# Patient Record
Sex: Female | Born: 1955 | Race: White | Hispanic: No | Marital: Single | Smoking: Never smoker
Health system: Southern US, Community
[De-identification: ages and names within clinical notes are randomized; demographics above are authoritative.]

## PROBLEM LIST (undated history)

## (undated) DIAGNOSIS — K5900 Constipation, unspecified: Secondary | ICD-10-CM

## (undated) DIAGNOSIS — M48061 Spinal stenosis, lumbar region without neurogenic claudication: Secondary | ICD-10-CM

## (undated) DIAGNOSIS — L719 Rosacea, unspecified: Secondary | ICD-10-CM

## (undated) DIAGNOSIS — F410 Panic disorder [episodic paroxysmal anxiety] without agoraphobia: Secondary | ICD-10-CM

## (undated) DIAGNOSIS — M47812 Spondylosis without myelopathy or radiculopathy, cervical region: Secondary | ICD-10-CM

## (undated) DIAGNOSIS — E785 Hyperlipidemia, unspecified: Secondary | ICD-10-CM

## (undated) DIAGNOSIS — E610 Copper deficiency: Secondary | ICD-10-CM

## (undated) DIAGNOSIS — K6389 Other specified diseases of intestine: Secondary | ICD-10-CM

## (undated) DIAGNOSIS — G43909 Migraine, unspecified, not intractable, without status migrainosus: Secondary | ICD-10-CM

## (undated) DIAGNOSIS — S92902A Unspecified fracture of left foot, initial encounter for closed fracture: Secondary | ICD-10-CM

## (undated) DIAGNOSIS — N882 Stricture and stenosis of cervix uteri: Secondary | ICD-10-CM

## (undated) DIAGNOSIS — M797 Fibromyalgia: Secondary | ICD-10-CM

## (undated) DIAGNOSIS — E039 Hypothyroidism, unspecified: Secondary | ICD-10-CM

## (undated) DIAGNOSIS — F32A Depression, unspecified: Secondary | ICD-10-CM

## (undated) DIAGNOSIS — D649 Anemia, unspecified: Secondary | ICD-10-CM

## (undated) DIAGNOSIS — Z87442 Personal history of urinary calculi: Secondary | ICD-10-CM

## (undated) DIAGNOSIS — N200 Calculus of kidney: Secondary | ICD-10-CM

## (undated) DIAGNOSIS — M1990 Unspecified osteoarthritis, unspecified site: Secondary | ICD-10-CM

## (undated) DIAGNOSIS — F419 Anxiety disorder, unspecified: Secondary | ICD-10-CM

## (undated) DIAGNOSIS — R42 Dizziness and giddiness: Secondary | ICD-10-CM

## (undated) DIAGNOSIS — F329 Major depressive disorder, single episode, unspecified: Secondary | ICD-10-CM

## (undated) DIAGNOSIS — K449 Diaphragmatic hernia without obstruction or gangrene: Secondary | ICD-10-CM

## (undated) DIAGNOSIS — K219 Gastro-esophageal reflux disease without esophagitis: Secondary | ICD-10-CM

## (undated) DIAGNOSIS — E7800 Pure hypercholesterolemia, unspecified: Secondary | ICD-10-CM

## (undated) HISTORY — DX: Calculus of kidney: N20.0

## (undated) HISTORY — DX: Gastro-esophageal reflux disease without esophagitis: K21.9

## (undated) HISTORY — DX: Migraine, unspecified, not intractable, without status migrainosus: G43.909

## (undated) HISTORY — PX: DILATION AND CURETTAGE OF UTERUS: SHX78

## (undated) HISTORY — DX: Fibromyalgia: M79.7

## (undated) HISTORY — DX: Panic disorder (episodic paroxysmal anxiety): F41.0

## (undated) HISTORY — DX: Constipation, unspecified: K59.00

## (undated) HISTORY — DX: Hypothyroidism, unspecified: E03.9

## (undated) HISTORY — DX: Spondylosis without myelopathy or radiculopathy, cervical region: M47.812

## (undated) HISTORY — DX: Stricture and stenosis of cervix uteri: N88.2

## (undated) HISTORY — DX: Pure hypercholesterolemia, unspecified: E78.00

## (undated) HISTORY — DX: Spinal stenosis, lumbar region without neurogenic claudication: M48.061

## (undated) HISTORY — DX: Anxiety disorder, unspecified: F41.9

## (undated) HISTORY — PX: NISSEN FUNDOPLICATION: SHX2091

## (undated) HISTORY — DX: Depression, unspecified: F32.A

## (undated) HISTORY — DX: Dizziness and giddiness: R42

## (undated) HISTORY — DX: Other specified diseases of intestine: K63.89

## (undated) HISTORY — DX: Rosacea, unspecified: L71.9

## (undated) HISTORY — PX: ABDOMINAL HYSTERECTOMY: SHX81

## (undated) HISTORY — DX: Unspecified fracture of left foot, initial encounter for closed fracture: S92.902A

## (undated) HISTORY — PX: EYE SURGERY: SHX253

## (undated) HISTORY — DX: Major depressive disorder, single episode, unspecified: F32.9

## (undated) HISTORY — PX: BONE MARROW BIOPSY: SHX199

## (undated) HISTORY — DX: Hyperlipidemia, unspecified: E78.5

## (undated) HISTORY — PX: CHOLECYSTECTOMY: SHX55

## (undated) HISTORY — PX: BREAST SURGERY: SHX581

## (undated) HISTORY — DX: Diaphragmatic hernia without obstruction or gangrene: K44.9

---

## 2000-12-26 ENCOUNTER — Ambulatory Visit (HOSPITAL_COMMUNITY): Admission: RE | Admit: 2000-12-26 | Discharge: 2000-12-26 | Payer: Self-pay | Admitting: Internal Medicine

## 2001-12-31 ENCOUNTER — Ambulatory Visit (HOSPITAL_COMMUNITY): Admission: RE | Admit: 2001-12-31 | Discharge: 2001-12-31 | Payer: Self-pay | Admitting: Internal Medicine

## 2005-09-04 ENCOUNTER — Ambulatory Visit: Payer: Self-pay | Admitting: Internal Medicine

## 2005-09-04 ENCOUNTER — Ambulatory Visit (HOSPITAL_COMMUNITY): Admission: RE | Admit: 2005-09-04 | Discharge: 2005-09-04 | Payer: Self-pay | Admitting: Internal Medicine

## 2005-09-14 ENCOUNTER — Ambulatory Visit: Payer: Self-pay | Admitting: Internal Medicine

## 2005-09-14 ENCOUNTER — Ambulatory Visit (HOSPITAL_COMMUNITY): Admission: RE | Admit: 2005-09-14 | Discharge: 2005-09-14 | Payer: Self-pay | Admitting: Internal Medicine

## 2005-09-20 ENCOUNTER — Ambulatory Visit: Payer: Self-pay | Admitting: Internal Medicine

## 2005-10-11 ENCOUNTER — Ambulatory Visit: Payer: Self-pay | Admitting: Internal Medicine

## 2005-10-12 ENCOUNTER — Ambulatory Visit (HOSPITAL_COMMUNITY): Admission: RE | Admit: 2005-10-12 | Discharge: 2005-10-12 | Payer: Self-pay | Admitting: Internal Medicine

## 2005-11-14 ENCOUNTER — Ambulatory Visit: Payer: Self-pay | Admitting: Internal Medicine

## 2006-09-03 ENCOUNTER — Ambulatory Visit (HOSPITAL_COMMUNITY): Admission: RE | Admit: 2006-09-03 | Discharge: 2006-09-03 | Payer: Self-pay | Admitting: Urology

## 2010-08-15 NOTE — H&P (Signed)
NAME:  Danielle Byrd, Danielle Byrd              ACCOUNT NO.:  [id number]   MEDICAL RECORD NO.:  [id number]          PATIENT TYPE:  AMB   LOCATION:  DAY                           FACILITY:  [facility]   PHYSICIAN:  Ky Barban, M.D.DATE OF BIRTH:  Oct 15, 1955   DATE OF ADMISSION:  09/03/2006  DATE OF DISCHARGE:  LH                              HISTORY & PHYSICAL   This patient is coming tomorrow as outpatient to have stone basket done.   CHIEF COMPLAINT:  Recurrent right flank pain.   HISTORY OF PRESENT ILLNESS:  This 55 year old female was seen by me  first on May 7 with recurrent right inguinal colic which started about 2-  1/2 weeks before she came to see me.  CT scan which was done on May 6  showed that she had a 4 mm stone in the right ureterovesical junction  causing some partial obstruction.  She also has a nonobstructing 2 to 3  mm stone in the left kidney.  She continued to have this pain on and off  on the right side and when she was seen by me again on May 28th she had  not passed a stone.  Although she was not having any pain on that day,  wanted me to go ahead and remove the stone.  She is still having some  backache and sometimes it feels like on the left side.  I told her that  it is confusing.  I cannot tell which side she is hurting - right or  left.  If the stone is still there, we may just go ahead and remove it.  I explained to her the procedure of stone basket holmium laser  lithotripsy.  She understands.  I also explained to her the  complications of ureteral perforation leading to open surgery of stone  migration so she is coming as outpatient and will remove the stone from  the left ureter if it is still there.  Past, is seizure one year ago,  hysterectomy 1997 for endometriosis.  No hypertension or diabetes.   PERSONAL HISTORY:  Does not smoke or drink.   REVIEW OF SYSTEMS:  Unremarkable.   PHYSICAL EXAMINATION:  GENERAL:  A well-nourished, well-developed  female.  VITAL SIGNS:  Blood pressure 102/67, temperature 98.2.  CENTRAL NERVOUS SYSTEM:  Negative.  HEENT:  Negative.  CHEST:  Symmetrical.  HEART:  Regular sinus rhythm.  No murmur.  ABDOMEN:  Soft, flat.  Liver, spleen, and kidneys are not palpable.  BACK:  No CV tenderness.  PELVIC:  No adnexal mass or tenderness.   IMPRESSION:  Left ureteral, right renal calculus.   PLAN:  Stone basket holmium laser lithotripsy with double-J stent on the  right side under anesthesia as outpatient.      Ky Barban, M.D.  Electronically Signed     MIJ/MEDQ  D:  09/02/2006  T:  09/02/2006  Job:  [id number]

## 2010-08-15 NOTE — Op Note (Signed)
NAME:  Danielle Byrd, Danielle Byrd              ACCOUNT NO.:  [id number]   MEDICAL RECORD NO.:  [id number]          PATIENT TYPE:  AMB   LOCATION:  DAY                           FACILITY:  [facility]   PHYSICIAN:  Ky Barban, M.D.DATE OF BIRTH:  02/13/1956   DATE OF PROCEDURE:  DATE OF DISCHARGE:                               OPERATIVE REPORT   PREOPERATIVE DIAGNOSES:  1. Right renal colic.  2. Right ureteral calculus.   POSTOPERATIVE DIAGNOSES:  1. No right ureteral calculus.  2. Urethral stenosis.   PROCEDURE:  1. Cystoscopy.  2. Dilation.  3. Right retrograde pyelogram.  4. Right ureteroscopy.   ANESTHESIA:  General.   PROCEDURE:  The patient was placed in the lithotomy position.  She was  prepped and draped.  A #25 cystoscope was attempted to be introduced but  she has urethral stenosis.  We dilated her to 22 [place].  The cystoscope  was introduced and the bladder was inspected and looks normal.  The  right ureteral orifice was catheterized with a wedge catheter.  Hypaque  was injected under fluoroscopic control. The dye went up into the upper  ureter but I do not see any definite stone.  There is some dilatation of  the lower ureter, so I decided to look into the ureter to make sure  there was no stone.  The guidewire was passed up into the renal pelvis.  Over the guidewire, a short rigid ureteroscope was introduced without  any difficulty.  The distal third and the right ureterovesical junction  were inspected.  No stone was seen.  All the instruments were removed.  The patient left the operating room in satisfactory condition.      Ky Barban, M.D.  Electronically Signed     MIJ/MEDQ  D:  09/03/2006  T:  09/03/2006  Job:  [id number]

## 2010-08-18 NOTE — Consult Note (Signed)
Danielle Byrd, Danielle Byrd              ACCOUNT NO.:  [id number]   MEDICAL RECORD NO.:  [id number]          PATIENT TYPE:  AMB   LOCATION:  DAY                           FACILITY:  [facility]   PHYSICIAN:  Lionel December, M.D.    DATE OF BIRTH:  10/17/1955   DATE OF CONSULTATION:  09/04/2005  DATE OF DISCHARGE:                                   CONSULTATION   REASON FOR CONSULTATION:  Possible choledocholithiasis.   HISTORY OF PRESENT ILLNESS:  Danielle Byrd is a 55 year old Caucasian female who is  referred through the courtesy of Dr. Erskine Speed of [place], [place].  Patient had laparoscopic cholecystectomy on Aug 24, 2005.  At that time she  presented with fever, rigors, neck and shoulder pain.  On CT, she was found  to have choledocholithiasis or perhaps evidence of acute cholecystitis.  She  had cholecystectomy on Aug 24, 2005, and was discharged on Aug 25, 2005.  She did well for a few days.  Six days ago she experienced an episode of  severe right upper quadrant pain associated with nausea.  She called Dr.  Brayton Caves office, she was asked to double up on her pain medicines.  She had  a few more episodes over the next day or two.  She was seen in the emergency  room at [facility] and had CT abdomen which revealed a small amount of fluid  collection in the gallbladder fossa.  She subsequently had hepatobiliary  scan which was negative for biliary leak.  Patient was discharged but she  kept having these episodes.  With the next episode, she was taken over to  [facility].  She had another CT.  Since they did not  have availability of MRCP, she was transferred to [facility] under care of Dr. Franky Macho who was covering for Dr. Gabriel Cirri.  Patient had MRCP yesterday which  showed left adrenal adenoma or myelolipoma, postoperative changes in  subcutaneous fat on the right side, a small amount of fluid collection in  gallbladder fossa and a filling defect in the distal bile duct indicative  of  a clot, debris or stone.  With this information, patient was referred for  ERCP.  She has had periodic lab studies.  Her LFTs from September 01, 2005,  revealed AST of 42, ALT of 60, her alkaline phosphatase was 303, bilirubin  was 0.6.  Her CBC was normal with a WBC of 6.2, H&H of 11.8 and 34, platelet  count was 274.   She presently is complaining of soreness of right upper quadrant.  She  states she has had 9 episodes all together.  She has not been able to eat  and has lost 6-8 pounds in the last 10 days.   Her usual meds at home include:  1.  Zyrtec 10 mg daily.  2.  Premarin 0.625 mg daily.  3.  Percocet one q.i.d. p.r.n.   PAST MEDICAL HISTORY:  1.  Chronic __________ 10 years ago by Dr. Erskine Speed.  2.  She had chronic low back pain presumably secondary to endometriosis.  3.  She  is status post hysterectomy.  4.  She also had bilateral eyelid surgery for entropion.  5.  She had normal colonoscopy 4 years ago.  6.  Recent laparoscopic cholecystectomy, as above.   ALLERGIES:  1.  ASPIRIN.  2.  PENICILLIN.  3.  CODEINE.  4.  VICODIN.   FAMILY HISTORY:  Father is 34 and has CAD and diabetes mellitus.  Mother in  84's, in good health.  She has one sister in good health.   SOCIAL HISTORY:  She is single.  She has never married and does not have any  children.  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   PHYSICAL EXAM:  A pleasant, well-developed, well-nourished Caucasian female  who does not appear to be in any acute distress.  She weighs 125 pounds.  She is 5 feet 5-1/2 inches tall.  Pulse 72 per minute, blood pressure  125/72, temperature is 97, respiratory rate is 20.  Conjunctiva is pink,  sclera is nonicteric.  Oropharyngeal mucosa is normal.  No neck masses or  thyromegaly noted.  CARDIAC EXAM:  With a regular rhythm, normal S1 and S2.  No murmur or gallop  noted.  LUNGS:  Are  clear to auscultation.  ABDOMEN:  Is symmetrical, she has some bruising around laparoscopy scars.  Bowel sounds are normal.  On palpation, soft abdomen.  She has mild  tenderness of right upper quadrant but deep palpation was not performed.  No  organomegaly or masses noted.  RECTAL EXAM:  Is deferred.  No clubbing or edema noted.   CBC from this morning:  WBC is 4.9, H&H 11 and 32, platelet count is 296K.  Her electrolytes are normal, potassium is 3.8.   LFTs:  Bilirubin is 0.6, AP 213, AST 22, ALT 28, total protein 5.7 with  albumin of 9.9, INR 1.2 and PTT is 29.1.   ASSESSMENT:  Danielle Byrd is a 55 year old Caucasian female with recurrent right  upper quadrant pain with elevated alkaline phosphatase who also had mildly  elevated transaminases but they are down now.  She is status post lap chole  on Aug 24, 2005.  MRCP shows a filling defect in distal bile duct.  This is  most likely a stone but could be sludge or a clot.  Nevertheless, her  presentation warrants further evaluation with ERCP and sphincterotomy.   RECOMMENDATIONS:  ERCP with sphincterotomy and stone and debris extraction.  I have reviewed the procedure risk with the patient and she is agreeable.   She will be typed and crossmatched for 2 units and will be given a single  dose of Levaquin 500 mg IV.   Hopefully she will be able to go home this afternoon.   We would like to thank Dr. Gabriel Cirri for the opportunity to participate in the  care of this nice lady.      Lionel December, M.D.  Electronically Signed     NR/MEDQ  D:  09/04/2005  T:  09/04/2005  Job:  [id number]

## 2010-08-18 NOTE — Op Note (Signed)
NAME:  Danielle Byrd, Danielle Byrd              ACCOUNT NO.:  [id number]   MEDICAL RECORD NO.:  [id number]          PATIENT TYPE:  AMB   LOCATION:  DAY                           FACILITY:  [facility]   PHYSICIAN:  Lionel December, M.D.    DATE OF BIRTH:  May 09, 1955   DATE OF PROCEDURE:  DATE OF DISCHARGE:                                 OPERATIVE REPORT   PROCEDURE:  ERCP with sphincterotomy and stone extraction.   INDICATION:  [name] is 55 year old Caucasian female who is status post lap  chole on 08/24/2005 and has been experiencing episodic right upper quadrant  pain.  MRCP reveals one, possibly two stones in CBD.  She is here for  therapeutic procedure.  Procedure risks were reviewed with the patient,  informed consent was obtained.   PREOP MEDICATION:  Please see anesthesia record for details.   FINDINGS:  Procedure performed in OR.  After the patient was placed under  anesthesia, she was intubated and positioned in semi prone position.  Olympus video duodenoscope was passed via oropharynx without any difficulty  into esophagus, stomach, across the pylorus into bulb and descending  duodenum.  Ampulla of Vater was normal.  CBD was selectively cannulated  using RX 44 autotome and 0.035 Hydra Jagwire.  Pancreatic duct was not  filled or cannulated.  Dilute contrast was injected into the bile duct.  There were two large filling defects at the distal CBD.  There was no  extravasation of contrast from the cystic duct remnant.  Intrahepatic  radicals were normal.  CBD and CHD measured 6 to 7 mm.  Sphincterotomy was  performed.  Dormia basket was passed into bile duct but would not open up  fully.  Therefore the stones were retrieved using balloon.  Pictures taken  for the record.  Endoscope was withdrawn.  The patient tolerated the  procedure well and she is in the process of being extubated.   FINAL DIAGNOSIS:  Two 5-6 mm stones  removed from common bile duct following  endoscopic sphincterotomy.   PD was not studied.   PLAN:  Clear liquids when she is alert.  She possibly will be going home  this afternoon.   She is to follow with Dr. Gabriel Cirri next week when she will have LFTs.      Lionel December, M.D.  Electronically Signed    NR/MEDQ  D:  09/04/2005  T:  09/05/2005  Job:  [id number]   cc:   Erskine Speed, M.D.  Fax: [phone]

## 2010-08-18 NOTE — Op Note (Signed)
   NAME:  Danielle Byrd, Danielle Byrd                       ACCOUNT NO.:  [id number]   MEDICAL RECORD NO.:  [id number]                   PATIENT TYPE:   LOCATION:                                       FACILITY:   PHYSICIAN:  Lionel December, M.D.                 DATE OF BIRTH:   DATE OF PROCEDURE:  12/31/2001  DATE OF DISCHARGE:                                 OPERATIVE REPORT   PROCEDURE:  Esophageal pH study.   INDICATIONS FOR PROCEDURE:  The patient came to this facility on 12/31/2001  for ambulatory pH study.  A pH probe was placed into the esophagus per  protocol.  Complete manometry was not performed as she had one last year.  The patient was supposed to come back after 24 hours.  She returned within  three hours and asked that the catheter be removed.   This is therefore an incomplete study in that no conclusions could be drawn  from it.                                               Lionel December, M.D.    NR/MEDQ  D:  03/03/2002  T:  03/03/2002  Job:  [id number]

## 2011-01-11 ENCOUNTER — Other Ambulatory Visit (INDEPENDENT_AMBULATORY_CARE_PROVIDER_SITE_OTHER): Payer: Self-pay | Admitting: Internal Medicine

## 2011-01-11 ENCOUNTER — Encounter (INDEPENDENT_AMBULATORY_CARE_PROVIDER_SITE_OTHER): Payer: Self-pay | Admitting: Internal Medicine

## 2011-01-11 ENCOUNTER — Ambulatory Visit (INDEPENDENT_AMBULATORY_CARE_PROVIDER_SITE_OTHER): Payer: BC Managed Care – PPO | Admitting: Internal Medicine

## 2011-01-11 DIAGNOSIS — N200 Calculus of kidney: Secondary | ICD-10-CM

## 2011-01-11 DIAGNOSIS — R1011 Right upper quadrant pain: Secondary | ICD-10-CM

## 2011-01-11 DIAGNOSIS — R1012 Left upper quadrant pain: Secondary | ICD-10-CM

## 2011-01-11 NOTE — Patient Instructions (Signed)
Patient advised if pain increases to go to the ED.  Preferable Danielle Byrd

## 2011-01-11 NOTE — Progress Notes (Signed)
Subjective:     Patient ID: Danielle Byrd, female   DOB: [birth year], 55 y.o.   MRN: [id number] Danielle Byrd presents today as an urgent office visit. She is having pain in her rt flank.  Pain started last night. She describes the pain as constant. She says her abdomen is swollen. No fever.  It feels like a lot of pressure. She says she has not had this pain before. She does have a hx of kidney stones. Her last kidney stones was in 2009.  Her last BM was yesterday at lunch and was normal.  She usually has a BM about every 4-5 days. Its hurts to move. She is usually seen in our office for acid reflux and she has had an ERCP with spincterotomy in the past for stones in the CBD in2007. She says this is not the same type of pain. It is not as severe.  She says she has blisters in the top of her mouth. Rates pain 7/10.   HPI     Review of Systems  See hpi     Current Outpatient Prescriptions  Medication Sig Dispense Refill  . ALPRAZolam (XANAX) 0.25 MG tablet Take 0.25 mg by mouth at bedtime as needed.        . Biotin 7500 MCG TABS Take by mouth.        . CASCARA SAGRADA PO Take by mouth as needed.        . cholecalciferol (VITAMIN D) 400 UNITS TABS Take 1,000 Units by mouth 2 (two) times daily.        . cycloSPORINE (RESTASIS) 0.05 % ophthalmic emulsion 1 drop 2 (two) times daily.        . DULoxetine (CYMBALTA) 30 MG capsule Take 30 mg by mouth daily.        . Flaxseed, Linseed, 1000 MG CAPS Take by mouth.        . Ginger, Zingiber officinalis, (GINGER ROOT) 550 MG CAPS Take 2 each by mouth.        Danielle Byrd Oil 500 MG CAPS Take by mouth.        . levothyroxine (SYNTHROID, LEVOTHROID) 25 MCG tablet Take 25 mcg by mouth daily.        Marland Kitchen oxyCODONE-acetaminophen (PERCOCET) 10-325 MG per tablet Take 1 tablet by mouth every 6 (six) hours as needed.        . RABEprazole (ACIPHEX) 20 MG tablet Take 20 mg by mouth daily.        . traZODone (DESYREL) 50 MG tablet Take 25 mg by mouth at bedtime.        .  vitamin C (ASCORBIC ACID) 500 MG tablet Take 500 mg by mouth daily.         Past Surgical History  Procedure Date  . Cholecystectomy   . Abdominal hysterectomy    Past Medical History  Diagnosis Date  . High cholesterol   . Kidney stones   . Hypothyroidism    Allergies  Allergen Reactions  . Statins    History   Social History Narrative  . No narrative on file   . History   Social History  . Marital Status: Single    Spouse Name: N/A    Number of Children: N/A  . Years of Education: N/A   Occupational History  . Not on file.   Social History Main Topics  . Smoking status: Never Smoker   . Smokeless tobacco: Not on file  . Alcohol Use: No  .  Drug Use: No  . Sexually Active: Not on file   Other Topics Concern  . Not on file   Social History Narrative  . No narrative on file   No family history on file.  Objective:   Physical Exam  Filed Vitals:   01/11/11 1535  BP: 108/76  Pulse: 72  Temp: 98.5 F (36.9 C)  Height: 5' 5.5" (1.664 m)    Alert and oriented. Skin warm and dry. Oral mucosa is moist. Natural teeth in good condition. Sclera anicteric, conjunctivae is pink. Thyroid not enlarged. No cervical lymphadenopathy. Lungs clear. Heart regular rate and rhythm.  Abdomen is soft. Bowel sounds are positive. No hepatomegaly. No abdominal masses felt. No tenderness.  No edema to lower extremities. Patient is alert and oriented.      Assessment:     Rt upper quadrant pain. Need to rule out stone in the CBD.  This could also be a kidney stone given her history.    Plan:    CBC, C-met, US abdomen for rt upper quadrant pain.  I advised patient if pain becomes intense to go to the ED this evening. I will call her with the results as soon as I get them.

## 2011-01-12 ENCOUNTER — Telehealth (INDEPENDENT_AMBULATORY_CARE_PROVIDER_SITE_OTHER): Payer: Self-pay | Admitting: Internal Medicine

## 2011-01-12 ENCOUNTER — Ambulatory Visit (HOSPITAL_COMMUNITY)
Admission: RE | Admit: 2011-01-12 | Discharge: 2011-01-12 | Disposition: A | Discharge status: Home / Self Care | Payer: BC Managed Care – PPO | Source: Ambulatory Visit | Attending: Internal Medicine | Admitting: Internal Medicine

## 2011-01-12 DIAGNOSIS — R1011 Right upper quadrant pain: Secondary | ICD-10-CM | POA: Insufficient documentation

## 2011-01-12 DIAGNOSIS — R1012 Left upper quadrant pain: Secondary | ICD-10-CM

## 2011-01-12 LAB — CBC WITH DIFFERENTIAL/PLATELET
Basophils Absolute: 0 10*3/uL (ref 0.0–0.1)
Basophils Relative: 0 % (ref 0–1)
Eosinophils Absolute: 0.2 10*3/uL (ref 0.0–0.7)
HCT: 32.3 % — ABNORMAL LOW (ref 36.0–46.0)
Hemoglobin: 11 g/dL — ABNORMAL LOW (ref 12.0–15.0)
Lymphs Abs: 2.2 10*3/uL (ref 0.7–4.0)
MCH: 33.6 pg (ref 26.0–34.0)
MCHC: 34.1 g/dL (ref 30.0–36.0)
MCV: 98.8 fL (ref 78.0–100.0)
Monocytes Relative: 6 % (ref 3–12)
Platelets: 226 10*3/uL (ref 150–400)

## 2011-01-12 LAB — COMPREHENSIVE METABOLIC PANEL
ALT: 14 U/L (ref 0–35)
AST: 23 U/L (ref 0–37)
Albumin: 4.5 g/dL (ref 3.5–5.2)
Alkaline Phosphatase: 82 U/L (ref 39–117)
Calcium: 8.9 mg/dL (ref 8.4–10.5)
Creat: 0.77 mg/dL (ref 0.50–1.10)
Sodium: 142 mEq/L (ref 135–145)
Total Bilirubin: 0.3 mg/dL (ref 0.3–1.2)
Total Protein: 6.3 g/dL (ref 6.0–8.3)

## 2011-01-12 NOTE — Progress Notes (Signed)
Addended by: Len Blalock on: 01/12/2011 09:28 AM   Modules accepted: Orders

## 2011-01-12 NOTE — Progress Notes (Signed)
Addended by: Len Blalock on: 01/12/2011 09:47 AM   Modules accepted: Orders

## 2011-01-12 NOTE — Telephone Encounter (Signed)
Patient needs MRCP abdomen with and without CM

## 2011-01-15 ENCOUNTER — Telehealth (INDEPENDENT_AMBULATORY_CARE_PROVIDER_SITE_OTHER): Payer: Self-pay | Admitting: *Deleted

## 2011-01-15 NOTE — Telephone Encounter (Signed)
Patient left mess. on machine Friday afternoon stating you were to call her back before you left on Friday about what she needed to have next, please call [phone],

## 2011-01-16 ENCOUNTER — Telehealth (INDEPENDENT_AMBULATORY_CARE_PROVIDER_SITE_OTHER): Payer: Self-pay | Admitting: Internal Medicine

## 2011-01-16 NOTE — Progress Notes (Signed)
Results have been given to patient 

## 2011-01-16 NOTE — Telephone Encounter (Signed)
I have given results to patient. MRI abdomen

## 2011-01-16 NOTE — Progress Notes (Signed)
Results given to patient

## 2011-01-18 ENCOUNTER — Other Ambulatory Visit (INDEPENDENT_AMBULATORY_CARE_PROVIDER_SITE_OTHER): Payer: Self-pay | Admitting: Internal Medicine

## 2011-01-18 ENCOUNTER — Ambulatory Visit (HOSPITAL_COMMUNITY)
Admission: RE | Admit: 2011-01-18 | Discharge: 2011-01-18 | Disposition: A | Discharge status: Home / Self Care | Payer: BC Managed Care – PPO | Source: Ambulatory Visit | Attending: Internal Medicine | Admitting: Internal Medicine

## 2011-01-18 DIAGNOSIS — Z9089 Acquired absence of other organs: Secondary | ICD-10-CM | POA: Insufficient documentation

## 2011-01-18 DIAGNOSIS — R1011 Right upper quadrant pain: Secondary | ICD-10-CM | POA: Insufficient documentation

## 2011-01-18 MED ORDER — GADOBENATE DIMEGLUMINE 529 MG/ML IV SOLN
11.0000 mL | Freq: Once | INTRAVENOUS | Status: AC | PRN
  Administered 2011-01-18: 11 mL via INTRAVENOUS

## 2011-01-22 ENCOUNTER — Telehealth (INDEPENDENT_AMBULATORY_CARE_PROVIDER_SITE_OTHER): Payer: Self-pay | Admitting: *Deleted

## 2011-01-22 NOTE — Telephone Encounter (Signed)
She said you increased her Aciphex to 2 a day and this will make her run out of her current medication on Wednesday.  Also she is having a lot of burping and reflux and wants to talk to you.  Please call her back today

## 2011-01-23 NOTE — Telephone Encounter (Signed)
Will leave samples of Aciphex BID tomorrow. X 2 weeks.

## 2011-02-01 ENCOUNTER — Telehealth (INDEPENDENT_AMBULATORY_CARE_PROVIDER_SITE_OTHER): Payer: Self-pay | Admitting: Internal Medicine

## 2011-02-01 NOTE — Telephone Encounter (Signed)
Called to report only. Please return the call to [phone].

## 2011-02-05 ENCOUNTER — Telehealth (INDEPENDENT_AMBULATORY_CARE_PROVIDER_SITE_OTHER): Payer: Self-pay | Admitting: Internal Medicine

## 2011-02-05 NOTE — Telephone Encounter (Signed)
3 mth f/u w/ NUR noted in computer

## 2011-02-05 NOTE — Telephone Encounter (Signed)
She says she feels better. She now occasionally has epigastric pain. I advised her to take the Aciphex once a day now. She will need an office visit in 3 months with Dr. Karilyn Cota.  Ann, OV in 3 months with Dr. Karilyn Cota.

## 2011-04-06 ENCOUNTER — Encounter (INDEPENDENT_AMBULATORY_CARE_PROVIDER_SITE_OTHER): Payer: Self-pay | Admitting: *Deleted

## 2011-05-14 ENCOUNTER — Ambulatory Visit (INDEPENDENT_AMBULATORY_CARE_PROVIDER_SITE_OTHER): Payer: 59 | Admitting: Internal Medicine

## 2011-05-14 ENCOUNTER — Encounter (INDEPENDENT_AMBULATORY_CARE_PROVIDER_SITE_OTHER): Payer: Self-pay | Admitting: Internal Medicine

## 2011-05-14 VITALS — BP 110/68 | HR 74 | Temp 99.4°F | Resp 12 | Ht 65.0 in | Wt 119.1 lb

## 2011-05-14 DIAGNOSIS — R1012 Left upper quadrant pain: Secondary | ICD-10-CM

## 2011-05-14 DIAGNOSIS — R109 Unspecified abdominal pain: Secondary | ICD-10-CM

## 2011-05-14 DIAGNOSIS — R1011 Right upper quadrant pain: Secondary | ICD-10-CM

## 2011-05-14 DIAGNOSIS — K5900 Constipation, unspecified: Secondary | ICD-10-CM | POA: Insufficient documentation

## 2011-05-14 DIAGNOSIS — Z8739 Personal history of other diseases of the musculoskeletal system and connective tissue: Secondary | ICD-10-CM | POA: Insufficient documentation

## 2011-05-14 DIAGNOSIS — K219 Gastro-esophageal reflux disease without esophagitis: Secondary | ICD-10-CM

## 2011-05-14 DIAGNOSIS — E785 Hyperlipidemia, unspecified: Secondary | ICD-10-CM

## 2011-05-14 MED ORDER — LINACLOTIDE 145 MCG PO CAPS: 145.0000 ug | ORAL_CAPSULE | Freq: Every day | ORAL | Status: DC

## 2011-05-14 MED ORDER — PANTOPRAZOLE SODIUM 40 MG PO TBEC: 40.0000 mg | DELAYED_RELEASE_TABLET | Freq: Two times a day (BID) | ORAL | Status: DC

## 2011-05-14 MED ORDER — PANTOPRAZOLE SODIUM 40 MG PO TBEC: 40.0000 mg | DELAYED_RELEASE_TABLET | Freq: Every day | ORAL | Status: DC

## 2011-05-14 NOTE — Progress Notes (Signed)
Presenting complaint; Followup for abdominal pain and GERD. Subjective: Danielle Byrd is 56 year old Caucasian female who returns for follow up to discuss her GI issues. She was last seen 4 months ago by Ms. Terri Setzer. She had normal LFTs, normal hepatobiliary ultrasound normal MRCP. She had common duct stone removed in 2007. She continues to complain of pain in right as well as left upper quadrant. Pain occurs 2-3 times a week and describes as sharp knifelike pain lasting no more than couple of seconds. Pain is not associated with nausea, vomiting or hematuria. Pain is not as intense as it was before. She also complains of constipation in spite of taking medications. She denies melena or rectal bleeding. She has a good appetite. Every now and then she takes Correctol 2 tablets and she has good results. Senna is not working as it used to. She takes pain medication occasionally for her back pain or fibromyalgia. Patient states that AcipHex is working however her co-pay for 30 doses is now $85. She wants to try alternatives. Prilosec has not worked in the past the Current Medications: Current Outpatient Prescriptions  Medication Sig Dispense Refill  . ALPRAZolam (XANAX) 0.25 MG tablet Take 0.25 mg by mouth 2 (two) times daily.       . Biotin 7500 MCG TABS Take by mouth.        . CASCARA SAGRADA PO Take by mouth as needed.        . cholecalciferol (VITAMIN D) 400 UNITS TABS Take 2,000 Units by mouth daily.       . cycloSPORINE (RESTASIS) 0.05 % ophthalmic emulsion 1 drop 2 (two) times daily.        . DULoxetine (CYMBALTA) 30 MG capsule Take 60 mg by mouth daily.       . Flaxseed, Linseed, 1000 MG CAPS Take 1,200 mg by mouth.       [name] Oil 500 MG CAPS Take 1,500 mg by mouth.       . levothyroxine (SYNTHROID, LEVOTHROID) 25 MCG tablet Take 25 mcg by mouth daily.        . Menthol 3.1 % GEL Apply topically. Patient uses a roll on Perform pain relieving, form of this Menthol      . oxyCODONE-acetaminophen  (PERCOCET) 10-325 MG per tablet Take 1 tablet by mouth every 6 (six) hours as needed.        . RABEprazole (ACIPHEX) 20 MG tablet Take 20 mg by mouth daily.        . traZODone (DESYREL) 50 MG tablet Take 50 mg by mouth at bedtime.       . vitamin C (ASCORBIC ACID) 500 MG tablet Take 500 mg by mouth daily.          Objective: Blood pressure 110/68, pulse 74, temperature 99.4 F (37.4 C), temperature source Oral, resp. rate 12, height 5\' 5"  (1.651 m), weight 119 lb 1.6 oz (54.023 kg). Conjunctiva is pink. Sclera is nonicteric Oropharyngeal mucosa is normal. No neck masses or thyromegaly noted. Cardiac exam with regular rhythm normal S1 and S2. No murmur or gallop noted. Lungs are clear to auscultation. Abdomen is symmetrical. Bowel sounds are normal. On palpation soft abdomen without tenderness organomegaly or masses. No tenderness noted at the renal angles.  No LE edema or clubbing noted.  Assessment; #1. Bilateral upper quadrant abdominal pain features to suggest neuropathic pain. His pain does not appear to be biliary or GI pain. Unless pain gets worse we'll not pursue with further workup. #2. Chronic  GERD. AcipHex is working but cost is an issue. Will try her on another PPI. #3. Constipation. Current therapy is not working.  Plan; Discontinue AcipHex and senna Pantoprazole 40 mg by mouth twice a day. Linaclotide 145 mcg by mouth daily. 30 day supply given. Progress report in 4 weeks. Will consider repeating her LFTs should she experience right upper quadrant pain lasting more than 15 minutes. Office visit in one year.

## 2011-05-14 NOTE — Patient Instructions (Addendum)
Call with progress report in 4 weeks regarding constipation. Notify if you an abdominal pain lasting more than 15 minutes.

## 2011-05-24 ENCOUNTER — Telehealth (INDEPENDENT_AMBULATORY_CARE_PROVIDER_SITE_OTHER): Payer: Self-pay | Admitting: *Deleted

## 2011-05-24 NOTE — Telephone Encounter (Signed)
Danielle Byrd called to make [place] aware that when she got her Pantoprazole 40 mg it was written that she take 1 a day. At the time of her office visit 05-14-11, Dr. Karilyn Cota e-scribed Pantoprazole 40 mg take 1 by mouth twice a day #60 with 5 refills. I called her pharmacy and spoke with the pharmacist Janese Banks , and gave him a verbal with the correct sig. The patient was called and made aware.

## 2011-06-20 ENCOUNTER — Telehealth (INDEPENDENT_AMBULATORY_CARE_PROVIDER_SITE_OTHER): Payer: Self-pay | Admitting: *Deleted

## 2011-06-20 NOTE — Telephone Encounter (Signed)
Danielle Byrd called with a progress report - She says that the medication that she was given for Constipation did not help , and the Protonix was cheaper but she went back on the Aciphex because it worked better. She said to give her a call if Dr. Karilyn Cota wanted to do any thing else.

## 2011-06-20 NOTE — Telephone Encounter (Signed)
Danielle Byrd,please let patient know that she can go back on laxative like she has done before. Other option would be Amitiza if she wants to try it. Linaclotide did not work

## 2011-06-21 NOTE — Telephone Encounter (Signed)
I spoke with the patient and she will come by and get samples of the Amitiza to try, stated it may be next week (Tuesday).

## 2011-07-10 ENCOUNTER — Other Ambulatory Visit (INDEPENDENT_AMBULATORY_CARE_PROVIDER_SITE_OTHER): Payer: Self-pay | Admitting: Internal Medicine

## 2011-07-10 ENCOUNTER — Telehealth (INDEPENDENT_AMBULATORY_CARE_PROVIDER_SITE_OTHER): Payer: Self-pay | Admitting: *Deleted

## 2011-07-10 NOTE — Telephone Encounter (Signed)
Will change her dose to 25 mcg by mouth daily

## 2011-07-10 NOTE — Progress Notes (Signed)
Patient is actually on linzess. I would like for to continue present dose for another month.

## 2011-07-10 NOTE — Telephone Encounter (Signed)
Danielle Byrd left a message that the Amitiza 8 mcg taking 1 twice a ay is the best thing she has tried to date. She would like to have a prescription sent to her Pharmacy, she says while it is the best thing so far there are still times she feels that she has to go and cannot. She ask if the doseage should be increased? She may be reached on her cell [phone].

## 2011-07-11 ENCOUNTER — Telehealth (INDEPENDENT_AMBULATORY_CARE_PROVIDER_SITE_OTHER): Payer: Self-pay | Admitting: *Deleted

## 2011-07-11 DIAGNOSIS — K5909 Other constipation: Secondary | ICD-10-CM

## 2011-07-11 MED ORDER — LUBIPROSTONE 8 MCG PO CAPS: 8.0000 ug | ORAL_CAPSULE | Freq: Two times a day (BID) | ORAL | Status: AC

## 2011-07-11 NOTE — Telephone Encounter (Signed)
[facility] Amitiza 8 mcg capsule, take one capsule by mouth twice a day.

## 2011-07-11 NOTE — Telephone Encounter (Signed)
I talked with the patient and she is going to take Amitiza 2 of the 8 mcg at 1 time verses taking it in the morning and the evening. [facility] [facility] ,Amitiza 8 mcg take 1 by mouth twice a day #60- 6 refills per Dr.Rehman. Bonita Quin was made aware.

## 2011-07-30 ENCOUNTER — Telehealth (INDEPENDENT_AMBULATORY_CARE_PROVIDER_SITE_OTHER): Payer: Self-pay | Admitting: *Deleted

## 2011-07-30 DIAGNOSIS — K219 Gastro-esophageal reflux disease without esophagitis: Secondary | ICD-10-CM

## 2011-07-30 NOTE — Telephone Encounter (Signed)
[facility] [place] [facility] a refill on Aciphex 20 mg, take 1 tablet every day.

## 2011-07-31 ENCOUNTER — Telehealth (INDEPENDENT_AMBULATORY_CARE_PROVIDER_SITE_OTHER): Payer: Self-pay | Admitting: *Deleted

## 2011-07-31 DIAGNOSIS — Z8719 Personal history of other diseases of the digestive system: Secondary | ICD-10-CM

## 2011-07-31 NOTE — Telephone Encounter (Signed)
[facility] on Aciphex 20 mg, take 1 tablet every day.

## 2011-08-01 ENCOUNTER — Telehealth (INDEPENDENT_AMBULATORY_CARE_PROVIDER_SITE_OTHER): Payer: Self-pay | Admitting: Internal Medicine

## 2011-08-01 NOTE — Telephone Encounter (Signed)
It looks like from the records, that Aciphex was d/c and protonix was started.

## 2011-08-02 ENCOUNTER — Telehealth (INDEPENDENT_AMBULATORY_CARE_PROVIDER_SITE_OTHER): Payer: Self-pay | Admitting: *Deleted

## 2011-08-02 NOTE — Telephone Encounter (Signed)
Patient's pharmacy called. The patient had been put on Protonix 40 mg BID,however this did not work for [employer]. She called several weeks a go to make [place] aware that she wanted to go back on the Aciphex as it worked better for her. Dr.Rehman okayed this.  Pharmacy will refill the Aciphex 20 mg patient to take 1 a day #30 with 11 refills.Bonita Quin called and made aware.

## 2011-08-02 NOTE — Telephone Encounter (Signed)
Patient lm stating she called pharmacy for refill on aciphex and ws told NUR refused due to another med she is taking, she is confused please call

## 2011-08-03 ENCOUNTER — Telehealth (INDEPENDENT_AMBULATORY_CARE_PROVIDER_SITE_OTHER): Payer: Self-pay | Admitting: *Deleted

## 2011-08-03 NOTE — Telephone Encounter (Signed)
Patient called and states that the Amitiza 8 mcg taking 2 at the same time, was working but now not as well. She ask if there is anything else that she can take. Explains that she has to strain and does not feel that she has emptied well.. I told her that this would be addressed by Dr.Rehman when he returned May 13 th and we would cal her back

## 2011-08-03 NOTE — Telephone Encounter (Signed)
PA has already been done, pharmacy tried to refill it to early.

## 2011-08-03 NOTE — Telephone Encounter (Signed)
Patient lm stating her pharmacy told her her amitiza 8 mg needed PA, please call her, she thinks this has already been done

## 2011-08-07 MED ORDER — RABEPRAZOLE SODIUM 20 MG PO TBEC: 20.0000 mg | DELAYED_RELEASE_TABLET | Freq: Every day | ORAL | Status: DC

## 2011-08-10 MED ORDER — RABEPRAZOLE SODIUM 20 MG PO TBEC: 20.0000 mg | DELAYED_RELEASE_TABLET | Freq: Every day | ORAL | Status: DC

## 2011-08-15 NOTE — Telephone Encounter (Signed)
Patient advised to increase Amitiza to 24 mcg by mouth daily. Tammy has already called patient.

## 2011-08-15 NOTE — Telephone Encounter (Signed)
Per Dr.Rehman , patient may try the Linzess 290 mcg , 1 by mouth daily.  If this works it will be okay to call in a prescription, Patient called and made aware.

## 2011-09-13 ENCOUNTER — Other Ambulatory Visit (INDEPENDENT_AMBULATORY_CARE_PROVIDER_SITE_OTHER): Payer: Self-pay | Admitting: *Deleted

## 2011-09-13 NOTE — Telephone Encounter (Signed)
Danielle Byrd called and ask that we call in a prescription for the Linzess 290 mcg. Take 1 by mouth 30 minutes before breakfast on a empty stomach.1 month 11 refills. Patient states that this is really working for her. The above prescription per approval by Delrae Rend [facility]. Patient was called and made aware.

## 2011-09-17 ENCOUNTER — Encounter (INDEPENDENT_AMBULATORY_CARE_PROVIDER_SITE_OTHER): Payer: Self-pay | Admitting: *Deleted

## 2011-09-26 NOTE — Telephone Encounter (Signed)
This encounter was created in error - please disregard.

## 2011-09-28 ENCOUNTER — Telehealth (INDEPENDENT_AMBULATORY_CARE_PROVIDER_SITE_OTHER): Payer: Self-pay | Admitting: *Deleted

## 2011-09-28 NOTE — Telephone Encounter (Signed)
Will look at records first.

## 2011-09-28 NOTE — Telephone Encounter (Signed)
Danielle Byrd called and would like to know if Dr.Rehman would refer her to another Physician. She has been seeing a Press photographer in [place] for her low WBC count, last seen on 09/19/2011 and WBC was 2.8. She is very concerned and says that it keeps getting lower and lower. She is going to fax results for Dr.Rehman to look at .

## 2011-10-17 ENCOUNTER — Encounter (HOSPITAL_COMMUNITY): Payer: 59 | Attending: Oncology | Admitting: Oncology

## 2011-10-17 ENCOUNTER — Encounter (HOSPITAL_COMMUNITY): Payer: Self-pay | Admitting: Oncology

## 2011-10-17 VITALS — BP 110/74 | HR 86 | Temp 98.4°F | Ht 63.5 in | Wt 118.0 lb

## 2011-10-17 DIAGNOSIS — D72819 Decreased white blood cell count, unspecified: Secondary | ICD-10-CM

## 2011-10-17 DIAGNOSIS — D539 Nutritional anemia, unspecified: Secondary | ICD-10-CM

## 2011-10-17 LAB — CBC
HCT: 33.5 % — ABNORMAL LOW (ref 36.0–46.0)
Platelets: 201 10*3/uL (ref 150–400)
RBC: 3.45 MIL/uL — ABNORMAL LOW (ref 3.87–5.11)
RDW: 12.1 % (ref 11.5–15.5)
WBC: 3.1 10*3/uL — ABNORMAL LOW (ref 4.0–10.5)

## 2011-10-17 LAB — DIFFERENTIAL
Basophils Absolute: 0 10*3/uL (ref 0.0–0.1)
Eosinophils Relative: 4 % (ref 0–5)
Lymphocytes Relative: 58 % — ABNORMAL HIGH (ref 12–46)
Monocytes Relative: 8 % (ref 3–12)

## 2011-10-17 LAB — COMPREHENSIVE METABOLIC PANEL
ALT: 12 U/L (ref 0–35)
AST: 22 U/L (ref 0–37)
Albumin: 3.9 g/dL (ref 3.5–5.2)
Alkaline Phosphatase: 87 U/L (ref 39–117)
Chloride: 102 mEq/L (ref 96–112)
Potassium: 3.8 mEq/L (ref 3.5–5.1)
Total Bilirubin: 0.4 mg/dL (ref 0.3–1.2)

## 2011-10-17 LAB — LACTATE DEHYDROGENASE: LDH: 256 U/L — ABNORMAL HIGH (ref 94–250)

## 2011-10-17 NOTE — Patient Instructions (Addendum)
JEMMA RASP  [id number] April 26, 1955 Dr. Glenford Peers   [facility]  Discharge Instructions  RECOMMENDATIONS MADE BY THE CONSULTANT AND ANY TEST RESULTS WILL BE SENT TO YOUR REFERRING DOCTOR.   EXAM FINDINGS BY MD TODAY AND SIGNS AND SYMPTOMS TO REPORT TO CLINIC OR PRIMARY MD: Exam and discussion per MD.  Need to do a bone marrow biopsy and aspiration and an ultrasound to check for your spleen size.  We will try to do all of this on 7/24.  MEDICATIONS PRESCRIBED: Take two of your percocet and two of your xanax tablets 1 1/2 hours before you come for the procedure.   INSTRUCTIONS GIVEN AND DISCUSSED: Other :  Plan to be here about 1 1/2 hours for the bone marrow biopsy and about 30 - 45 minutes for the ultrasound.  SPECIAL INSTRUCTIONS/FOLLOW-UP: Return to Clinic on 7/24 at 8:30 am.   I acknowledge that I have been informed and understand all the instructions given to me and received a copy. I do not have any more questions at this time, but understand that I may call the Specialty Clinic at [facility] at [phone] during business hours should I have any further questions or need assistance in obtaining follow-up care.    __________________________________________  _____________  __________ Signature of Patient or Authorized Representative            Date                   Time    __________________________________________ Nurse's Signature  Consent obtained for Bone Marrow Biopsy and Aspiration.

## 2011-10-17 NOTE — Progress Notes (Signed)
Problem #1 macrocytic anemia and leukopenia unclear as to etiology though we need to rule out myelodysplastic syndrome  Problem #2 history of cholecystectomy problem #3 history of gastric fundoplication for reflux without improvement problem #4 right breast biopsy for benign disease many years ago problem #5 history of fibromyalgia problem #6 history of endometriosis treated by TAH and BSO  Pleasant 56 year old single woman who for several years has had mild anemia which is macrocytic in association with leukopenia and neutropenia. She has had a tremendous workup by her physician in [place] other than she has not had a bone marrow aspirate and biopsy. More recently she has been on B12 shots every other week since approximately February of this year prior to that she was on once a month shot since November. Her mother and all of her mother siblings have pernicious anemia and your total of 10 children.  This patient has never been married has no children does not smoke drinks a glass of wine very rarely. She has no fevers chills or night sweats. Weight is stable. Appetite is good. She works Office manager. She is not aware of adenopathy no sore tongue no sore mouth no trouble swallowing presently bowels are normal no blood in her stools presently no blood in her urine no burning on urination etc. Physical exam reveals vital signs which are recorded she is 5 feet 3-1/2 inches tall weighs 118 pounds. She states many years ago she weight 160 pounds. She is in no acute distress though she does look pale. Nails are unremarkable. HEENT exam is unremarkable. Teeth are in good repair. Throat is clear. Tongue is normal and in the midline. She has no lymphadenopathy. Lungs are clear. Heart shows a regular rhythm and rate without murmur rub or gallop. Abdomen is soft and nontender without organomegaly other than she has fullness in the left upper quadrant in the right lateral decubitus position. She  has no peripheral edema pulses in her feet are 2+ and symmetrical she is right handed  I think she needs a bone marrow aspirate and biopsy with cytogenetics possibly flow cytometry and an abdominal ultrasound to rule out splenomegaly. I suspect she has a myelodysplastic syndrome either presently or is evolving into that at some point and will do so in the future. We will do some blood tests on her today but we'll plan on a bone marrow aspirate and biopsy in the near future and she is very agreeable to this plan. Her mother accompanied her today and is also comfortable with this plan. We spent 45 minutes talking to her examining her and in counseling her.

## 2011-10-17 NOTE — Progress Notes (Signed)
Danielle Byrd presented for labwork. Labs per MD order drawn via Peripheral Line 23 gauge needle inserted in left AC  Good blood return present. Procedure without incident.  Needle removed intact. Patient tolerated procedure well.   

## 2011-10-24 ENCOUNTER — Ambulatory Visit (HOSPITAL_COMMUNITY)
Admission: RE | Admit: 2011-10-24 | Discharge: 2011-10-24 | Disposition: A | Discharge status: Home / Self Care | Payer: 59 | Source: Ambulatory Visit | Attending: Oncology | Admitting: Oncology

## 2011-10-24 ENCOUNTER — Encounter (HOSPITAL_BASED_OUTPATIENT_CLINIC_OR_DEPARTMENT_OTHER): Payer: 59 | Admitting: Oncology

## 2011-10-24 VITALS — BP 102/54 | HR 76 | Temp 97.7°F

## 2011-10-24 DIAGNOSIS — D72819 Decreased white blood cell count, unspecified: Secondary | ICD-10-CM

## 2011-10-24 DIAGNOSIS — D539 Nutritional anemia, unspecified: Secondary | ICD-10-CM | POA: Insufficient documentation

## 2011-10-24 LAB — DIFFERENTIAL
Lymphocytes Relative: 50 % — ABNORMAL HIGH (ref 12–46)
Lymphs Abs: 1.4 10*3/uL (ref 0.7–4.0)
Monocytes Absolute: 0.2 10*3/uL (ref 0.1–1.0)
Monocytes Relative: 8 % (ref 3–12)
Neutro Abs: 1 10*3/uL — ABNORMAL LOW (ref 1.7–7.7)

## 2011-10-24 LAB — CBC
HCT: 32.4 % — ABNORMAL LOW (ref 36.0–46.0)
Hemoglobin: 11.5 g/dL — ABNORMAL LOW (ref 12.0–15.0)
RBC: 3.37 MIL/uL — ABNORMAL LOW (ref 3.87–5.11)
WBC: 2.8 10*3/uL — ABNORMAL LOW (ref 4.0–10.5)

## 2011-10-24 NOTE — Progress Notes (Signed)
Procedure note:  This lady has macrocytosis with anemia and leukopenia and is here for bone marrow aspirate and biopsy. After informed consent she was placed in the prone position and both posterior superior iliac spinous processes were identified in the left was chosen. The skin was cleansed with 3 Betadine swabs and she was anesthetized with 9 cc of 2% plain Xylocaine followed by a bone marrow aspirate for routine evaluation and possible flow cytometry. She then had a bone marrow biopsy without incident. Bone marrow was also sent for cytogenetics. The procedure was ended without complication.

## 2011-10-24 NOTE — Patient Instructions (Addendum)
Danielle Byrd  [id number] [birth year] Dr. Glenford Peers   [facility] Specialty Clinic  Discharge Instructions  RECOMMENDATIONS MADE BY THE CONSULTANT AND ANY TEST RESULTS WILL BE SENT TO YOUR REFERRING DOCTOR.   EXAM FINDINGS BY MD TODAY AND SIGNS AND SYMPTOMS TO REPORT TO CLINIC OR PRIMARY MD: You had a bone marrow biopsy and aspiration performed on your left hip.  Keep the dressing dry and intact for next 24 hours.  If bleeding occurs apply direct pressure to site.  If unable to stop the bleeding after 10 minutes of direct pressure go to the ED.  For next 24 hour do not do any strenuous activity so you can rest the area.  We will be in contact as we get results.  MEDICATIONS PRESCRIBED: Take your Hydrocodone if needed for discomfort.   INSTRUCTIONS GIVEN AND DISCUSSED: Other :  Report any unusual bleeding.  SPECIAL INSTRUCTIONS/FOLLOW-UP: Return to Clinic on 8/26 to see MD.   I acknowledge that I have been informed and understand all the instructions given to me and received a copy. I do not have any more questions at this time, but understand that I may call the Specialty Clinic at [facility] at [phone] during business hours should I have any further questions or need assistance in obtaining follow-up care.    __________________________________________  _____________  __________ Signature of Patient or Authorized Representative            Date                   Time    __________________________________________ Nurse's Signature

## 2011-10-24 NOTE — Progress Notes (Signed)
  [facility] BONE MARROW BIOPSY/ASPIRATE PROGRESS NOTE  LAPORSHA GREALISH presents for Bone Marrow biopsy per MD orders. Stacie Glaze verbalized understanding of procedure. Consent reviewed and signed.  Stacie Glaze positioned supine for procedure. Time-out performed and Bone Marrow Checklist. Procedure began at 0909. Xylocaine 2% 10 cc used for local and administered to patient by Dr. Mariel Sleet. Procedure completed at [phone]. Patient tolerated well. Pressure dressing applied to the left hip with instructions to leave in place for 24 hours. Patient instructed to report any bleeding that saturates dressing and to take pain medication Hydrocodone as directed. Dressing dry and intact to the left hip on discharge.        Stacie Glaze presented for labwork. Labs per MD order drawn via Peripheral Line 23 gauge needle inserted in left AC  Good blood return present. Procedure without incident.  Needle removed intact. Patient tolerated procedure well.

## 2011-10-25 ENCOUNTER — Encounter (INDEPENDENT_AMBULATORY_CARE_PROVIDER_SITE_OTHER): Payer: Self-pay

## 2011-10-31 ENCOUNTER — Other Ambulatory Visit (HOSPITAL_COMMUNITY): Payer: Self-pay | Admitting: Oncology

## 2011-10-31 ENCOUNTER — Telehealth (HOSPITAL_COMMUNITY): Payer: Self-pay

## 2011-10-31 DIAGNOSIS — D649 Anemia, unspecified: Secondary | ICD-10-CM

## 2011-10-31 DIAGNOSIS — E611 Iron deficiency: Secondary | ICD-10-CM

## 2011-10-31 NOTE — Telephone Encounter (Signed)
Call to [facility] and she is in agreement for iron infusion.  She is scheduled for Friday 8/2 @ 2:45pm.  Orders will need to be placed.

## 2011-11-02 ENCOUNTER — Encounter (HOSPITAL_COMMUNITY): Payer: 59 | Attending: Oncology

## 2011-11-02 VITALS — BP 114/71 | HR 72 | Temp 98.1°F | Resp 18

## 2011-11-02 DIAGNOSIS — D649 Anemia, unspecified: Secondary | ICD-10-CM | POA: Insufficient documentation

## 2011-11-02 DIAGNOSIS — D72819 Decreased white blood cell count, unspecified: Secondary | ICD-10-CM | POA: Insufficient documentation

## 2011-11-02 DIAGNOSIS — E611 Iron deficiency: Secondary | ICD-10-CM

## 2011-11-02 DIAGNOSIS — D509 Iron deficiency anemia, unspecified: Secondary | ICD-10-CM | POA: Insufficient documentation

## 2011-11-02 MED ORDER — SODIUM CHLORIDE 0.9 % IJ SOLN
INTRAMUSCULAR | Status: AC
  Filled 2011-11-02: qty 10

## 2011-11-02 MED ORDER — SODIUM CHLORIDE 0.9 % IV SOLN
1020.0000 mg | Freq: Once | INTRAVENOUS | Status: AC
  Administered 2011-11-02: 1020 mg via INTRAVENOUS
  Filled 2011-11-02: qty 34

## 2011-11-02 MED ORDER — SODIUM CHLORIDE 0.9 % IV SOLN
Freq: Once | INTRAVENOUS | Status: AC
  Administered 2011-11-02: 15:00:00 via INTRAVENOUS

## 2011-11-02 MED ORDER — SODIUM CHLORIDE 0.9 % IJ SOLN
10.0000 mL | INTRAMUSCULAR | Status: DC | PRN
  Administered 2011-11-02: 10 mL
  Filled 2011-11-02: qty 10

## 2011-11-02 NOTE — Progress Notes (Signed)
Tolerated well

## 2011-11-05 MED ORDER — FAMOTIDINE IN NACL 20-0.9 MG/50ML-% IV SOLN
INTRAVENOUS | Status: AC
  Filled 2011-11-05: qty 50

## 2011-11-05 MED ORDER — DIPHENHYDRAMINE HCL 50 MG/ML IJ SOLN
INTRAMUSCULAR | Status: AC
  Filled 2011-11-05: qty 1

## 2011-11-26 ENCOUNTER — Encounter (HOSPITAL_COMMUNITY): Payer: Self-pay | Admitting: Oncology

## 2011-11-26 ENCOUNTER — Encounter (HOSPITAL_BASED_OUTPATIENT_CLINIC_OR_DEPARTMENT_OTHER): Payer: 59 | Admitting: Oncology

## 2011-11-26 VITALS — BP 137/82 | HR 74 | Temp 98.1°F | Resp 16 | Wt 118.2 lb

## 2011-11-26 DIAGNOSIS — D539 Nutritional anemia, unspecified: Secondary | ICD-10-CM

## 2011-11-26 DIAGNOSIS — D649 Anemia, unspecified: Secondary | ICD-10-CM

## 2011-11-26 DIAGNOSIS — D72819 Decreased white blood cell count, unspecified: Secondary | ICD-10-CM

## 2011-11-26 LAB — RHEUMATOID FACTOR: Rhuematoid fact SerPl-aCnc: <10 IU/mL (ref <=14)

## 2011-11-26 LAB — CBC WITH DIFFERENTIAL/PLATELET
Lymphocytes Relative: 53 % — ABNORMAL HIGH (ref 12–46)
Lymphs Abs: 1.6 10*3/uL (ref 0.7–4.0)
MCV: 97.8 fL (ref 78.0–100.0)
Neutro Abs: 1.1 10*3/uL — ABNORMAL LOW (ref 1.7–7.7)
Neutrophils Relative %: 36 % — ABNORMAL LOW (ref 43–77)
Platelets: 186 10*3/uL (ref 150–400)
RBC: 3.25 MIL/uL — ABNORMAL LOW (ref 3.87–5.11)
WBC: 3.1 10*3/uL — ABNORMAL LOW (ref 4.0–10.5)

## 2011-11-26 NOTE — Progress Notes (Signed)
Danielle Byrd presented for labwork. Labs per MD order drawn via Peripheral Line 23 gauge needle inserted in left AC  Good blood return present. Procedure without incident.  Needle removed intact. Patient tolerated procedure well.

## 2011-11-26 NOTE — Patient Instructions (Addendum)
Danielle Byrd  DOB 07-03-1955 CSN [id number]  MRN [id number] Dr. Glenford Peers   [facility]  Discharge Instructions  RECOMMENDATIONS MADE BY THE CONSULTANT AND ANY TEST RESULTS WILL BE SENT TO YOUR REFERRING DOCTOR.   EXAM FINDINGS BY MD TODAY AND SIGNS AND SYMPTOMS TO REPORT TO CLINIC OR PRIMARY MD: You look better.  Once we will get the rest of the results of your bone marrow, we will call you.  MEDICATIONS PRESCRIBED: none   INSTRUCTIONS GIVEN AND DISCUSSED: Other :  Report increased fatigue, shortness of breath or increased ice intake.  SPECIAL INSTRUCTIONS/FOLLOW-UP: Lab work Needed today and in December and Return to Clinic after labs in December to be seen in follow-up.   I acknowledge that I have been informed and understand all the instructions given to me and received a copy. I do not have any more questions at this time, but understand that I may call the Specialty Clinic at [facility] at [phone] during business hours should I have any further questions or need assistance in obtaining follow-up care.    __________________________________________  _____________  __________ Signature of Patient or Authorized Representative            Date                   Time    __________________________________________ Nurse's Signature

## 2011-11-26 NOTE — Progress Notes (Signed)
Problem #1 macrocytic anemia leukopenia without a definite etiology. Her bone marrow aspirate and biopsy did reveal absence of iron stores and we did replace her with IV feraheme 1020 mg. She felt better afterwards with less sluggishness but not a return to normal activity and sense of well-being. Her cytogenetics were normal, her flow cytometry was normal, and there was no evidence for dyspoiesis of the marrow.  I will check some other tests including sedimentation rate ANA rheumatoid factor. She recently had her TSH checked right her primary care physician that was lower than the normal range indicating that she is on more than thyroid medication. I do want to see her back in December with a repeat blood count and a ferritin level at that time. I think I answered all of her questions and her mother's questions adequately.

## 2011-11-27 LAB — ANA: Anti Nuclear Antibody(ANA): NEGATIVE

## 2011-11-28 LAB — PROTEIN ELECTROPHORESIS, SERUM
Albumin ELP: 68 % — ABNORMAL HIGH (ref 55.8–66.1)
Alpha-1-Globulin: 3.7 % (ref 2.9–4.9)
Beta 2: 3.4 % (ref 3.2–6.5)

## 2012-02-18 ENCOUNTER — Encounter (HOSPITAL_COMMUNITY): Payer: 59 | Attending: Oncology

## 2012-02-18 DIAGNOSIS — D539 Nutritional anemia, unspecified: Secondary | ICD-10-CM | POA: Insufficient documentation

## 2012-02-18 DIAGNOSIS — D649 Anemia, unspecified: Secondary | ICD-10-CM

## 2012-02-18 DIAGNOSIS — D72819 Decreased white blood cell count, unspecified: Secondary | ICD-10-CM | POA: Insufficient documentation

## 2012-02-18 LAB — CBC WITH DIFFERENTIAL/PLATELET
Eosinophils Relative: 9 % — ABNORMAL HIGH (ref 0–5)
HCT: 33.4 % — ABNORMAL LOW (ref 36.0–46.0)
Hemoglobin: 11.6 g/dL — ABNORMAL LOW (ref 12.0–15.0)
Lymphocytes Relative: 57 % — ABNORMAL HIGH (ref 12–46)
Lymphs Abs: 1.6 10*3/uL (ref 0.7–4.0)
MCV: 99.7 fL (ref 78.0–100.0)
Monocytes Relative: 9 % (ref 3–12)
Neutro Abs: 0.7 10*3/uL — ABNORMAL LOW (ref 1.7–7.7)
RBC: 3.35 MIL/uL — ABNORMAL LOW (ref 3.87–5.11)
WBC: 2.7 10*3/uL — ABNORMAL LOW (ref 4.0–10.5)

## 2012-02-18 NOTE — Progress Notes (Signed)
Labs drawn today for cbc/diff,ferr 

## 2012-02-20 ENCOUNTER — Encounter (HOSPITAL_COMMUNITY): Payer: Self-pay | Admitting: Oncology

## 2012-02-20 ENCOUNTER — Encounter (HOSPITAL_BASED_OUTPATIENT_CLINIC_OR_DEPARTMENT_OTHER): Payer: 59 | Admitting: Oncology

## 2012-02-20 VITALS — BP 119/58 | HR 75 | Temp 97.8°F | Resp 16 | Wt 120.8 lb

## 2012-02-20 DIAGNOSIS — E611 Iron deficiency: Secondary | ICD-10-CM

## 2012-02-20 DIAGNOSIS — D509 Iron deficiency anemia, unspecified: Secondary | ICD-10-CM

## 2012-02-20 DIAGNOSIS — D649 Anemia, unspecified: Secondary | ICD-10-CM

## 2012-02-20 DIAGNOSIS — D72819 Decreased white blood cell count, unspecified: Secondary | ICD-10-CM

## 2012-02-20 NOTE — Patient Instructions (Addendum)
[facility] Specialty Clinic  Discharge Instructions  RECOMMENDATIONS MADE BY THE CONSULTANT AND ANY TEST RESULTS WILL BE SENT TO YOUR REFERRING DOCTOR.   EXAM FINDINGS BY MD TODAY AND SIGNS AND SYMPTOMS TO REPORT TO CLINIC OR PRIMARY MD: Discussion by MD.  If you want we can make a referral to one of the Universities to see a Hematologist for a second opinion.  Think about it and give [place] a call Tobie Lords, RN  [phone]) if you want a referral.  MEDICATIONS PRESCRIBED: none   INSTRUCTIONS GIVEN AND DISCUSSED: Other :  Report recurring infections, night sweats, increased ice or starch intake.  SPECIAL INSTRUCTIONS/FOLLOW-UP: Lab work Needed in February and Return to Clinic after labs in February to see MD.   I acknowledge that I have been informed and understand all the instructions given to me and received a copy. I do not have any more questions at this time, but understand that I may call the Specialty Clinic at [facility] at [phone] during business hours should I have any further questions or need assistance in obtaining follow-up care.    __________________________________________  _____________  __________ Signature of Patient or Authorized Representative            Date                   Time    __________________________________________ Nurse's Signature

## 2012-02-20 NOTE — Progress Notes (Signed)
Problem #1 macrocytic anemia, leukopenia, with a negative workup including bone marrow aspiration and biopsy. It revealed normal-appearing cells, and no evidence for myelodysplasia, no tumor, no leukemia, but only decreased iron stores. We replenish her with IV iron which made her feel temporarily better for a few weeks only. Her ANA, rheumatoid factor, sedimentation rate, TSH, or were all unremarkable in the past few weeks as well as few months. Her ferritin level today is well within the normal range. Her CBC and differential are stable. She however doesn't feel well. Hemoglobin remains about the same white count remains about the same both slightly low where as her platelets are normal. I do not think there is much more to do presently but I have offered her a second opinion at one of the Universites if she so desires. I doubt that she has an underlying malignancy with both leukopenia and anemia.  Therefore I have asked her to consider a second opinion if she so desires. Otherwise I would follow her along and repeat her bone marrow biopsy if other things change.

## 2012-03-03 ENCOUNTER — Other Ambulatory Visit (HOSPITAL_COMMUNITY): Payer: 59

## 2012-03-05 ENCOUNTER — Ambulatory Visit (HOSPITAL_COMMUNITY): Payer: 59 | Admitting: Oncology

## 2012-03-07 ENCOUNTER — Telehealth (HOSPITAL_COMMUNITY): Payer: Self-pay

## 2012-03-07 NOTE — Telephone Encounter (Signed)
Call from Ms. Danielle Byrd and requests that Dr. Mariel Sleet recommend someone that specializes in Fibromyalgia that she could see.  States "My fibromyalgia pain is getting worse and the pain medication I'm on is too hard on my stomach and I would like for Dr. Mariel Sleet to recommend someone that I can see."

## 2012-03-07 NOTE — Telephone Encounter (Signed)
Patient has been seeing a Dr. Cordelia Pen and felt MD gave her too much medication.  Patient is in agreement to see Dr. Zenovia Jordan.

## 2012-03-07 NOTE — Telephone Encounter (Signed)
Message copied by Evelena Leyden on Fri Mar 07, 2012  4:30 PM ------      Message from: Mariel Sleet, ERIC S      Created: Fri Mar 07, 2012 10:57 AM       Ask her who has managed her fibromyalgia before      If not angela hawkes, obtain consult

## 2012-03-10 ENCOUNTER — Telehealth (HOSPITAL_COMMUNITY): Payer: Self-pay | Admitting: *Deleted

## 2012-03-10 NOTE — Telephone Encounter (Signed)
[name] from Dr. Nickola Major office called me back on the referral I sent for Danielle Byrd and said that Dr. Nickola Major does not treat Fibromyalgia .

## 2012-05-14 ENCOUNTER — Encounter (INDEPENDENT_AMBULATORY_CARE_PROVIDER_SITE_OTHER): Payer: Self-pay | Admitting: *Deleted

## 2012-05-20 ENCOUNTER — Encounter (HOSPITAL_COMMUNITY): Payer: 59 | Attending: Oncology

## 2012-05-20 DIAGNOSIS — E611 Iron deficiency: Secondary | ICD-10-CM

## 2012-05-20 DIAGNOSIS — D72819 Decreased white blood cell count, unspecified: Secondary | ICD-10-CM

## 2012-05-20 DIAGNOSIS — D649 Anemia, unspecified: Secondary | ICD-10-CM

## 2012-05-20 DIAGNOSIS — D539 Nutritional anemia, unspecified: Secondary | ICD-10-CM | POA: Insufficient documentation

## 2012-05-20 LAB — CBC WITH DIFFERENTIAL/PLATELET
Basophils Absolute: 0 10*3/uL (ref 0.0–0.1)
Eosinophils Relative: 1 % (ref 0–5)
Lymphocytes Relative: 49 % — ABNORMAL HIGH (ref 12–46)
Lymphs Abs: 1.6 10*3/uL (ref 0.7–4.0)
MCV: 98.6 fL (ref 78.0–100.0)
Neutro Abs: 1.3 10*3/uL — ABNORMAL LOW (ref 1.7–7.7)
Neutrophils Relative %: 39 % — ABNORMAL LOW (ref 43–77)
Platelets: 179 10*3/uL (ref 150–400)
RBC: 3.46 MIL/uL — ABNORMAL LOW (ref 3.87–5.11)
WBC: 3.2 10*3/uL — ABNORMAL LOW (ref 4.0–10.5)

## 2012-05-20 LAB — IRON AND TIBC
Iron: 137 ug/dL — ABNORMAL HIGH (ref 42–135)
Saturation Ratios: 41 % (ref 20–55)
TIBC: 332 ug/dL (ref 250–470)
UIBC: 195 ug/dL (ref 125–400)

## 2012-05-20 NOTE — Progress Notes (Signed)
Labs drawn today for cbc/diff,copper,Iron and IBC,Ferr

## 2012-05-21 LAB — COPPER, SERUM: Copper: 65 ug/dL — ABNORMAL LOW (ref 70–175)

## 2012-05-26 ENCOUNTER — Encounter (HOSPITAL_BASED_OUTPATIENT_CLINIC_OR_DEPARTMENT_OTHER): Payer: 59 | Admitting: Oncology

## 2012-05-26 ENCOUNTER — Encounter (HOSPITAL_COMMUNITY): Payer: Self-pay | Admitting: Oncology

## 2012-05-26 VITALS — BP 112/60 | HR 67 | Temp 98.0°F | Resp 16 | Wt 121.8 lb

## 2012-05-26 DIAGNOSIS — D72819 Decreased white blood cell count, unspecified: Secondary | ICD-10-CM

## 2012-05-26 DIAGNOSIS — D649 Anemia, unspecified: Secondary | ICD-10-CM

## 2012-05-26 DIAGNOSIS — D539 Nutritional anemia, unspecified: Secondary | ICD-10-CM

## 2012-05-26 NOTE — Progress Notes (Signed)
#  1 macrocytic anemia, leukopenia, with a negative bone marrow aspiration and biopsy which revealed no evidence for her myelodysplastic syndrome, no leukemia, no tumor cells, only decreased iron stores. She has had her iron to replenish with IV iron, she felt temporarily better for a few weeks only. Her ANA, rheumatoid factor, sedimentation rate, TSH, have all been unremarkable. Recently we checked her serum copper level which is mildly low. We will replace that with over-the-counter copper 2 mg twice a day and check her levels in 3 months along with her CBC and differential. She continues to feel sluggish and aching in her muscles at times but feels more clearheaded since stopping the Cymbalta. Her hemoglobin is up to 12 g, white count 3200 with slightly more neutrophils at 1300. Iron studies are well within the normal range.  Her platelets are normal as well.  She will continue to keep her rheumatology appointment with Dr. Corliss Skains. We will see her in 3 months with repeat copper level and CBC and differential  I think she does appear mildly depressed at time and could stand to use more activity in her life.

## 2012-05-26 NOTE — Patient Instructions (Addendum)
[facility] Discharge Instructions  RECOMMENDATIONS MADE BY THE CONSULTANT AND ANY TEST RESULTS WILL BE SENT TO YOUR REFERRING PHYSICIAN.  EXAM FINDINGS BY THE PHYSICIAN TODAY AND SIGNS OR SYMPTOMS TO REPORT TO CLINIC OR PRIMARY PHYSICIAN:  Discussion by MD.  Bonita Quin have a copper deficiency and we will get you on some oral copper.  Want you get some exercise to help improve your energy and mental status.  MEDICATIONS PRESCRIBED:  Copper 2mg  take 1 twice daily (Over- the- counter)  INSTRUCTIONS GIVEN AND DISCUSSED: Will check a copper level in 3 months to see how you are doing.  SPECIAL INSTRUCTIONS/FOLLOW-UP: Follow-up in 3 months after bloodwork.  Thank you for choosing [facility] to provide your oncology and hematology care.  To afford each patient quality time with our providers, please arrive at least 15 minutes before your scheduled appointment time.  With your help, our goal is to use those 15 minutes to complete the necessary work-up to ensure our physicians have the information they need to help with your evaluation and healthcare recommendations.    Effective January 1st, 2014, we ask that you re-schedule your appointment with our physicians should you arrive 10 or more minutes late for your appointment.  We strive to give you quality time with our providers, and arriving late affects you and other patients whose appointments are after yours.    Again, thank you for choosing [facility].  Our hope is that these requests will decrease the amount of time that you wait before being seen by our physicians.       _____________________________________________________________  Should you have questions after your visit to [facility], please contact our office at [phone] between the hours of 8:30 a.m. and 5:00 p.m.  Voicemails left after 4:30 p.m. will not be returned until the following business day.  For prescription refill  requests, have your pharmacy contact our office with your prescription refill request.

## 2012-05-28 ENCOUNTER — Ambulatory Visit (INDEPENDENT_AMBULATORY_CARE_PROVIDER_SITE_OTHER): Payer: 59 | Admitting: Internal Medicine

## 2012-06-19 ENCOUNTER — Ambulatory Visit (INDEPENDENT_AMBULATORY_CARE_PROVIDER_SITE_OTHER): Payer: 59 | Admitting: Internal Medicine

## 2012-06-19 ENCOUNTER — Telehealth (INDEPENDENT_AMBULATORY_CARE_PROVIDER_SITE_OTHER): Payer: Self-pay | Admitting: Internal Medicine

## 2012-06-19 ENCOUNTER — Encounter (INDEPENDENT_AMBULATORY_CARE_PROVIDER_SITE_OTHER): Payer: Self-pay | Admitting: Internal Medicine

## 2012-06-19 VITALS — BP 104/70 | HR 72 | Temp 98.2°F | Ht 65.5 in | Wt 118.7 lb

## 2012-06-19 DIAGNOSIS — K5900 Constipation, unspecified: Secondary | ICD-10-CM

## 2012-06-19 DIAGNOSIS — Z800 Family history of malignant neoplasm of digestive organs: Secondary | ICD-10-CM

## 2012-06-19 DIAGNOSIS — K219 Gastro-esophageal reflux disease without esophagitis: Secondary | ICD-10-CM

## 2012-06-19 NOTE — Telephone Encounter (Signed)
Message left that she is due for a colonoscopy

## 2012-06-19 NOTE — Progress Notes (Signed)
Subjective:     Patient ID: Danielle Byrd, female   DOB: November 06, 1955, 57 y.o.   MRN: [id number]  HPI Here today for f/u. She tells me she is off the Cymbalta due to her insurance. Off the Cymbalta since end of November. Her BMs are better. She still has periods of constipation but is much better. Appetite has been good. No melena or bright red rectal bleeding. If she gets constipated she see at times blood if she strains. She takes Correctol 3 tabs if she need it. She is taking the generic for Aciphex and is controlled. Co pay for her Aciphex generic is 85$. She has rt upper arm pain which she thinks is her fibromyalgia. She is due for a colonoscopy now.  06/05/2012 WBC 3.5, H and H 35.3 and 12.0, Platelet ct 211, Vitamin B 12 293, K 4.3, Chloride 106, BUN 13, Creatinine 0.74, Glucose 89. Calcium 9.5, AST 21, ALT 10, ALP 74, Total bili 0.5, Total protein 6.6, Albumin 4.3, TSH 1.323, Thyroxine Free 1.12.     10/11/06 EGD/Colonoscopy Mild changes of reflux esophagitis limited to GE junction. Fundal wrap appears to be loose. Normal colonoscopy except for external hemorrhoids.  Review of Systems see hpi Current Outpatient Prescriptions  Medication Sig Dispense Refill  . ALPRAZolam (XANAX) 0.25 MG tablet Take 0.25 mg by mouth 3 (three) times daily.       . Biotin 7500 MCG TABS Take 7,500 mcg by mouth daily.       . Cholecalciferol (VITAMIN D3) 3000 UNITS TABS Take 2,000 Units by mouth daily.      . Flaxseed, Linseed, 1000 MG CAPS Take 1,200 mg by mouth.       [name] Oil 500 MG CAPS Take 1,000 mg by mouth daily.       Marland Kitchen levothyroxine (SYNTHROID, LEVOTHROID) 50 MCG tablet Take 50 mcg by mouth daily.      . meloxicam (MOBIC) 15 MG tablet Take 15 mg by mouth daily.      . RABEprazole (ACIPHEX) 20 MG tablet Take 1 tablet (20 mg total) by mouth daily.  30 tablet  6  . tobramycin-dexamethasone (TOBRADEX) ophthalmic solution Place 1 drop into both eyes 3 (three) times daily.      . traZODone (DESYREL) 50  MG tablet Take 50 mg by mouth at bedtime.       . vitamin C (ASCORBIC ACID) 500 MG tablet Take 500 mg by mouth daily.        . Linaclotide (LINZESS) 290 MCG CAPS Take 1 capsule by mouth as needed.       . Menthol 3.1 % GEL Apply topically. Patient uses a roll on Perform pain relieving, form of this Menthol      . oxyCODONE-acetaminophen (PERCOCET) 10-325 MG per tablet Take 1 tablet by mouth every 6 (six) hours as needed.         No current facility-administered medications for this visit.   Past Medical History  Diagnosis Date  . High cholesterol   . Kidney stones   . Hypothyroidism   . Cervical stenosis (uterine cervix)   . Fibromyalgia   . GERD (gastroesophageal reflux disease)   . Anxiety   . Constipation    Past Surgical History  Procedure Laterality Date  . Cholecystectomy    . Abdominal hysterectomy    . Eye surgery    . Dilation and curettage of uterus     Allergies  Allergen Reactions  . Vicodin (Hydrocodone-Acetaminophen) Diarrhea  Nausea and vomiting  . Codeine Itching  . Statins         Objective:   Physical Exam  Filed Vitals:   06/19/12 0925  BP: 104/70  Pulse: 72  Temp: 98.2 F (36.8 C)  Height: 5' 5.5" (1.664 m)  Weight: 118 lb 11.2 oz (53.842 kg)   Alert and oriented. Skin warm and dry. Oral mucosa is moist.   . Sclera anicteric, conjunctivae is pink. Thyroid not enlarged. No cervical lymphadenopathy. Lungs clear. Heart regular rate and rhythm.  Abdomen is soft. Bowel sounds are positive. No hepatomegaly. No abdominal masses felt. No tenderness.  No edema to lower extremities.       Assessment:    GERD controlled at this time.  Constipation which is better since being of Cymbalta. Family hx of colon cancer (father and paternal uncle).  I discussed with Dr. Karilyn Cota.     Plan:    OV in 1 yr. Surveillance colonoscopy with Dr. Karilyn Cota.

## 2012-06-19 NOTE — Telephone Encounter (Signed)
lmom asking patient to call me so I can sch'd TCS (fam hx colon ca, last TCS 2008, paper chart)

## 2012-06-19 NOTE — Patient Instructions (Addendum)
OV in 87yr. Surveillance colonoscopy. Samples of Zegrid # 5 boxes given to patient.

## 2012-06-20 ENCOUNTER — Other Ambulatory Visit (INDEPENDENT_AMBULATORY_CARE_PROVIDER_SITE_OTHER): Payer: Self-pay | Admitting: *Deleted

## 2012-06-20 ENCOUNTER — Telehealth (INDEPENDENT_AMBULATORY_CARE_PROVIDER_SITE_OTHER): Payer: Self-pay | Admitting: *Deleted

## 2012-06-20 DIAGNOSIS — Z1211 Encounter for screening for malignant neoplasm of colon: Secondary | ICD-10-CM

## 2012-06-20 NOTE — Telephone Encounter (Signed)
Patient needs movi prep 

## 2012-06-20 NOTE — Telephone Encounter (Signed)
lmom asking patient to call so I can schedule her TCS

## 2012-06-23 MED ORDER — PEG-KCL-NACL-NASULF-NA ASC-C 100 G PO SOLR: 1.0000 | Freq: Once | ORAL | Status: DC

## 2012-07-08 ENCOUNTER — Encounter (HOSPITAL_COMMUNITY): Payer: Self-pay | Admitting: Pharmacy Technician

## 2012-07-22 ENCOUNTER — Ambulatory Visit (HOSPITAL_COMMUNITY)
Admission: RE | Admit: 2012-07-22 | Discharge: 2012-07-22 | Disposition: A | Discharge status: Home / Self Care | Payer: 59 | Source: Ambulatory Visit | Attending: Internal Medicine | Admitting: Internal Medicine

## 2012-07-22 ENCOUNTER — Encounter (HOSPITAL_COMMUNITY)
Admission: RE | Disposition: A | Discharge status: Home / Self Care | Payer: Self-pay | Source: Ambulatory Visit | Attending: Internal Medicine

## 2012-07-22 ENCOUNTER — Encounter (HOSPITAL_COMMUNITY): Payer: Self-pay | Admitting: *Deleted

## 2012-07-22 DIAGNOSIS — K5900 Constipation, unspecified: Secondary | ICD-10-CM | POA: Insufficient documentation

## 2012-07-22 DIAGNOSIS — M129 Arthropathy, unspecified: Secondary | ICD-10-CM | POA: Insufficient documentation

## 2012-07-22 DIAGNOSIS — K644 Residual hemorrhoidal skin tags: Secondary | ICD-10-CM

## 2012-07-22 DIAGNOSIS — Z79899 Other long term (current) drug therapy: Secondary | ICD-10-CM | POA: Insufficient documentation

## 2012-07-22 DIAGNOSIS — Z1211 Encounter for screening for malignant neoplasm of colon: Secondary | ICD-10-CM | POA: Insufficient documentation

## 2012-07-22 DIAGNOSIS — Z860100 Personal history of colon polyps, unspecified: Secondary | ICD-10-CM | POA: Insufficient documentation

## 2012-07-22 DIAGNOSIS — E039 Hypothyroidism, unspecified: Secondary | ICD-10-CM | POA: Insufficient documentation

## 2012-07-22 DIAGNOSIS — Z888 Allergy status to other drugs, medicaments and biological substances status: Secondary | ICD-10-CM | POA: Insufficient documentation

## 2012-07-22 DIAGNOSIS — K6389 Other specified diseases of intestine: Secondary | ICD-10-CM

## 2012-07-22 DIAGNOSIS — Z791 Long term (current) use of non-steroidal anti-inflammatories (NSAID): Secondary | ICD-10-CM | POA: Insufficient documentation

## 2012-07-22 DIAGNOSIS — K648 Other hemorrhoids: Secondary | ICD-10-CM | POA: Insufficient documentation

## 2012-07-22 DIAGNOSIS — E7800 Pure hypercholesterolemia, unspecified: Secondary | ICD-10-CM | POA: Insufficient documentation

## 2012-07-22 DIAGNOSIS — K219 Gastro-esophageal reflux disease without esophagitis: Secondary | ICD-10-CM | POA: Insufficient documentation

## 2012-07-22 DIAGNOSIS — Z885 Allergy status to narcotic agent status: Secondary | ICD-10-CM | POA: Insufficient documentation

## 2012-07-22 DIAGNOSIS — D649 Anemia, unspecified: Secondary | ICD-10-CM | POA: Insufficient documentation

## 2012-07-22 DIAGNOSIS — D1779 Benign lipomatous neoplasm of other sites: Secondary | ICD-10-CM | POA: Insufficient documentation

## 2012-07-22 DIAGNOSIS — Z800 Family history of malignant neoplasm of digestive organs: Secondary | ICD-10-CM | POA: Insufficient documentation

## 2012-07-22 DIAGNOSIS — IMO0001 Reserved for inherently not codable concepts without codable children: Secondary | ICD-10-CM | POA: Insufficient documentation

## 2012-07-22 HISTORY — DX: Unspecified osteoarthritis, unspecified site: M19.90

## 2012-07-22 HISTORY — PX: COLONOSCOPY: SHX5424

## 2012-07-22 HISTORY — DX: Anemia, unspecified: D64.9

## 2012-07-22 HISTORY — DX: Copper deficiency: E61.0

## 2012-07-22 SURGERY — COLONOSCOPY
Anesthesia: Moderate Sedation

## 2012-07-22 MED ORDER — SODIUM CHLORIDE 0.9 % IV SOLN
INTRAVENOUS | Status: DC
  Administered 2012-07-22: 07:00:00 via INTRAVENOUS

## 2012-07-22 MED ORDER — MIDAZOLAM HCL 5 MG/5ML IJ SOLN
INTRAMUSCULAR | Status: DC | PRN
  Administered 2012-07-22: 2 mg via INTRAVENOUS
  Administered 2012-07-22: 1 mg via INTRAVENOUS
  Administered 2012-07-22 (×2): 2 mg via INTRAVENOUS

## 2012-07-22 MED ORDER — MEPERIDINE HCL 50 MG/ML IJ SOLN
INTRAMUSCULAR | Status: AC
  Filled 2012-07-22: qty 1

## 2012-07-22 MED ORDER — MIDAZOLAM HCL 5 MG/5ML IJ SOLN
INTRAMUSCULAR | Status: AC
  Filled 2012-07-22: qty 10

## 2012-07-22 MED ORDER — SIMETHICONE 40 MG/0.6ML PO SUSP
ORAL | Status: DC | PRN
  Administered 2012-07-22: 07:00:00

## 2012-07-22 MED ORDER — MEPERIDINE HCL 50 MG/ML IJ SOLN
INTRAMUSCULAR | Status: DC | PRN
  Administered 2012-07-22 (×2): 25 mg via INTRAVENOUS

## 2012-07-22 NOTE — H&P (Signed)
Danielle Byrd is an 57 y.o. female.   Chief Complaint: Patient is here for colonoscopy. HPI: Patient is 57 year old Caucasian female who has history of colonic polyps and family history of colon carcinoma and is here for surveillance colonoscopy. She denies abdominal pain or rectal bleeding. She has chronic constipation but lately has been doing better. She denies anorexia weight loss. Patient's last colonoscopy was [facility] 5 years ago. Family history is significant for colon carcinoma in a paternal grandmother and father but they were both in their 82s.  Past Medical History  Diagnosis Date  . High cholesterol   . Kidney stones   . Hypothyroidism   . Cervical stenosis (uterine cervix)   . Fibromyalgia   . GERD (gastroesophageal reflux disease)   . Anxiety   . Constipation   . Arthritis   . Anemia   . Copper deficiency     Past Surgical History  Procedure Laterality Date  . Cholecystectomy    . Abdominal hysterectomy    . Eye surgery    . Dilation and curettage of uterus    . Breast surgery Right     partial mastectomy  . Nissen fundoplication      Family History  Problem Relation Age of Onset  . Diabetes Mother   . Hypertension Mother   . Anemia Mother   . Colon cancer Father   . Cancer Father 40    colon cancer   Social History:  reports that she has never smoked. She has never used smokeless tobacco. She reports that  drinks alcohol. She reports that she does not use illicit drugs.  Allergies:  Allergies  Allergen Reactions  . Vicodin (Hydrocodone-Acetaminophen) Diarrhea    Nausea and vomiting  . Codeine Itching  . Statins     Medications Prior to Admission  Medication Sig Dispense Refill  . ALPRAZolam (XANAX) 0.25 MG tablet Take 0.25 mg by mouth 3 (three) times daily.       . Biotin 7500 MCG TABS Take 7,500 mcg by mouth daily.       . Cholecalciferol (VITAMIN D3) 3000 UNITS TABS Take 2,000 Units by mouth daily.      . Flaxseed, Linseed, 1000 MG CAPS Take  1,200 mg by mouth.       [name] Oil 500 MG CAPS Take 1,000 mg by mouth daily.       Marland Kitchen levothyroxine (SYNTHROID, LEVOTHROID) 50 MCG tablet Take 50 mcg by mouth daily.      . Linaclotide (LINZESS) 290 MCG CAPS Take 1 capsule by mouth as needed.       . meloxicam (MOBIC) 15 MG tablet Take 15 mg by mouth daily.      . Menthol 3.1 % GEL Apply topically. Patient uses a roll on Perform pain relieving, form of this Menthol      . peg 3350 powder (MOVIPREP) 100 G SOLR Take 1 kit (100 g total) by mouth once.  1 kit  0  . RABEprazole (ACIPHEX) 20 MG tablet Take 1 tablet (20 mg total) by mouth daily.  30 tablet  6  . tobramycin-dexamethasone (TOBRADEX) ophthalmic solution Place 1 drop into both eyes 3 (three) times daily.      . traZODone (DESYREL) 50 MG tablet Take 50 mg by mouth at bedtime.       . vitamin C (ASCORBIC ACID) 500 MG tablet Take 500 mg by mouth daily.        Marland Kitchen oxyCODONE-acetaminophen (PERCOCET) 10-325 MG per tablet Take 1  tablet by mouth every 6 (six) hours as needed.          No results found for this or any previous visit (from the past 48 hour(s)). No results found.  ROS  Blood pressure 127/82, pulse 69, temperature 98.2 F (36.8 C), temperature source Oral, resp. rate 20, height 5' 5.5" (1.664 m), weight 118 lb (53.524 kg), SpO2 97.00%. Physical Exam  Constitutional: She appears well-developed and well-nourished.  HENT:  Mouth/Throat: Oropharynx is clear and moist.  Eyes: Conjunctivae are normal. No scleral icterus.  Neck: No thyromegaly present.  Cardiovascular: Normal rate, regular rhythm and normal heart sounds.   No murmur heard. Respiratory: Effort normal and breath sounds normal.  GI: Soft. She exhibits no distension and no mass. There is no tenderness.  Musculoskeletal: She exhibits no edema.  Lymphadenopathy:    She has no cervical adenopathy.  Neurological: She is alert.  Skin: Skin is warm and dry.     Assessment/Plan History of colonic polyps. Family  history of CRC at late onset. Surveillance colonoscopy.  Iliani Vejar U 07/22/2012, 7:35 AM

## 2012-07-22 NOTE — Op Note (Signed)
COLONOSCOPY PROCEDURE REPORT  PATIENT:  Danielle Byrd  MR#:  [id number] Birthdate:  July 11, 1955, 57 y.o., female Endoscopist:  Dr. Malissa Hippo, MD Referred By:  Dr. Majel Homer, MD Procedure Date: 07/22/2012  Procedure:   Colonoscopy  Indications:  Patient is 57 year old Caucasian female with history of colonic polyps and family history of colon carcinoma(father and paternal grandmother at the late onset).  Informed Consent:  The procedure and risks were reviewed with the patient and informed consent was obtained.  Medications:  Demerol 50 mg IV Versed 7 mg IV  Description of procedure:  After a digital rectal exam was performed, that colonoscope was advanced from the anus through the rectum and colon to the area of the cecum, ileocecal valve and appendiceal orifice. The cecum was deeply intubated. These structures were well-seen and photographed for the record. From the level of the cecum and ileocecal valve, the scope was slowly and cautiously withdrawn. The mucosal surfaces were carefully surveyed utilizing scope tip to flexion to facilitate fold flattening as needed. The scope was pulled down into the rectum where a thorough exam including retroflexion was performed. Terminal ileum was also examined  Findings:   Prep excellent Normal mucosa of terminal ileum. About 10 mm submucosal lipoma at hepatic flexure. No polyps or diverticular changes noted. Normal rectal mucosa. Prominent hemorrhoids below the dentate line.    Therapeutic/Diagnostic Maneuvers Performed:  None  Complications:  None  Cecal Withdrawal Time:  11 minutes  Impression:  Normal terminal ileum. No evidence of recurrent polyps. Incidental finding of small lipoma at hepatic flexure. External hemorrhoids.  Recommendations:  Standard instructions given. Next colonoscopy in 5 years  REHMAN,NAJEEB U  07/22/2012 8:13 AM  CC: Dr. Majel Homer, MD & Dr. Bonnetta Barry ref. provider found

## 2012-07-24 ENCOUNTER — Other Ambulatory Visit (INDEPENDENT_AMBULATORY_CARE_PROVIDER_SITE_OTHER): Payer: Self-pay | Admitting: Internal Medicine

## 2012-07-25 ENCOUNTER — Encounter (HOSPITAL_COMMUNITY): Payer: Self-pay | Admitting: Internal Medicine

## 2012-08-18 ENCOUNTER — Encounter (INDEPENDENT_AMBULATORY_CARE_PROVIDER_SITE_OTHER): Payer: Self-pay

## 2012-08-21 ENCOUNTER — Encounter (HOSPITAL_COMMUNITY): Payer: 59 | Attending: Hematology and Oncology

## 2012-08-21 DIAGNOSIS — D649 Anemia, unspecified: Secondary | ICD-10-CM

## 2012-08-21 DIAGNOSIS — D509 Iron deficiency anemia, unspecified: Secondary | ICD-10-CM

## 2012-08-21 DIAGNOSIS — D539 Nutritional anemia, unspecified: Secondary | ICD-10-CM | POA: Insufficient documentation

## 2012-08-21 DIAGNOSIS — D72819 Decreased white blood cell count, unspecified: Secondary | ICD-10-CM | POA: Insufficient documentation

## 2012-08-21 LAB — CBC WITH DIFFERENTIAL/PLATELET
Basophils Absolute: 0 10*3/uL (ref 0.0–0.1)
Basophils Relative: 1 % (ref 0–1)
Hemoglobin: 11.3 g/dL — ABNORMAL LOW (ref 12.0–15.0)
MCHC: 34.8 g/dL (ref 30.0–36.0)
Neutro Abs: 1.4 10*3/uL — ABNORMAL LOW (ref 1.7–7.7)
Neutrophils Relative %: 42 % — ABNORMAL LOW (ref 43–77)
RDW: 12.2 % (ref 11.5–15.5)

## 2012-08-21 NOTE — Progress Notes (Signed)
Labs drawn today for cbc/diff,copper

## 2012-08-23 LAB — COPPER, SERUM: Copper: 71 ug/dL (ref 70–175)

## 2012-08-26 ENCOUNTER — Telehealth (HOSPITAL_COMMUNITY): Payer: Self-pay | Admitting: Dietician

## 2012-08-26 ENCOUNTER — Encounter (HOSPITAL_COMMUNITY): Payer: Self-pay | Admitting: Oncology

## 2012-08-26 ENCOUNTER — Encounter (HOSPITAL_BASED_OUTPATIENT_CLINIC_OR_DEPARTMENT_OTHER): Payer: 59 | Admitting: Oncology

## 2012-08-26 VITALS — BP 104/75 | HR 74 | Temp 98.5°F | Resp 18 | Wt 125.8 lb

## 2012-08-26 DIAGNOSIS — D539 Nutritional anemia, unspecified: Secondary | ICD-10-CM

## 2012-08-26 DIAGNOSIS — D649 Anemia, unspecified: Secondary | ICD-10-CM

## 2012-08-26 DIAGNOSIS — D72819 Decreased white blood cell count, unspecified: Secondary | ICD-10-CM

## 2012-08-26 NOTE — Telephone Encounter (Signed)
Nutrition Note  Pt identified for weight loss on [facility] Nutrition Screen.   Wt Readings from Last 10 Encounters:  08/26/12 125 lb 12.8 oz (57.063 kg)  07/22/12 118 lb (53.524 kg)  07/22/12 118 lb (53.524 kg)  06/19/12 118 lb 11.2 oz (53.842 kg)  05/26/12 121 lb 12.8 oz (55.248 kg)  02/20/12 120 lb 12.8 oz (54.795 kg)  11/26/11 118 lb 3.2 oz (53.615 kg)  10/17/11 118 lb (53.524 kg)  05/14/11 119 lb 1.6 oz (54.023 kg)   Chart reviewed. Pt followed at [facility] for anemia and leukocytopenia. Wt hx reveals UBW: 119#. Noted upward trend of wt gain of past year. Chart suggest distant hx of weight loss; MD notes reveal pt was 160# "years ago". Wt hx reveals 6# (4.2%) wt gain x 1 year, 5# (4.2%) wt gain x 6 months, 4# (3.3%) wt gain x 3 months, and 7# (5.9%) wt gain x 1 month. Current BMI of 20.49 WNL.  No nutrition concerns at this time. If further nutrition issues arise, please consult RD.   Melody Haver, RD, LDN Pager: [phone]

## 2012-08-26 NOTE — Progress Notes (Signed)
#  1 macrocytic anemia with leukopenia but negative bone marrow aspiration and biopsy which revealed no evidence for myelodysplastic syndrome etc. She did have decreased iron stores and we replaced her with IV iron. She temporarily felt better for a few weeks. She then fell back into sense of weakness and fatigue.  The only test that I have not done is [facility] screening test and we will do that today. Otherwise I think we need to continue to observe her in 6 months to see if she is evolving into some other disorder at some point in the future. She's been see Dr. Corliss Skains who is trying to help her get more comfortable and more energy

## 2012-08-26 NOTE — Patient Instructions (Addendum)
[facility] Discharge Instructions  RECOMMENDATIONS MADE BY THE CONSULTANT AND ANY TEST RESULTS WILL BE SENT TO YOUR REFERRING PHYSICIAN.  EXAM FINDINGS BY THE PHYSICIAN TODAY AND SIGNS OR SYMPTOMS TO REPORT TO CLINIC OR PRIMARY PHYSICIAN: Exam and discussion by MD.  We will do an additional blood test today.  MEDICATIONS PRESCRIBED:  none  INSTRUCTIONS GIVEN AND DISCUSSED: Report recurring infections, increased shortness of breath or other problems.  SPECIAL INSTRUCTIONS/FOLLOW-UP: Follow-up in 6 months with blood work and follow-up appointment.  Thank you for choosing [facility] to provide your oncology and hematology care.  To afford each patient quality time with our providers, please arrive at least 15 minutes before your scheduled appointment time.  With your help, our goal is to use those 15 minutes to complete the necessary work-up to ensure our physicians have the information they need to help with your evaluation and healthcare recommendations.    Effective January 1st, 2014, we ask that you re-schedule your appointment with our physicians should you arrive 10 or more minutes late for your appointment.  We strive to give you quality time with our providers, and arriving late affects you and other patients whose appointments are after yours.    Again, thank you for choosing [facility].  Our hope is that these requests will decrease the amount of time that you wait before being seen by our physicians.       _____________________________________________________________  Should you have questions after your visit to [facility], please contact our office at [phone] between the hours of 8:30 a.m. and 5:00 p.m.  Voicemails left after 4:30 p.m. will not be returned until the following business day.  For prescription refill requests, have your pharmacy contact our office with your prescription refill request.

## 2012-09-10 LAB — MISCELLANEOUS TEST

## 2012-10-10 ENCOUNTER — Telehealth (HOSPITAL_COMMUNITY): Payer: Self-pay | Admitting: Oncology

## 2012-10-10 NOTE — Telephone Encounter (Signed)
Danielle Byrd called the clinic to state she went to Dr. Fatima Sanger office for eval - was told she had Sjorgrens Syndrome.  Attempted to reach Dr. Fatima Sanger office to get office visit note and labs.

## 2013-01-29 ENCOUNTER — Other Ambulatory Visit (INDEPENDENT_AMBULATORY_CARE_PROVIDER_SITE_OTHER): Payer: Self-pay | Admitting: Internal Medicine

## 2013-02-16 ENCOUNTER — Other Ambulatory Visit (HOSPITAL_COMMUNITY): Payer: 59

## 2013-02-19 ENCOUNTER — Encounter (HOSPITAL_COMMUNITY): Payer: 59 | Attending: Hematology and Oncology

## 2013-02-19 DIAGNOSIS — D72819 Decreased white blood cell count, unspecified: Secondary | ICD-10-CM

## 2013-02-19 DIAGNOSIS — D649 Anemia, unspecified: Secondary | ICD-10-CM

## 2013-02-19 LAB — CBC WITH DIFFERENTIAL/PLATELET
Basophils Absolute: 0 10*3/uL (ref 0.0–0.1)
Basophils Relative: 0 % (ref 0–1)
MCHC: 34 g/dL (ref 30.0–36.0)
Monocytes Absolute: 0.3 10*3/uL (ref 0.1–1.0)
Neutro Abs: 1.1 10*3/uL — ABNORMAL LOW (ref 1.7–7.7)
Neutrophils Relative %: 35 % — ABNORMAL LOW (ref 43–77)
RDW: 12.3 % (ref 11.5–15.5)

## 2013-02-19 NOTE — Progress Notes (Signed)
Labs drawn today for cbc/diff 

## 2013-02-23 ENCOUNTER — Ambulatory Visit (HOSPITAL_COMMUNITY): Payer: 59 | Admitting: Oncology

## 2013-02-23 NOTE — Progress Notes (Signed)
Majel Homer, MD [address] [place] [address]  Iron deficiency - Plan: CBC with Differential, Iron and TIBC, Ferritin  Anemia - Plan: CBC with Differential, Vitamin B12, Folate, Iron and TIBC, Ferritin  CURRENT THERAPY: Feraheme on 11/02/2011  INTERVAL HISTORY: Danielle Byrd 57 y.o. female returns for  regular  visit for followup of macrocytic anemia with leukopenia but negative bone marrow aspiration and biopsy which revealed no evidence for myelodysplastic syndrome etc. She did have decreased iron stores and we replaced her with IV iron. She temporarily felt better for a few weeks. She then fell back into sense of weakness and fatigue.  [facility] testing is negative as of 08/26/2012.   I personally reviewed and went over laboratory results with the patient.  Labs from 02/19/2013 demonstrate a leukopenia that is stable at 3.1, stable anemia with a Hgb of 11.9 g/dL, MCV WNL (but at the upper limits of normal) at 100.0, and platelet count WNL at 177.  Differential demonstrates a stable neutropenia at 1.1.  She denies any recurrent infections or hospitalizations.  She has not required antibiotics recently.   She continues to admit to fatigue and tiredness, but this is not related her her trace anemia of 11.9 g/dL.  I provided her education regarding anemia.  I suspect her tiredness is secondary to her chronic pain from fibromyalgia.    Hematologically, she denies any complaints and ROS questioning is negative.   Past Medical History  Diagnosis Date  . High cholesterol   . Kidney stones   . Hypothyroidism   . Cervical stenosis (uterine cervix)   . Fibromyalgia   . GERD (gastroesophageal reflux disease)   . Anxiety   . Constipation   . Arthritis   . Anemia   . Copper deficiency     has Kidney stones; GERD (gastroesophageal reflux disease); Abdominal pain, bilateral upper quadrant; H/O fibromyalgia; Constipation; Hyperlipemia; Anemia; and Iron deficiency on her problem  list.     is allergic to vicodin; codeine; and statins.  Danielle Byrd had no medications administered during this visit.  Past Surgical History  Procedure Laterality Date  . Cholecystectomy    . Abdominal hysterectomy    . Eye surgery    . Dilation and curettage of uterus    . Breast surgery Right     partial mastectomy  . Nissen fundoplication    . Colonoscopy N/A 07/22/2012    Procedure: COLONOSCOPY;  Surgeon: Malissa Hippo, MD;  Location: [facility];  Service: Endoscopy;  Laterality: N/A;  730    Denies any headaches, dizziness, double vision, fevers, chills, night sweats, nausea, vomiting, diarrhea, constipation, chest pain, heart palpitations, shortness of breath, blood in stool, black tarry stool, urinary pain, urinary burning, urinary frequency, hematuria.   PHYSICAL EXAMINATION  ECOG PERFORMANCE STATUS: 1 - Symptomatic but completely ambulatory  Filed Vitals:   02/24/13 0940  BP: 107/65  Pulse: 67  Temp: 97.8 F (36.6 C)  Resp: 14    GENERAL:alert, no distress, well nourished, well developed, comfortable, smiling and appears older than stated age. SKIN: skin color, texture, turgor are normal, no rashes or significant lesions HEAD: Normocephalic, No masses, lesions, tenderness or abnormalities EYES: normal, PERRLA, EOMI, Conjunctiva are pink and non-injected EARS: External ears normal OROPHARYNX:mucous membranes are moist  NECK: supple, no adenopathy, thyroid normal size, non-tender, without nodularity, no stridor, non-tender, trachea midline LYMPH:  no palpable lymphadenopathy BREAST:not examined LUNGS: clear to auscultation  HEART: regular rate & rhythm, no murmurs,  no gallops, S1 normal and S2 normal ABDOMEN:abdomen soft, non-tender and normal bowel sounds BACK: Back symmetric, no curvature. EXTREMITIES:less then 2 second capillary refill, no skin discoloration, no cyanosis  NEURO: alert & oriented x 3 with fluent speech, no focal motor/sensory deficits,  gait normal    LABORATORY DATA: CBC    Component Value Date/Time   WBC 3.1* 02/19/2013 0907   RBC 3.50* 02/19/2013 0907   HGB 11.9* 02/19/2013 0907   HCT 35.0* 02/19/2013 0907   PLT 177 02/19/2013 0907   MCV 100.0 02/19/2013 0907   MCH 34.0 02/19/2013 0907   MCHC 34.0 02/19/2013 0907   RDW 12.3 02/19/2013 0907   LYMPHSABS 1.7 02/19/2013 0907   MONOABS 0.3 02/19/2013 0907   EOSABS 0.0 02/19/2013 0907   BASOSABS 0.0 02/19/2013 0907      PATHOLOGY:  10/24/2011  Diagnosis Bone Marrow, Aspirate,Biopsy, and Clot - NORMOCELLULAR BONE MARROW FOR AGE WITH TRILINEAGE HEMATOPOIESIS. - SEVERAL LYMPHOID AGGREGATES PRESENT. - SEE COMMENT. PERIPHERAL BLOOD: - NORMOCYTIC-NORMOCHROMIC ANEMIA. - LEUKOPENIA. Diagnosis Note The bone marrow is normocellular for age with trilineage hematopoiesis and nonspecific changes. Significant dyspoiesis is not seen. There are several reactive-appearing lymphoid aggregates present, mostly consisting of small lymphoid cells. Flow cytometric analysis failed to show any monoclonal B-cell population or abnormal T-cell phenotype. Correlation with cytogenetic studies is recommended. (BNS:eps Nov 16, 2011) Guerry Bruin MD Pathologist, Electronic Signature (Case signed 11/16/11)  Interpretation Bone Marrow Flow Cytometry - NO MONOCLONAL B-CELL POPULATION OR ABNORMAL T-CELL PHENOTYPE IDENTIFIED. Guerry Bruin MD Pathologist, Electronic Signature (Case signed Nov 16, 2011)     ASSESSMENT:  1. Anemia, macrocytic.  Negative bone marrow aspiration and biopsy in July 2013.  Stable. 2. Iron deficiency, on bone marrow aspiration and biopsy.  S/P replacement via 1020 mg of IV Feraheme on 11/02/2011 with transient improvement in sense of well-being 3. Leukopenia, stable 4. Neutropenia, stable 5. Fatigue and tiredness 6. Fibromyalgia  Patient Active Problem List   Diagnosis Date Noted  . Anemia 10/31/2011  . Iron deficiency 10/31/2011  . GERD (gastroesophageal  reflux disease) 05/14/2011  . Abdominal pain, bilateral upper quadrant 05/14/2011  . H/O fibromyalgia 05/14/2011  . Constipation 05/14/2011  . Hyperlipemia 05/14/2011  . Kidney stones 01/11/2011     PLAN: 1. I personally reviewed and went over laboratory results with the patient. 2. I personally reviewed and went over pathology results with the patient. 3. Influenza vaccine is not indicated due to her allergy to eggs. 4. Labs in 6 months: CBC diff, anemia panel 5. Return in 6 months for follow-up.   THERAPY PLAN:  This lady complains of tiredness and fatigue, but this is unrelated to her CBC which demonstrates a Hgb of 11.9 g/dL.  Her cause of anemia and leukopenia is unknown at this time and has not clearly identified itself, in light of a normal bone marrow aspiration and biopsy.    All questions were answered. The patient knows to call the clinic with any problems, questions or concerns. We can certainly see the patient much sooner if necessary.  Patient and plan discussed with Dr. Alla German and he is in agreement with the aforementioned.   KEFALAS,THOMAS

## 2013-02-24 ENCOUNTER — Encounter (HOSPITAL_COMMUNITY): Payer: Self-pay | Admitting: Oncology

## 2013-02-24 ENCOUNTER — Encounter (HOSPITAL_BASED_OUTPATIENT_CLINIC_OR_DEPARTMENT_OTHER): Payer: 59 | Admitting: Oncology

## 2013-02-24 VITALS — BP 107/65 | HR 67 | Temp 97.8°F | Resp 14 | Wt 123.0 lb

## 2013-02-24 DIAGNOSIS — IMO0001 Reserved for inherently not codable concepts without codable children: Secondary | ICD-10-CM

## 2013-02-24 DIAGNOSIS — D72819 Decreased white blood cell count, unspecified: Secondary | ICD-10-CM

## 2013-02-24 DIAGNOSIS — E611 Iron deficiency: Secondary | ICD-10-CM

## 2013-02-24 DIAGNOSIS — R5381 Other malaise: Secondary | ICD-10-CM

## 2013-02-24 DIAGNOSIS — D709 Neutropenia, unspecified: Secondary | ICD-10-CM

## 2013-02-24 DIAGNOSIS — D649 Anemia, unspecified: Secondary | ICD-10-CM

## 2013-02-24 DIAGNOSIS — D509 Iron deficiency anemia, unspecified: Secondary | ICD-10-CM

## 2013-02-24 DIAGNOSIS — G8929 Other chronic pain: Secondary | ICD-10-CM

## 2013-02-24 NOTE — Patient Instructions (Signed)
[facility] Discharge Instructions  RECOMMENDATIONS MADE BY THE CONSULTANT AND ANY TEST RESULTS WILL BE SENT TO YOUR REFERRING PHYSICIAN.  EXAM FINDINGS BY THE PHYSICIAN TODAY AND SIGNS OR SYMPTOMS TO REPORT TO CLINIC OR PRIMARY PHYSICIAN: Exam and findings as discussed by Dellis Anes, PA-C.  Think your symptoms are related to your fibromyalgia.  Your blood counts were good.  MEDICATIONS PRESCRIBED:  none  INSTRUCTIONS/FOLLOW-UP: Follow-up in 6 months.  Thank you for choosing [facility] to provide your oncology and hematology care.  To afford each patient quality time with our providers, please arrive at least 15 minutes before your scheduled appointment time.  With your help, our goal is to use those 15 minutes to complete the necessary work-up to ensure our physicians have the information they need to help with your evaluation and healthcare recommendations.    Effective January 1st, 2014, we ask that you re-schedule your appointment with our physicians should you arrive 10 or more minutes late for your appointment.  We strive to give you quality time with our providers, and arriving late affects you and other patients whose appointments are after yours.    Again, thank you for choosing [facility].  Our hope is that these requests will decrease the amount of time that you wait before being seen by our physicians.       _____________________________________________________________  Should you have questions after your visit to [facility], please contact our office at [phone] between the hours of 8:30 a.m. and 5:00 p.m.  Voicemails left after 4:30 p.m. will not be returned until the following business day.  For prescription refill requests, have your pharmacy contact our office with your prescription refill request.

## 2013-03-18 ENCOUNTER — Encounter (INDEPENDENT_AMBULATORY_CARE_PROVIDER_SITE_OTHER): Payer: Self-pay | Admitting: *Deleted

## 2013-06-30 ENCOUNTER — Ambulatory Visit (INDEPENDENT_AMBULATORY_CARE_PROVIDER_SITE_OTHER): Payer: 59 | Admitting: Internal Medicine

## 2013-07-07 ENCOUNTER — Encounter (INDEPENDENT_AMBULATORY_CARE_PROVIDER_SITE_OTHER): Payer: Self-pay | Admitting: Internal Medicine

## 2013-07-07 ENCOUNTER — Ambulatory Visit (INDEPENDENT_AMBULATORY_CARE_PROVIDER_SITE_OTHER): Payer: 59 | Admitting: Internal Medicine

## 2013-07-07 VITALS — BP 128/76 | HR 72 | Temp 97.7°F | Resp 18 | Ht 65.5 in | Wt 121.3 lb

## 2013-07-07 DIAGNOSIS — K5900 Constipation, unspecified: Secondary | ICD-10-CM

## 2013-07-07 DIAGNOSIS — K5909 Other constipation: Secondary | ICD-10-CM

## 2013-07-07 DIAGNOSIS — K219 Gastro-esophageal reflux disease without esophagitis: Secondary | ICD-10-CM

## 2013-07-07 MED ORDER — METOCLOPRAMIDE HCL 10 MG PO TABS: 10.0000 mg | ORAL_TABLET | Freq: Every day | ORAL | Status: DC

## 2013-07-07 NOTE — Progress Notes (Signed)
Presenting complaint;  Followup for GERD and constipation.  Subjective:  Patient is 58 year old Caucasian female who presents for scheduled visit. She was last seen in February 2013. She has chronic constipation. On her last visit she was begun on Linzess which she stopped after a few weeks because it had not helped. She is and to control this problem by eating fiber rich foods and drinks a few ounces of water every morning. She is taking bisacodyl no more than couple times a month. Her bowels move 2-6 times a week. She has intermittent left-sided abdominal pain which is always eased with defecation. She feels her reflux symptoms are not well controlled. She wakes up every morning with burning in her throat and feels it is scalded. She denies heartburn regurgitation dysphagia hoarseness or chronic cough. She has chronic pain due to fibromyalgia and is doing aqua therapy 3-4 times a week which helps. Patient states she was begun on Copper for neutropenia but it has not helped. Patient has copy of recent blood work for review.   Current Medications: Outpatient Encounter Prescriptions as of 07/07/2013  Medication Sig  . ALPRAZolam (XANAX) 0.25 MG tablet Take 0.25 mg by mouth 3 (three) times daily.   . Biotin 7500 MCG TABS Take 7,500 mcg by mouth daily.   . Bisacodyl (CORRECTOL PO) Take 1 tablet by mouth as needed.  . calcium carbonate (OS-CAL) 600 MG TABS Take 600 mg by mouth 2 (two) times daily with a meal.  . Cholecalciferol (VITAMIN D3) 2000 UNITS TABS Take 4,000 Units by mouth daily.   . Coconut Oil 1000 MG CAPS Take 1,000 mg by mouth daily.  . Copper Gluconate (COPPER CAPS) 2 MG CAPS Take 2 tablets by mouth daily.  . Cyanocobalamin (VITAMIN B-12 IJ) Inject as directed. Patient takes monthly  . diclofenac sodium (VOLTAREN) 1 % GEL Apply 1 g topically as needed.  . Iron-Vitamin C (VITRON-C PO) Take by mouth daily.  Marland Kitchen ketotifen (THERA TEARS ALLERGY) 0.025 % ophthalmic solution Place 1 drop into  both eyes 2 (two) times daily.  Javier Docker Oil 500 MG CAPS Take 1,000 mg by mouth daily.   Marland Kitchen levothyroxine (SYNTHROID, LEVOTHROID) 50 MCG tablet Take 50 mcg by mouth daily.  Marland Kitchen oxyCODONE-acetaminophen (PERCOCET/ROXICET) 5-325 MG per tablet Take 1 tablet by mouth as needed for severe pain.  . RABEprazole (ACIPHEX) 20 MG tablet TAKE 1 TABLET BY MOUTH DAILY  . traZODone (DESYREL) 50 MG tablet Take 50 mg by mouth at bedtime.   . vitamin C (ASCORBIC ACID) 500 MG tablet Take 500 mg by mouth daily.    . [DISCONTINUED] aspirin 81 MG EC tablet Take 81 mg by mouth daily. Swallow whole.  . [DISCONTINUED] co-enzyme Q-10 30 MG capsule Take 60 mg by mouth 3 (three) times daily.  . [DISCONTINUED] Flaxseed, Linseed, 1000 MG CAPS Take 1,200 mg by mouth.   . [DISCONTINUED] Linaclotide (LINZESS) 290 MCG CAPS Take 1 capsule by mouth as needed.   . [DISCONTINUED] magnesium gluconate (MAGONATE) 500 MG tablet Take 500 mg by mouth 2 (two) times daily.  . [DISCONTINUED] Manganese 50 MG TABS Take 1 tablet by mouth daily.  . [DISCONTINUED] meloxicam (MOBIC) 15 MG tablet Take 15 mg by mouth daily.  . [DISCONTINUED] Menthol 3.1 % GEL Apply topically. Patient uses a roll on Perform pain relieving, form of this Menthol  . [DISCONTINUED] oxyCODONE-acetaminophen (PERCOCET) 10-325 MG per tablet Take 1 tablet by mouth every 6 (six) hours as needed.   . [DISCONTINUED] pilocarpine (SALAGEN) 5  MG tablet Take 5 mg by mouth daily.  . [DISCONTINUED] Probiotic Product (PROBIOTIC DAILY PO) Take by mouth.  . [DISCONTINUED] thiamine 100 MG tablet Take 100 mg by mouth daily.  . [DISCONTINUED] tobramycin-dexamethasone (TOBRADEX) ophthalmic solution Place 1 drop into both eyes 3 (three) times daily.     Objective: Blood pressure 128/76, pulse 72, temperature 97.7 F (36.5 C), temperature source Oral, resp. rate 18, height 5' 5.5" (1.664 m), weight 121 lb 4.8 oz (55.021 kg). The patient is alert and in no acute distress. Conjunctiva is pink.  Sclera is nonicteric Oropharyngeal mucosa is normal. No neck masses or thyromegaly noted. Cardiac exam with regular rhythm normal S1 and S2. No murmur or gallop noted. Lungs are clear to auscultation. Abdomen symmetrical and soft with mild tenderness at LLQ. No organomegaly or masses.  No LE edema or clubbing noted.  Labs/studies Results: Lab data from 06/09/2013. WBC 2.8, H&H 12.1 and 34.7 and platelet count is 215K Differential pertinent for 30.6 neutrophils with absolute count of 900. Bilirubin 0.4, AP 67, AST 31, ALT 28, total protein 6.3 and albumin of  0.4 and calcium 9.2. Electrolytes are normal BUN 10 and creatinine 0.83.  Assessment:  #1. Chronic constipation. She's been tried on radius laxatives but without satisfactory results. For now she is doing well with her dietary measures and when necessary use of bisacodyl. She is up-to-date on screening for CRC. Last colonoscopy was in April 2014 revealing some hemorrhoids and small lipoma at the hepatic flexure. #2. GERD. She is having daily atypical symptoms in the form of burning in her throat. #3. Leukopenia of unknown etiology.   Plan:  Metoclopramide 10 mg by mouth each bedtime. Prescription given for 30 doses. Patient informed of potential side effects. If she has any she'll stop medication and let us know otherwise she'll call with progress report in one week. Call if current regiment quits working for constipation. Office visit in one year.

## 2013-07-07 NOTE — Patient Instructions (Signed)
Stop metoclopramide if you have any side effects and call office. Progress report in one month.

## 2013-07-08 DIAGNOSIS — D72819 Decreased white blood cell count, unspecified: Secondary | ICD-10-CM | POA: Insufficient documentation

## 2013-07-13 ENCOUNTER — Encounter (INDEPENDENT_AMBULATORY_CARE_PROVIDER_SITE_OTHER): Payer: Self-pay

## 2013-07-19 ENCOUNTER — Other Ambulatory Visit (INDEPENDENT_AMBULATORY_CARE_PROVIDER_SITE_OTHER): Payer: Self-pay | Admitting: Internal Medicine

## 2013-07-31 ENCOUNTER — Telehealth (INDEPENDENT_AMBULATORY_CARE_PROVIDER_SITE_OTHER): Payer: Self-pay | Admitting: *Deleted

## 2013-07-31 NOTE — Telephone Encounter (Signed)
[name] has been taking Reglan 21 days for Acid Reflux. It has been giving her headaches that gradually been getting worse. It is now effecting her eye cite. Should she stop this medicine? Her return phone number is [phone].

## 2013-07-31 NOTE — Telephone Encounter (Signed)
Per Dr.Rehman the patient is to stop the Reglan. Patient called and a message was left.

## 2013-08-25 ENCOUNTER — Other Ambulatory Visit (HOSPITAL_COMMUNITY): Payer: 59

## 2013-08-26 ENCOUNTER — Ambulatory Visit (HOSPITAL_COMMUNITY): Payer: 59 | Admitting: Oncology

## 2013-09-01 ENCOUNTER — Other Ambulatory Visit (HOSPITAL_COMMUNITY): Payer: 59

## 2013-09-03 ENCOUNTER — Ambulatory Visit (HOSPITAL_COMMUNITY): Payer: 59 | Admitting: Oncology

## 2013-10-27 ENCOUNTER — Ambulatory Visit (INDEPENDENT_AMBULATORY_CARE_PROVIDER_SITE_OTHER): Payer: 59 | Admitting: Neurology

## 2013-10-27 ENCOUNTER — Encounter: Payer: Self-pay | Admitting: Neurology

## 2013-10-27 VITALS — BP 135/81 | HR 65 | Ht 65.5 in | Wt 125.4 lb

## 2013-10-27 DIAGNOSIS — R519 Headache, unspecified: Secondary | ICD-10-CM | POA: Insufficient documentation

## 2013-10-27 DIAGNOSIS — R51 Headache: Secondary | ICD-10-CM

## 2013-10-27 NOTE — Patient Instructions (Signed)
1. Headache diary 2. Discontinue the coconut oil and neurontin 3. Continue other home meds 4. Take tylenol or fioricet for acute headache, use at the very beginning of the headache 5. Will monitor headache frequency to decide on preventive medications 6. Follow up your PCP for HLD consider to use fibrates for cholesterol control 7. Follow up in clinic in 2 months.

## 2013-10-27 NOTE — Progress Notes (Signed)
NEUROLOGY CLINIC NEW PATIENT NOTE  NAME: Danielle Byrd DOB: 04-25-1955  I saw [name] as a new patient in the neurovascular clinic today regarding  Chief Complaint  Patient presents with  . Cerebrovascular Accident    NP#8  .  HPI: Danielle Byrd is a 58 y.o. female with PMH of headache, HDL, fibromyalgia and chronic anemia who presents for abnormal CT and MRI findings. She stated that she had bad headache starting on 10/09/13. The headache was different from her previous mild headache. It was intense, at left frontal, throbbing, everything bothers her including light and sound and smell, she also felt nauseous but no vomiting. She also had visual changes such as everything she saw became hairy. She denies any weakness, numbness, LOC, seizure, slurry speech. HA continued until 7/16 when she went to ED and was given two injections (not sure the name, but seems like demerol) for the HA. Her headache gradually subsided. Once HA better, her blurry vision gets better too. Had CT head was told to have some concerns for stroke and was scheduled for MRI and was discharged. She went back to get MRI done on 10/16/13, which was negative for stroke.   Since then, she had two more episode of HA. One is on 7/22 lasted short time and relieved with fioricet. The second day 7/23, she had HA again and had to leave work due to HA, which resolved overnight, but fioricet did not work. Once HA bad, she will have blurry vision, once headache gone, blurry vision gone. At this visit, she does not have HA and no blurry vision. But she still feels like a "axe" feeling on her left eye constantly there.  She has hx of HA but pt stated that was just her normal headache which is different from above HA. She can not say how many years she had her normal HA, but it was once a month, lasting about 2 hours and tylenol helps. No N/V, no phono or photophobia, tolerable, never had to leave work.   She has hx of  fibromyalgia and on neurontin but does not work however give her side effect of drowsy and sleepy. She also has cervical DJD and anemia for which she is following with hematology and GI. So far Hb stable.   Home meds include Xanax tid, coconut oil capsule, B12, levothyroxine, trazodone, neurontin and fioricet.   She works as Solicitor, denies smoking drinking or illlict drugs.  Outside reports reviewed: MRI images and reports, outside records.  Past Medical History  Diagnosis Date  . High cholesterol   . Kidney stones   . Hypothyroidism   . Cervical stenosis (uterine cervix)   . Fibromyalgia   . GERD (gastroesophageal reflux disease)   . Anxiety   . Constipation   . Arthritis   . Anemia   . Copper deficiency    Past Surgical History  Procedure Laterality Date  . Cholecystectomy    . Abdominal hysterectomy    . Eye surgery    . Dilation and curettage of uterus    . Breast surgery Right     partial mastectomy  . Nissen fundoplication    . Colonoscopy N/A 07/22/2012    Procedure: COLONOSCOPY;  Surgeon: [name], MD;  Location: [facility];  Service: Endoscopy;  Laterality: N/A;  730   Family History  Problem Relation Age of Onset  . Diabetes Mother   . Hypertension Mother   . Anemia Mother   .  Colon cancer Father   . Cancer Father 64    colon cancer   Current Outpatient Prescriptions  Medication Sig Dispense Refill  . ALPRAZolam (XANAX) 0.25 MG tablet Take 0.25 mg by mouth 3 (three) times daily.       . Biotin 5000 MCG TABS Take 5,000 tablets by mouth daily.      . Bisacodyl (CORRECTOL PO) Take 1 tablet by mouth as needed.      . butalbital-acetaminophen-caffeine (FIORICET WITH CODEINE) 50-325-40-30 MG per capsule Take 1 capsule by mouth every 4 (four) hours as needed for headache.      . calcium carbonate (OS-CAL) 600 MG TABS Take 600 mg by mouth 2 (two) times daily with a meal.      . Cholecalciferol (SM VITAMIN D3) 4000 UNITS CAPS Take 1 capsule by mouth  daily.      . Coconut Oil 1000 MG CAPS Take 1,000 mg by mouth daily.      . Cyanocobalamin (VITAMIN B-12 IJ) Inject as directed. Patient takes monthly      . diclofenac sodium (VOLTAREN) 1 % GEL Apply 1 g topically as needed.      . Iron-Vitamin C (VITRON-C PO) Take by mouth daily.      Marland Kitchen levothyroxine (SYNTHROID, LEVOTHROID) 50 MCG tablet Take 50 mcg by mouth daily.      . methocarbamol (ROBAXIN) 500 MG tablet Take 500 mg by mouth 3 (three) times daily.      . metoCLOPramide (REGLAN) 10 MG tablet Take 1 tablet (10 mg total) by mouth at bedtime.  30 tablet  0  . Polyethyl Glycol-Propyl Glycol (SYSTANE) 0.4-0.3 % GEL Apply to eye at bedtime.      . RABEprazole (ACIPHEX) 20 MG tablet TAKE 1 TABLET BY MOUTH DAILY  30 tablet  5  . traZODone (DESYREL) 50 MG tablet Take 50 mg by mouth at bedtime.       . vitamin C (ASCORBIC ACID) 500 MG tablet Take 500 mg by mouth daily.         No current facility-administered medications for this visit.   Allergies  Allergen Reactions  . Reglan [Metoclopramide]     Per the patient it caused headaches that was getting worse, also had started to bother her vision.  . Vicodin [Hydrocodone-Acetaminophen] Diarrhea    Nausea and vomiting  . Codeine Itching  . Penicillins Hives    Also experienced itching  . Statins    History   Social History  . Marital Status: Single    Spouse Name: N/A    Number of Children: 0  . Years of Education: BS   Occupational History  .     Social History Main Topics  . Smoking status: Never Smoker   . Smokeless tobacco: Never Used  . Alcohol Use: Yes     Comment: occassional  . Drug Use: No  . Sexual Activity: No   Other Topics Concern  . Not on file   Social History Narrative   Patient lives at home with chacha   Patient right handed   Patient drinks tea    Review of Systems Full 14 system review of systems performed and notable only for those listed, all others are neg:  Constitutional: fatigue    Cardiovascular: N/A  Ear/Nose/Throat: N/A  Skin: N/A  Eyes: blurry vision  Respiratory: N/A  Gastroitestinal: N/A  Hematology/Lymphatic: anemia, easy bruising  Endocrine: feeling cold, increased thirst  Musculoskeletal: joint pain and aching muscles  Allergy/Immunology: N/A  Neurological:  HA, sleepiness, restless leg  Psychiatric: decreased energy   Physical Exam  Filed Vitals:   10/27/13 1135  BP: 135/81  Pulse: 65   General - Well nourished, well developed, in no apparent distress.  Ophthalmologic - Sharp disc margins OU, no papilledema.  Cardiovascular - Regular rate and rhythm with no murmur. Carotid pulses were 2+ without bruits .   Neck - supple, no nuchal rigidity .  Mental Status -  Level of arousal and orientation to time, place, and person were intact. Language including expression, naming, repetition, comprehension, reading, and writing was assessed and found intact. Attention span and concentration were normal. Recent and remote memory were intact. Fund of Knowledge was assessed and was intact.  Cranial Nerves II - XII - II - Vision intact OU. III, IV, VI - Extraocular movements intact. V - Facial sensation intact bilaterally. VII - Facial movement intact bilaterally. VIII - Hearing & vestibular intact bilaterally. X - Palate elevates symmetrically. XI - Chin turning & shoulder shrug intact bilaterally. XII - Tongue protrusion intact.  Motor Strength - The patient's strength was normal in all extremities and pronator drift was absent.  Bulk was normal and fasciculations were absent.   Motor Tone - Muscle tone was assessed at the neck and appendages and was normal.  Reflexes - The patient's reflexes were normal in all extremities and she had no pathological reflexes.  Sensory - Light touch, temperature/pinprick, vibration and proprioception, and Romberg testing were assessed and were normal.    Coordination - The patient had normal movements in the  hands and feet with no ataxia or dysmetria.  Tremor was absent.  Gait and Station - The patient's transfers, posture, gait, station, and turns were observed as normal.   Imaging MRI 7/17 review personally No acute ischemic stroke. There are multiple subcortical and white matter small, scattered foci of T2 hyperintensity bilaterally. Nonspecific, could represent small vessel WM changes but also common in migraine patients.  Lab Review none  Assessments: NIHSS: 0 Rankin: 0   Assessment and Plan:   In summary, EOLA WALDREP presents with headache episodes which are different than her usual headache in the past. Neuro exam normal. Sharp discs bilaterally. No temporal artery hardening or jaw claudication. MRI negative for stroke but also showed nonspecific WM changes consistent with migraine or SMWM changes. From her presentation, it is more consistent with migraine headache and status migrainosus. So far she is HA free and seems fioricet and tylenol helped for HA. I would not start any preventive meds now as she needs a headache diary to monitor the pattern of headache. If very frequent or disabling, I will put her on preventive meds. At the meantime, use fioriect and tylenol for abortive medications. Discontinue coconut oil as it may be an agent as HA triggers.   She stated that neurontin is not helpful for her fibromyalgia and causing her sleepiness, I would discontinue that also.   She has minimal stroke risk factor, but has HDL, she stated that she is allergic to statin. So I would suggest fibrates if she can tolerate.   RTC in 2 months with headache diary.   Thank you very much for the opportunity to participate in the care of this patient.  Please do not hesitate to call if any questions or concerns arise.  Patient Instructions  1. Headache diary 2. Discontinue the coconut oil and neurontin 3. Continue other home meds 4. Take tylenol or fioricet for acute headache, use at the  very beginning of the headache 5. Will monitor headache frequency to decide on preventive medications 6. Follow up your PCP for HLD consider to use fibrates for cholesterol control 7. Follow up in clinic in 2 months.   Rosalin Hawking, MD PhD [facility] [address] [place], [place] [address] [phone]

## 2013-10-28 ENCOUNTER — Encounter: Payer: Self-pay | Admitting: Neurology

## 2013-10-28 NOTE — Progress Notes (Signed)
Faxed per Dr Erlinda Hong to referring physician,Dr [facility]

## 2014-01-14 ENCOUNTER — Other Ambulatory Visit (INDEPENDENT_AMBULATORY_CARE_PROVIDER_SITE_OTHER): Payer: Self-pay | Admitting: Internal Medicine

## 2014-01-21 ENCOUNTER — Ambulatory Visit: Payer: 59 | Admitting: Neurology

## 2014-01-28 ENCOUNTER — Ambulatory Visit (INDEPENDENT_AMBULATORY_CARE_PROVIDER_SITE_OTHER): Payer: 59 | Admitting: Neurology

## 2014-01-28 ENCOUNTER — Encounter: Payer: Self-pay | Admitting: Neurology

## 2014-01-28 VITALS — BP 109/63 | HR 70 | Ht 65.5 in | Wt 126.6 lb

## 2014-01-28 DIAGNOSIS — G44039 Episodic paroxysmal hemicrania, not intractable: Secondary | ICD-10-CM

## 2014-01-28 MED ORDER — ELETRIPTAN HYDROBROMIDE 20 MG PO TABS: 20.0000 mg | ORAL_TABLET | ORAL | Status: DC | PRN

## 2014-01-28 NOTE — Patient Instructions (Signed)
I had a long discussion with the patient with regards to her recurrent left lower vital and hemicranial paroxysmal headaches. I recommend trial of Relpax 20 mg of symptomatic relief. May repeat a second dose after one hour no relief. We will hold off on trying indomethacin because of her history of gastroesophageal reflux disorder but will consider it is Relpax is not effective. Check repeat MRI scan of the brain and MRA of the brain. Keep a headache diary. Return for followup in 3 months or earlier if necessary.

## 2014-01-28 NOTE — Progress Notes (Signed)
NEUROLOGY CLINIC FOLLOW UP  PATIENT NOTE  NAME: Danielle Byrd DOB: [birth year]   Chief Complaint  Patient presents with  . Follow-up    RM 4  . Headache  .  HPI: Prior Note Dr Erlinda Hong 10/28/13 : Danielle Byrd is a 58 y.o. female with PMH of headache, HDL, fibromyalgia and chronic anemia who presents for abnormal CT and MRI findings. She stated that she had bad headache starting on 10/09/13. The headache was different from her previous mild headache. It was intense, at left frontal, throbbing, everything bothers her including light and sound and smell, she also felt nauseous but no vomiting. She also had visual changes such as everything she saw became hairy. She denies any weakness, numbness, LOC, seizure, slurry speech. HA continued until 7/16 when she went to ED and was given two injections (not sure the name, but seems like demerol) for the HA. Her headache gradually subsided. Once HA better, her blurry vision gets better too. Had CT head was told to have some concerns for stroke and was scheduled for MRI and was discharged. She went back to get MRI done on 10/16/13, which was negative for stroke.   Since then, she had two more episode of HA. One is on 7/22 lasted short time and relieved with fioricet. The second day 7/23, she had HA again and had to leave work due to HA, which resolved overnight, but fioricet did not work. Once HA bad, she will have blurry vision, once headache gone, blurry vision gone. At this visit, she does not have HA and no blurry vision. But she still feels like a "axe" feeling on her left eye constantly there.  She has hx of HA but pt stated that was just her normal headache which is different from above HA. She can not say how many years she had her normal HA, but it was once a month, lasting about 2 hours and tylenol helps. No N/V, no phono or photophobia, tolerable, never had to leave work.   She has hx of fibromyalgia and on neurontin but does not work however give  her side effect of drowsy and sleepy. She also has cervical DJD and anemia for which she is following with hematology and GI. So far Hb stable.   Home meds include Xanax tid, coconut oil capsule, B12, levothyroxine, trazodone, neurontin and fioricet.   She works as Solicitor, denies smoking drinking or illlict drugs.  Outside reports reviewed: MRI images and reports, outside records. Update 01/28/2014 : Patient is seen today upon her request and she requested change her provider to me as she felt Dr. Erlinda Hong had difficulty understanding her. She has had the onset of left hemicranial and of vital headaches since July of this year. She has had previous headaches in the past which may have been migrainous in nature but the current headaches are quite different. The headache begins with an ax-like sensation behind the left eye which in a matter of 20-30 minutes starts to involve a severe left hemicranial throbbing headache which is so bad that she has to stop what she is doing and 9 down. There is accompanying nausea vomiting light sensitivity. There is also increasing redness of the left eye as well as watering and light sensitivity. The headache will last for several hours. She was given Fioricet by Dr. Erlinda Hong which worked just for the first couple of times but is no longer working. She has found out that smelling ammonia seems to get  around his headache and she has been doing that of late. She has never tried to transfer his headaches. She is unable to identify specific triggers for the headaches. She has never been a migraine preventative medications. The headaches however seem to occur in clusters and can occur daily basis for several days then go away for weeks. She has kept a headache diary and as recorded 13 episodes of headaches since the last 13 weeks that she has seen Dr. Erlinda Hong. She denies any loss of vision, jaw claudication, myalgias, scalp tenderness. She had an outpatient MRI scan of the brain done on 10/16/13  which were personally reviewed shows and nonspecific white matter hyperintensities and postcontrast images were also negative. MRA of the brain was not obtained Past Medical History  Diagnosis Date  . High cholesterol   . Kidney stones   . Hypothyroidism   . Cervical stenosis (uterine cervix)   . Fibromyalgia   . GERD (gastroesophageal reflux disease)   . Anxiety   . Constipation   . Arthritis   . Anemia   . Copper deficiency    Past Surgical History  Procedure Laterality Date  . Cholecystectomy    . Abdominal hysterectomy    . Eye surgery    . Dilation and curettage of uterus    . Breast surgery Right     partial mastectomy  . Nissen fundoplication    . Colonoscopy N/A 07/22/2012    Procedure: COLONOSCOPY;  Surgeon: [name], MD;  Location: [facility];  Service: Endoscopy;  Laterality: N/A;  730   Family History  Problem Relation Age of Onset  . Diabetes Mother   . Hypertension Mother   . Anemia Mother   . Colon cancer Father   . Cancer Father 74    colon cancer   Current Outpatient Prescriptions  Medication Sig Dispense Refill  . ALPRAZolam (XANAX) 0.25 MG tablet Take 0.25 mg by mouth 3 (three) times daily.       . Biotin 5000 MCG TABS Take 5,000 tablets by mouth daily.      . Bisacodyl (CORRECTOL PO) Take 1 tablet by mouth as needed.      . calcium carbonate (OS-CAL) 600 MG TABS Take 600 mg by mouth 2 (two) times daily with a meal.      . Cholecalciferol (SM VITAMIN D3) 4000 UNITS CAPS Take 1 capsule by mouth daily.      . cyanocobalamin (,VITAMIN B-12,) 1000 MCG/ML injection Inject into the muscle.      . diclofenac sodium (VOLTAREN) 1 % GEL Apply 1 g topically as needed.      . Iron-Vitamin C (VITRON-C PO) Take by mouth daily.      Marland Kitchen levothyroxine (SYNTHROID, LEVOTHROID) 50 MCG tablet Take 50 mcg by mouth daily.      . methocarbamol (ROBAXIN) 500 MG tablet Take 500 mg by mouth 3 (three) times daily.      Marland Kitchen oxyCODONE-acetaminophen (PERCOCET/ROXICET) 5-325  MG per tablet Take by mouth.      Vladimir Faster Glycol-Propyl Glycol (SYSTANE) 0.4-0.3 % GEL Apply to eye at bedtime.      . RABEprazole (ACIPHEX) 20 MG tablet TAKE 1 TABLET BY MOUTH DAILY  30 tablet  5  . traZODone (DESYREL) 50 MG tablet Take 50 mg by mouth at bedtime.       . vitamin C (ASCORBIC ACID) 500 MG tablet Take 500 mg by mouth daily.        . butalbital-acetaminophen-caffeine (FIORICET WITH CODEINE)  50-325-40-30 MG per capsule Take 1 capsule by mouth every 4 (four) hours as needed for headache.      . eletriptan (RELPAX) 20 MG tablet Take 1 tablet (20 mg total) by mouth as needed for migraine or headache. One tablet by mouth at onset of headache. May repeat in 2 hours if headache persists or recurs.  10 tablet  0   No current facility-administered medications for this visit.   Allergies  Allergen Reactions  . Reglan [Metoclopramide]     Per the patient it caused headaches that was getting worse, also had started to bother her vision.  . Vicodin [Hydrocodone-Acetaminophen] Diarrhea    Nausea and vomiting  . Codeine Itching  . Hydrocodone-Acetaminophen Diarrhea    Nausea and vomiting  . Penicillins Hives    Also experienced itching  . Statins    History   Social History  . Marital Status: Single    Spouse Name: N/A    Number of Children: 0  . Years of Education: BS   Occupational History  .     Social History Main Topics  . Smoking status: Never Smoker   . Smokeless tobacco: Never Used  . Alcohol Use: Yes     Comment: occassional  . Drug Use: No  . Sexual Activity: No   Other Topics Concern  . Not on file   Social History Narrative   Patient lives at home with chacha   Patient right handed   Patient has a BS degree.   Patient drinks 1 tea daily.    Review of Systems Full 14 system review of systems performed and notable only for those listed, all others are neg:  Activity change, fatigue, highest itching, discharge, redness light sensitivity loss of vision  eye pain blurred vision. Cold intolerance, excessive thirst, constipation, headache, weakness, joint pain back pain aching muscles walking difficulty insomnia frequent walking snoring and depression.  Physical Exam  Filed Vitals:   01/28/14 1405  BP: 109/63  Pulse: 70    Awake alert. Afebrile. Head is nontraumatic. Neck is supple without bruit. Hearing is normal. Cardiac exam no murmur or gallop. Lungs are clear to auscultation. Distal pulses are well felt.   Neurological Exam ;  Awake  Alert oriented x 3. Normal speech and language.eye movements full without nystagmus.fundi show no papilloedema  . Vision acuity and fields appear normal. Hearing is normal. Palatal movements are normal. Face symmetric. Tongue midline. Normal strength, tone, reflexes and coordination. Normal sensation. Gait deferred. Imaging MRI 7/17 review personally No acute ischemic stroke. There are multiple subcortical and white matter small, scattered foci of T2 hyperintensity bilaterally. Nonspecific, could represent small vessel WM changes but also common in migraine patients.  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 Normal neurological exam and brain MRI scan     Assessment and Plan:   I    Patient Instructions  I had a long discussion with the patient with regards to her recurrent left lower vital and hemicranial paroxysmal headaches. I recommend trial of Relpax 20 mg of symptomatic relief. May repeat a second dose after one hour no relief. We will hold off on trying indomethacin because of her history of gastroesophageal reflux disorder but will consider it is Relpax is not effective. Check repeat MRI scan of the brain and MRA of the brain. Keep a headache diary. Return for followup in 3  months or earlier if necessary.     [facility] [address] [place], [place] [address] [phone]

## 2014-02-02 ENCOUNTER — Telehealth: Payer: Self-pay | Admitting: Neurology

## 2014-02-02 NOTE — Telephone Encounter (Signed)
I called 01/30/14 afternoon was kept on hold for 10 minutes and hung up finally.

## 2014-02-02 NOTE — Telephone Encounter (Signed)
mra head w/o pending peer to peer review, please call [phone] option #3 case #[id number] mra brain w/o approved  Notes given to Dr Leonie Man 01/29/14 for peer to peer

## 2014-02-04 NOTE — Telephone Encounter (Signed)
yes

## 2014-02-04 NOTE — Telephone Encounter (Signed)
do you want to proceed with the mri brain wo only that has been approved?

## 2014-02-17 ENCOUNTER — Telehealth: Payer: Self-pay | Admitting: Neurology

## 2014-02-17 NOTE — Telephone Encounter (Signed)
Patient requesting MRI results.  Please return call to mobile # [phone] first and if not available call home # [phone].

## 2014-02-19 NOTE — Telephone Encounter (Signed)
Spoke with patient she states MRI was done at [facility], informed patient that we would have to get results from [place] and call once we have those results. Patient verbalized understanding. [facility] radiology and request results faxed to our office. Once fax is received information will be given to patient.

## 2014-02-19 NOTE — Telephone Encounter (Signed)
MRI results came in with normal results, called patient and left message to call back on Monday to get results.

## 2014-03-16 NOTE — Telephone Encounter (Signed)
Patient calling for MRI results.

## 2014-03-16 NOTE — Telephone Encounter (Signed)
Called patient back and informed her of normal MRI results. Patient verbalized understanding and had no further questions or concerns.

## 2014-03-30 ENCOUNTER — Encounter (INDEPENDENT_AMBULATORY_CARE_PROVIDER_SITE_OTHER): Payer: Self-pay | Admitting: *Deleted

## 2014-05-04 ENCOUNTER — Ambulatory Visit: Payer: 59 | Admitting: Neurology

## 2014-07-08 ENCOUNTER — Ambulatory Visit (INDEPENDENT_AMBULATORY_CARE_PROVIDER_SITE_OTHER): Payer: 59 | Admitting: Internal Medicine

## 2014-07-12 ENCOUNTER — Ambulatory Visit (INDEPENDENT_AMBULATORY_CARE_PROVIDER_SITE_OTHER): Payer: Self-pay | Admitting: Internal Medicine

## 2014-07-20 ENCOUNTER — Encounter (INDEPENDENT_AMBULATORY_CARE_PROVIDER_SITE_OTHER): Payer: Self-pay | Admitting: Internal Medicine

## 2014-07-20 ENCOUNTER — Ambulatory Visit (INDEPENDENT_AMBULATORY_CARE_PROVIDER_SITE_OTHER): Payer: 59 | Admitting: Internal Medicine

## 2014-07-20 VITALS — BP 108/68 | HR 76 | Temp 98.3°F | Ht 65.5 in | Wt 123.3 lb

## 2014-07-20 DIAGNOSIS — K219 Gastro-esophageal reflux disease without esophagitis: Secondary | ICD-10-CM

## 2014-07-20 NOTE — Progress Notes (Addendum)
Subjective:    Patient ID: Danielle Byrd, female    DOB: 22-Jan-1956, 59 y.o.   MRN: [id number]  HPI 59 yr old white female here for a scheduled visit. She was last seen in April 2015 by Dr. Laural Golden. She has a hx of chronic constipation. She tells me she still has some acid reflux. She sleeps on her rt side. Usually occurs at night. She wakes up and it feels like it has been boiling in the back of her throat. Aciphex daily in the am.  Appetite is good. No weight. She has gained two pounds since her lat visit in 2015. No dysphagia. She has her HOB elevated.  She does eat after 7pm sometimes.  She usually has a BM every 1-3 days.  She eats lots of fiber. She takes a stool softener about once a month. No abdominal pain.    06/25/2014 WBC 3.5,H and H 34.8 and 11.2, Platelet ct 165, Vitamin B12 624, NA 238, K 3.7, AST 25, ALT 20, ALP 57, Total bili 0.7,. Albumin 3.9 TSH 1.84  Her last colonoscopy was in 2008  normal except for external hemorrhoids.  Review of Systems Single. No children.    Past Medical History  Diagnosis Date  . High cholesterol   . Kidney stones   . Hypothyroidism   . Cervical stenosis (uterine cervix)   . Fibromyalgia   . GERD (gastroesophageal reflux disease)   . Anxiety   . Constipation   . Arthritis   . Anemia   . Copper deficiency     Past Surgical History  Procedure Laterality Date  . Cholecystectomy    . Abdominal hysterectomy    . Eye surgery    . Dilation and curettage of uterus    . Breast surgery Right     partial mastectomy  . Nissen fundoplication    . Colonoscopy N/A 07/22/2012    Procedure: COLONOSCOPY;  Surgeon: [name], MD;  Location: [facility];  Service: Endoscopy;  Laterality: N/A;  730    Allergies  Allergen Reactions  . Reglan [Metoclopramide]     Per the patient it caused headaches that was getting worse, also had started to bother her vision.  . Vicodin [Hydrocodone-Acetaminophen] Diarrhea    Nausea and vomiting    . Codeine Itching  . Hydrocodone-Acetaminophen Diarrhea    Nausea and vomiting  . Penicillins Hives    Also experienced itching  . Statins     Current Outpatient Prescriptions on File Prior to Visit  Medication Sig Dispense Refill  . ALPRAZolam (XANAX) 0.25 MG tablet Take 0.25 mg by mouth 3 (three) times daily.     . Biotin 5000 MCG TABS Take 5,000 tablets by mouth daily.    . Bisacodyl (CORRECTOL PO) Take 1 tablet by mouth as needed.    . butalbital-acetaminophen-caffeine (FIORICET WITH CODEINE) 50-325-40-30 MG per capsule Take 1 capsule by mouth every 4 (four) hours as needed for headache.    . calcium carbonate (OS-CAL) 600 MG TABS Take 600 mg by mouth 2 (two) times daily with a meal.    . Cholecalciferol (SM VITAMIN D3) 4000 UNITS CAPS Take 1 capsule by mouth daily. 5000    . cyanocobalamin (,VITAMIN B-12,) 1000 MCG/ML injection Inject into the muscle. Twice a month    . diclofenac sodium (VOLTAREN) 1 % GEL Apply 1 g topically as needed.    . Iron-Vitamin C (VITRON-C PO) Take by mouth daily.    Marland Kitchen levothyroxine (SYNTHROID, LEVOTHROID) 50  MCG tablet Take 50 mcg by mouth daily.    . methocarbamol (ROBAXIN) 500 MG tablet Take 500 mg by mouth 3 (three) times daily.    Marland Kitchen oxyCODONE-acetaminophen (PERCOCET/ROXICET) 5-325 MG per tablet Take by mouth.    Vladimir Faster Glycol-Propyl Glycol (SYSTANE) 0.4-0.3 % GEL Apply to eye at bedtime.    . RABEprazole (ACIPHEX) 20 MG tablet TAKE 1 TABLET BY MOUTH DAILY 30 tablet 5  . traZODone (DESYREL) 50 MG tablet Take 50 mg by mouth at bedtime.     . vitamin C (ASCORBIC ACID) 500 MG tablet Take 500 mg by mouth daily.       No current facility-administered medications on file prior to visit.     Objective:   Physical Exam Blood pressure 108/68, pulse 76, temperature 98.3 F (36.8 C), height 5' 5.5" (1.664 m), weight 123 lb 4.8 oz (55.929 kg). Alert and oriented. Skin warm and dry. Oral mucosa is moist.   . Sclera anicteric, conjunctivae is pink.  Thyroid not enlarged. No cervical lymphadenopathy. Lungs clear. Heart regular rate and rhythm.  Abdomen is soft. Bowel sounds are positive. No hepatomegaly. No abdominal masses felt. No tenderness.  No edema to lower extremities.          Assessment & Plan:  GERD. She will continue the Aciphex BID. No meals after 700pm. Constipation which is much better. OV 1 year.

## 2014-07-20 NOTE — Patient Instructions (Signed)
Aciphex BID. OV 1 yr. Continue the stool softener. Increase fiber in diet.

## 2014-07-26 ENCOUNTER — Telehealth (INDEPENDENT_AMBULATORY_CARE_PROVIDER_SITE_OTHER): Payer: Self-pay | Admitting: Internal Medicine

## 2014-07-26 DIAGNOSIS — K219 Gastro-esophageal reflux disease without esophagitis: Secondary | ICD-10-CM

## 2014-07-26 MED ORDER — RABEPRAZOLE SODIUM 20 MG PO TBEC: 20.0000 mg | DELAYED_RELEASE_TABLET | Freq: Every day | ORAL | Status: DC

## 2014-07-26 NOTE — Telephone Encounter (Signed)
Rx for Aciphex BID sent to her pharmacy.

## 2014-08-23 ENCOUNTER — Telehealth (INDEPENDENT_AMBULATORY_CARE_PROVIDER_SITE_OTHER): Payer: Self-pay | Admitting: *Deleted

## 2014-08-23 NOTE — Telephone Encounter (Signed)
Asees Manfredi said "she seen Terri on 07/17/14 and she increase her ACIPHEX to two times daily. She went to pick the Rx up and they had not received it. Called the office to let Terri know and as of 07/26/14 it had not been done. She finally sent it in and it was for once daily. She would like to know why Karna Christmas can not do what she tells her patients? Please see if she can get this called in." Return phone number is [phone] or [phone].

## 2014-08-24 ENCOUNTER — Other Ambulatory Visit (INDEPENDENT_AMBULATORY_CARE_PROVIDER_SITE_OTHER): Payer: Self-pay | Admitting: Internal Medicine

## 2014-08-24 MED ORDER — RABEPRAZOLE SODIUM 20 MG PO TBEC: 20.0000 mg | DELAYED_RELEASE_TABLET | Freq: Two times a day (BID) | ORAL | Status: DC

## 2014-08-24 NOTE — Telephone Encounter (Signed)
I have spoken with patient 

## 2014-08-24 NOTE — Telephone Encounter (Signed)
I have sent the Rx.

## 2014-08-24 NOTE — Telephone Encounter (Signed)
Rx for Aciphex sent to pharmacy

## 2014-09-02 ENCOUNTER — Telehealth (INDEPENDENT_AMBULATORY_CARE_PROVIDER_SITE_OTHER): Payer: Self-pay | Admitting: *Deleted

## 2014-09-02 NOTE — Telephone Encounter (Signed)
An approval was completed on the patient's Aciphex 20 mg -take 1 by mouth twice a day. #60 Approved coverage until 09-02-15. [id number] Both the pharmacy and the patient was called and made aware. Message was left on the patient's voicemail.

## 2014-12-26 ENCOUNTER — Other Ambulatory Visit (INDEPENDENT_AMBULATORY_CARE_PROVIDER_SITE_OTHER): Payer: Self-pay | Admitting: Internal Medicine

## 2015-04-12 ENCOUNTER — Encounter: Payer: Self-pay | Admitting: Neurology

## 2015-04-15 ENCOUNTER — Encounter (INDEPENDENT_AMBULATORY_CARE_PROVIDER_SITE_OTHER): Payer: Self-pay | Admitting: Internal Medicine

## 2015-05-11 ENCOUNTER — Encounter: Payer: Self-pay | Admitting: Neurology

## 2015-08-23 ENCOUNTER — Encounter (INDEPENDENT_AMBULATORY_CARE_PROVIDER_SITE_OTHER): Payer: Self-pay | Admitting: Internal Medicine

## 2015-08-23 ENCOUNTER — Ambulatory Visit (INDEPENDENT_AMBULATORY_CARE_PROVIDER_SITE_OTHER): Payer: Commercial Managed Care - HMO | Admitting: Internal Medicine

## 2015-08-23 VITALS — BP 112/80 | HR 68 | Temp 98.1°F | Resp 18 | Ht 65.5 in | Wt 127.2 lb

## 2015-08-23 DIAGNOSIS — K219 Gastro-esophageal reflux disease without esophagitis: Secondary | ICD-10-CM

## 2015-08-23 DIAGNOSIS — K5900 Constipation, unspecified: Secondary | ICD-10-CM

## 2015-08-23 NOTE — Patient Instructions (Signed)
Can use Dulcolax suppository on as needed basis as discussed or if you go two days without a bowel movement. Next colonoscopy would be in April 2019.

## 2015-08-23 NOTE — Progress Notes (Signed)
Presenting complaint;  Follow-up for chronic GERD and constipation.  Subjective:  Danielle Byrd is 60 year old Caucasian female who is here for scheduled visit. She was last seen in April 2016. She has gained 4 pounds since then. She says she has a good appetite. Heartburn is well controlled with PPI. She did try dropping PPI dose but it did not work. She is using sucralfate on as-needed basis but not every day. She denies dysphagia. She still has problems with constipation. She says since she's been using lemon Ginger herbal tea with probiotic she is having much better results. She generally has 3 bowel movements per week. Most of her stools are small and lately stools been Olive black in color. She is using water and gel for neck shoulder and lower back pain 3 times a day. She is also doing aqua therapy at [facility] twice a week.   Current Medications: Outpatient Encounter Prescriptions as of 08/23/2015  Medication Sig  . Acetaminophen (TYLENOL EXTRA STRENGTH PO) Take 500 mg by mouth as needed. Patient takes no more than 4 per day.  . ALPRAZolam (XANAX) 0.25 MG tablet Take 0.25 mg by mouth 2 (two) times daily.   Marland Kitchen ALPRAZolam (XANAX) 1 MG tablet Take by mouth at bedtime.   . Biotin 5000 MCG TABS Take 10,000 tablets by mouth at bedtime.   . Bisacodyl (CORRECTOL PO) Take 1 tablet by mouth as needed.  . calcium carbonate (OS-CAL) 600 MG TABS Take 600 mg by mouth 2 (two) times daily with a meal.  . Cholecalciferol (SM VITAMIN D3) 4000 UNITS CAPS Take 2,000 Units by mouth daily. 2000  . cyanocobalamin (,VITAMIN B-12,) 1000 MCG/ML injection Inject into the muscle. Twice a month  . diclofenac sodium (VOLTAREN) 1 % GEL Apply 1 g topically 3 (three) times daily.   . Ginger, Zingiber officinalis, (GINGER ROOT) 550 MG CAPS Take by mouth 2 (two) times daily.  . Iron-Vitamin C (VITRON-C PO) Take by mouth daily.  Marland Kitchen levothyroxine (SYNTHROID, LEVOTHROID) 50 MCG tablet Take 50 mcg by mouth daily.  . methocarbamol  (ROBAXIN) 500 MG tablet Take 500 mg by mouth 3 (three) times daily.  Marland Kitchen OVER THE COUNTER MEDICATION Curamin Extra Strength - Patient takes 3 po at lunch.  Vladimir Faster Glycol-Propyl Glycol (SYSTANE) 0.4-0.3 % GEL Apply 1 application to eye. ! Application in the morning and 1 application at bedtime.  . promethazine (PHENERGAN) 25 MG suppository Place 25 mg rectally as needed for nausea or vomiting.  . Promethazine HCl (PHENERGAN PO) Take 25 mg by mouth as needed.  . RABEprazole (ACIPHEX) 20 MG tablet TAKE 1 TABLET (20 MG TOTAL) BY MOUTH 2 (TWO) TIMES DAILY BEFORE A MEAL.  Marland Kitchen sucralfate (CARAFATE) 1 GM/10ML suspension Take 1 g by mouth 2 (two) times daily as needed.   . thiamine (VITAMIN B-1) 100 MG tablet Take 100 mg by mouth every evening.  . traZODone (DESYREL) 50 MG tablet Take 50 mg by mouth at bedtime.   . vitamin C (ASCORBIC ACID) 500 MG tablet Take 1,000 mg by mouth daily.   . [DISCONTINUED] butalbital-acetaminophen-caffeine (FIORICET WITH CODEINE) 50-325-40-30 MG per capsule Take 1 capsule by mouth every 4 (four) hours as needed for headache. Reported on 08/23/2015  . [DISCONTINUED] oxyCODONE-acetaminophen (PERCOCET/ROXICET) 5-325 MG per tablet Take by mouth. Reported on 08/23/2015   No facility-administered encounter medications on file as of 08/23/2015.     Objective: Blood pressure 112/80, pulse 68, temperature 98.1 F (36.7 C), temperature source Oral, resp. rate 18, height 5' 5.5" (  1.664 m), weight 127 lb 3.2 oz (57.698 kg). Patient is alert and in no acute distress. Conjunctiva is pink. Sclera is nonicteric Oropharyngeal mucosa is normal. No neck masses or thyromegaly noted. Cardiac exam with regular rhythm normal S1 and S2. No murmur or gallop noted. Lungs are clear to auscultation. Abdomen is symmetrical soft and nontender without organomegaly or masses. Rectal examination reveals Dr. stool in the vault but it is guaiac-negative. No LE edema or clubbing  noted.   Assessment:  #1. Chronic GERD. She is requiring double dose PPI for symptom control but need first Jodean Lima has gone down which is reassuring. #2. Chronic constipation. She seems to be doing well since she's been using herbal tea. She needs to make sure she has at least 3 stools per week. If she goes more than 2 days she can use Dulcolax suppository so that she would not end up with fecal impaction. #3. History of colonic polyps and family history of CRC. Last colonoscopy was in April 2014.   Plan:  Patient will continue Rebaprazole at 20 mg by mouth twice a day and use sucralfate on as-needed basis. Patient advised to use Dulcolax suppository on as-needed basis to make sure she has at least 3 bowel movements per week. Office visit in one year. She will be due for colonoscopy in April 2019.

## 2015-09-02 ENCOUNTER — Encounter (INDEPENDENT_AMBULATORY_CARE_PROVIDER_SITE_OTHER): Payer: Self-pay

## 2015-12-07 ENCOUNTER — Other Ambulatory Visit (INDEPENDENT_AMBULATORY_CARE_PROVIDER_SITE_OTHER): Payer: Self-pay | Admitting: Internal Medicine

## 2015-12-09 ENCOUNTER — Telehealth (INDEPENDENT_AMBULATORY_CARE_PROVIDER_SITE_OTHER): Payer: Self-pay | Admitting: Internal Medicine

## 2015-12-09 DIAGNOSIS — K219 Gastro-esophageal reflux disease without esophagitis: Secondary | ICD-10-CM

## 2015-12-09 MED ORDER — RABEPRAZOLE SODIUM 20 MG PO TBEC: 20.0000 mg | DELAYED_RELEASE_TABLET | Freq: Two times a day (BID) | ORAL | 3 refills | Status: DC

## 2015-12-09 NOTE — Telephone Encounter (Signed)
Rx ordered

## 2015-12-13 MED ORDER — RABEPRAZOLE SODIUM 20 MG PO TBEC: 20.0000 mg | DELAYED_RELEASE_TABLET | Freq: Two times a day (BID) | ORAL | 3 refills | Status: AC

## 2015-12-13 NOTE — Telephone Encounter (Signed)
error 

## 2016-04-19 ENCOUNTER — Ambulatory Visit: Payer: 59 | Admitting: Rheumatology

## 2016-05-02 ENCOUNTER — Encounter: Payer: Self-pay | Admitting: Rheumatology

## 2016-05-02 ENCOUNTER — Ambulatory Visit (INDEPENDENT_AMBULATORY_CARE_PROVIDER_SITE_OTHER): Payer: 59 | Admitting: Rheumatology

## 2016-05-02 VITALS — BP 110/70 | HR 78 | Resp 14 | Ht 65.5 in | Wt 128.0 lb

## 2016-05-02 DIAGNOSIS — M79642 Pain in left hand: Secondary | ICD-10-CM

## 2016-05-02 DIAGNOSIS — M62838 Other muscle spasm: Secondary | ICD-10-CM

## 2016-05-02 DIAGNOSIS — F5101 Primary insomnia: Secondary | ICD-10-CM

## 2016-05-02 DIAGNOSIS — M25572 Pain in left ankle and joints of left foot: Secondary | ICD-10-CM

## 2016-05-02 DIAGNOSIS — M79641 Pain in right hand: Secondary | ICD-10-CM

## 2016-05-02 DIAGNOSIS — M79672 Pain in left foot: Secondary | ICD-10-CM

## 2016-05-02 DIAGNOSIS — R5382 Chronic fatigue, unspecified: Secondary | ICD-10-CM

## 2016-05-02 DIAGNOSIS — M797 Fibromyalgia: Secondary | ICD-10-CM

## 2016-05-02 DIAGNOSIS — M79671 Pain in right foot: Secondary | ICD-10-CM

## 2016-05-02 DIAGNOSIS — M25571 Pain in right ankle and joints of right foot: Secondary | ICD-10-CM

## 2016-05-02 MED ORDER — DICLOFENAC SODIUM 1 % TD GEL: TRANSDERMAL | 3 refills | Status: DC

## 2016-05-02 MED ORDER — METHOCARBAMOL 500 MG PO TABS: 500.0000 mg | ORAL_TABLET | Freq: Three times a day (TID) | ORAL | 1 refills | Status: DC | PRN

## 2016-05-02 NOTE — Patient Instructions (Signed)
Hand Exercises Introduction Hand exercises can be helpful to almost anyone. These exercises can strengthen the hands, improve flexibility and movement, and increase blood flow to the hands. These results can make work and daily tasks easier. Hand exercises can be especially helpful for people who have joint pain from arthritis or have nerve damage from overuse (carpal tunnel syndrome). These exercises can also help people who have injured a hand. Most of these hand exercises are fairly gentle stretching routines. You can do them often throughout the day. Still, it is a good idea to ask your health care provider which exercises would be best for you. Warming your hands before exercise may help to reduce stiffness. You can do this with gentle massage or by placing your hands in warm water for 15 minutes. Also, make sure you pay attention to your level of hand pain as you begin an exercise routine. Exercises Knuckle Bend  Repeat this exercise 5-10 times with each hand. 1. Stand or sit with your arm, hand, and all five fingers pointed straight up. Make sure your wrist is straight. 2. Gently and slowly bend your fingers down and inward until the tips of your fingers are touching the tops of your palm. 3. Hold this position for a few seconds. 4. Extend your fingers out to their original position, all pointing straight up again. Finger Fan  Repeat this exercise 5-10 times with each hand. 1. Hold your arm and hand out in front of you. Keep your wrist straight. 2. Squeeze your hand into a fist. 3. Hold this position for a few seconds. 4. Fan out, or spread apart, your hand and fingers as much as possible, stretching every joint fully. Tabletop  Repeat this exercise 5-10 times with each hand. 1. Stand or sit with your arm, hand, and all five fingers pointed straight up. Make sure your wrist is straight. 2. Gently and slowly bend your fingers at the knuckles where they meet the hand until your hand is  making an upside-down L shape. Your fingers should form a tabletop. 3. Hold this position for a few seconds. 4. Extend your fingers out to their original position, all pointing straight up again. Making Os  Repeat this exercise 5-10 times with each hand. 1. Stand or sit with your arm, hand, and all five fingers pointed straight up. Make sure your wrist is straight. 2. Make an O shape by touching your pointer finger to your thumb. Hold for a few seconds. Then open your hand wide. 3. Repeat this motion with each finger on your hand. Table Spread  Repeat this exercise 5-10 times with each hand. 1. Place your hand on a table with your palm facing down. Make sure your wrist is straight. 2. Spread your fingers out as much as possible. Hold this position for a few seconds. 3. Slide your fingers back together again. Hold for a few seconds. Ball Grip  Repeat this exercise 10-15 times with each hand. 1. Hold a tennis ball or another soft ball in your hand. 2. While slowly increasing pressure, squeeze the ball as hard as possible. 3. Squeeze as hard as you can for 3-5 seconds. 4. Relax and repeat. Wrist Curls  Repeat this exercise 10-15 times with each hand. 1. Sit in a chair that has armrests. 2. Hold a light weight in your hand, such as a dumbbell that weighs 1-3 pounds (0.5-1.4 kg). Ask your health care provider what weight would be best for you. 3. Rest your hand just   over the end of the chair arm with your palm facing up. 4. Gently pivot your wrist up and down while holding the weight. Do not twist your wrist from side to side. Contact a health care provider if:  Your hand pain or discomfort gets much worse when you do an exercise.  Your hand pain or discomfort does not improve within 2 hours after you exercise. If you have any of these problems, stop doing these exercises right away. Do not do them again unless your health care provider says that you can. Get help right away if:  You  develop sudden, severe hand pain. If this happens, stop doing these exercises right away. Do not do them again unless your health care provider says that you can. This information is not intended to replace advice given to you by your health care provider. Make sure you discuss any questions you have with your health care provider. Document Released: 02/28/2015 Document Revised: 08/25/2015 Document Reviewed: 09/27/2014  2017 Elsevier  

## 2016-05-02 NOTE — Progress Notes (Signed)
Office Visit Note  Patient: Danielle Byrd             Date of Birth: 01/31/1956           MRN: [id number]             PCP: Allie Dimmer, MD Referring: Allie Dimmer, MD Visit Date: 05/02/2016 Occupation: @GUAROCC @    Subjective:  Follow-up Fibromyalgia  History of Present Illness: Danielle Byrd is a 61 y.o. female  Last seen 10/13/2015.  Patient's fibromyalgia discomfort is rated 9 on a scale of 0-10.  Her most of her pain is to the bilateral trapezius muscles.  She had the same pain at the last visit and the cortisone injections given by Dr. Estanislado Pandy were not effective. She is asking repeat injection today because she is having unbearable pain.  She's taking Robaxin 3 times a day with some relief.  She also uses Voltaren gel which she feels is very helpful for her and it has "change my life".  Otherwise she cannot tolerate NSAIDs well.     Activities of Daily Living:  Patient reports morning stiffness for 15 minute.   Patient Reports nocturnal pain.  Difficulty dressing/grooming: Reports Difficulty climbing stairs: Reports Difficulty getting out of chair: Reports Difficulty using hands for taps, buttons, cutlery, and/or writing: Reports   No Rheumatology ROS completed.   PMFS History:  Patient Active Problem List   Diagnosis Date Noted  . 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 Headache(784.0) 10/27/2013  . Leukopenia 07/08/2013  . Anemia 10/31/2011  . Iron deficiency 10/31/2011  . GERD (gastroesophageal reflux disease) 05/14/2011  . Abdominal pain, bilateral upper quadrant 05/14/2011  . H/O fibromyalgia 05/14/2011  . Constipation 05/14/2011  . Hyperlipemia 05/14/2011  . Kidney stones 01/11/2011    Past Medical History:  Diagnosis Date  . Anemia   . Anxiety   . Arthritis   . Cervical stenosis (uterine cervix)   . Constipation   . Copper deficiency   . Fibromyalgia   . GERD (gastroesophageal reflux disease)   . High cholesterol   .  Hypothyroidism   . Kidney stones     Family History  Problem Relation Age of Onset  . Diabetes Mother   . Hypertension Mother   . Anemia Mother   . Colon cancer Father   . Cancer Father 22    colon cancer   Past Surgical History:  Procedure Laterality Date  . ABDOMINAL HYSTERECTOMY    . BREAST SURGERY Right    partial mastectomy  . CHOLECYSTECTOMY    . COLONOSCOPY N/A 07/22/2012   Procedure: COLONOSCOPY;  Surgeon: [name], MD;  [facility];  Service: Endoscopy;  Laterality: N/A;  730  . DILATION AND CURETTAGE OF UTERUS    . EYE SURGERY    . NISSEN FUNDOPLICATION     Social History   Social History Narrative   Patient lives at home with chacha   Patient right handed   Patient has a BS degree.   Patient drinks 1 tea daily.     Objective: Vital Signs: BP 110/70   Pulse 78   Resp 14   Ht 5' 5.5" (1.664 m)   Wt 128 lb (58.1 kg)   BMI 20.98 kg/m    Physical Exam   Musculoskeletal Exam:  Full range of motion of all joints Grip strength is equal and strong bilaterally Fibromyalgia tender points are 18 out of 18 positive  CDAI Exam: No CDAI exam completed.  No synovitis  Investigation: No additional findings. No visits with results within 6 Month(s) from this visit.  Latest known visit with results is:  Infusion on 02/19/2013  Component Date Value Ref Range Status  . WBC 02/19/2013 3.1* 4.0 - 10.5 K/uL Final  . RBC 02/19/2013 3.50* 3.87 - 5.11 MIL/uL Final  . Hemoglobin 02/19/2013 11.9* 12.0 - 15.0 g/dL Final  . HCT 02/19/2013 35.0* 36.0 - 46.0 % Final  . MCV 02/19/2013 100.0  78.0 - 100.0 fL Final  . MCH 02/19/2013 34.0  26.0 - 34.0 pg Final  . MCHC 02/19/2013 34.0  30.0 - 36.0 g/dL Final  . RDW 02/19/2013 12.3  11.5 - 15.5 % Final  . Platelets 02/19/2013 177  150 - 400 K/uL Final  . Neutrophils Relative % 02/19/2013 35* 43 - 77 % Final  . Neutro Abs 02/19/2013 1.1* 1.7 - 7.7 K/uL Final  . Lymphocytes Relative 02/19/2013 56* 12 - 46 %  Final  . Lymphs Abs 02/19/2013 1.7  0.7 - 4.0 K/uL Final  . Monocytes Relative 02/19/2013 9  3 - 12 % Final  . Monocytes Absolute 02/19/2013 0.3  0.1 - 1.0 K/uL Final  . Eosinophils Relative 02/19/2013 1  0 - 5 % Final  . Eosinophils Absolute 02/19/2013 0.0  0.0 - 0.7 K/uL Final  . Basophils Relative 02/19/2013 0  0 - 1 % Final  . Basophils Absolute 02/19/2013 0.0  0.0 - 0.1 K/uL Final   Patient has no insurance. She lives in [place]. The affordable cataract from [place] does not allow her to pay for doctor visits in [place].  I asked her to get labs for [place] today but she states that it's not affordable for her.  Imaging: No results found.  Speciality Comments: No specialty comments available.    Procedures:  No procedures performed Allergies: Reglan [metoclopramide]; Vicodin [hydrocodone-acetaminophen]; Codeine; Hydrocodone-acetaminophen; Lyrica [pregabalin]; Penicillins; and Statins   Assessment / Plan:     Visit Diagnoses: Fibromyalgia  Chronic fatigue  Primary insomnia  Pain in joints of both feet  Bilateral hand pain  Trapezius muscle spasm   Plan: #1: Fibromyalgia. Patient does do water aerobics.  #2: Fatigue and insomnia. Ongoing.  #3: Bilateral trapezius muscle spasms. After informed consent was obtained, the site was prepped in usual fashion, injected with 1% lidocaine (0.5 mL's) next with 10 mg of Kenalog. Patient tolerated procedure well. No complications. We'll trapezius muscles were injected. 2 minutes after the injection, patient's pain went from a 9 down to a 7.  #3: Refill Voltaren gel 5 tubes with 2 refills. I gave her good Rx coupon. She is able to get 5 tubes were about $90 for [facility].  #4: Refill methocarbamol 500 mg 3 times a day.; Dispensed ordered and 70 pills with 1 refill. Good Rx coupon provided for the patient. Cost of the medication is about $37 with good Rx. I gave her the coupon for this as well  #5: Return to clinic in 6  months   Orders: No orders of the defined types were placed in this encounter.  Meds ordered this encounter  Medications  . diclofenac sodium (VOLTAREN) 1 % GEL    Sig: Apply 3 g to 3 large joints up to 3 times a day prn    Dispense:  5 Tube    Refill:  3    Order Specific Question:   Supervising Provider    Answer:   Bo Merino [[id number]]  . methocarbamol (ROBAXIN) 500 MG tablet    Sig:  Take 1 tablet (500 mg total) by mouth every 8 (eight) hours as needed for muscle spasms.    Dispense:  270 tablet    Refill:  1    Order Specific Question:   Supervising Provider    Answer:   Bo Merino [phone]    Face-to-face time spent with patient was 30 minutes. 50% of time was spent in counseling and coordination of care.  Follow-Up Instructions: Return in about 6 months (around 10/30/2016) for [place].   Eliezer Lofts, PA-C  Bo Merino, MD, [place] Payer Note - This record has been created using [employer].  Chart creation errors have been sought, but may not always  have been located. Such creation errors do not reflect on  the standard of medical care.

## 2016-06-21 ENCOUNTER — Encounter (INDEPENDENT_AMBULATORY_CARE_PROVIDER_SITE_OTHER): Payer: Self-pay | Admitting: Internal Medicine

## 2016-08-22 ENCOUNTER — Encounter (INDEPENDENT_AMBULATORY_CARE_PROVIDER_SITE_OTHER): Payer: Self-pay

## 2016-08-22 ENCOUNTER — Encounter (INDEPENDENT_AMBULATORY_CARE_PROVIDER_SITE_OTHER): Payer: Self-pay | Admitting: Internal Medicine

## 2016-08-22 ENCOUNTER — Encounter (INDEPENDENT_AMBULATORY_CARE_PROVIDER_SITE_OTHER): Payer: Self-pay | Admitting: *Deleted

## 2016-08-22 ENCOUNTER — Ambulatory Visit (INDEPENDENT_AMBULATORY_CARE_PROVIDER_SITE_OTHER): Payer: 59 | Admitting: Internal Medicine

## 2016-08-22 VITALS — BP 100/60 | HR 72 | Temp 97.8°F | Ht 65.6 in | Wt 128.8 lb

## 2016-08-22 DIAGNOSIS — K219 Gastro-esophageal reflux disease without esophagitis: Secondary | ICD-10-CM

## 2016-08-22 DIAGNOSIS — R1011 Right upper quadrant pain: Secondary | ICD-10-CM

## 2016-08-22 DIAGNOSIS — K5909 Other constipation: Secondary | ICD-10-CM | POA: Diagnosis not present

## 2016-08-22 LAB — HEPATIC FUNCTION PANEL
ALBUMIN: 4 g/dL (ref 3.6–5.1)
ALK PHOS: 63 U/L (ref 33–130)
ALT: 17 U/L (ref 6–29)
AST: 20 U/L (ref 10–35)
Bilirubin, Direct: 0.1 mg/dL (ref <=0.2)
Indirect Bilirubin: 0.3 mg/dL (ref 0.2–1.2)
Total Bilirubin: 0.4 mg/dL (ref 0.2–1.2)
Total Protein: 6.1 g/dL (ref 6.1–8.1)

## 2016-08-22 NOTE — Progress Notes (Signed)
Subjective:    Patient ID: KYRSTYN GREEAR, female    DOB: 09/19/1955, 61 y.o.   MRN: [id number]  HPI Here today for f/u. She was last seen in May of 2017. Hx of GERD. She tells me she is doing okay. She has had some mouth ulcers. She also c/o pain RUQ pain.  Hx of cholecystectomy 2007.  Pain started about a week ago. Rates pain 8/10.  She says sitting agitates the pain  She says she has the pain everyday. Her acid reflux is controlled with Aciphex.  Appetite is okay. No weight loss. She has a BM every other day with the supp.  No melena or BRRB.     Review of Systems Past Medical History:  Diagnosis Date  . Anemia   . Anxiety   . Arthritis   . Cervical stenosis (uterine cervix)   . Constipation   . Copper deficiency   . Fibromyalgia   . GERD (gastroesophageal reflux disease)   . High cholesterol   . Hypothyroidism   . Kidney stones     Past Surgical History:  Procedure Laterality Date  . ABDOMINAL HYSTERECTOMY    . BREAST SURGERY Right    partial mastectomy  . CHOLECYSTECTOMY    . COLONOSCOPY N/A 07/22/2012   Procedure: COLONOSCOPY;  Surgeon: [name], MD;  [facility];  Service: Endoscopy;  Laterality: N/A;  730  . DILATION AND CURETTAGE OF UTERUS    . EYE SURGERY    . NISSEN FUNDOPLICATION      Allergies  Allergen Reactions  . Reglan [Metoclopramide]     Per the patient it caused headaches that was getting worse, also had started to bother her vision.  . Vicodin [Hydrocodone-Acetaminophen] Diarrhea    Nausea and vomiting  . Codeine Itching  . Hydrocodone-Acetaminophen Diarrhea    Nausea and vomiting  . Lyrica [Pregabalin] Swelling    Broke out in Rash  . Penicillins Hives    Also experienced itching  . Statins     Current Outpatient Prescriptions on File Prior to Visit  Medication Sig Dispense Refill  . Acetaminophen (TYLENOL EXTRA STRENGTH PO) Take 500 mg by mouth as needed. Patient takes no more than 4 per day.    . ALPRAZolam  (XANAX) 0.25 MG tablet Take 0.25 mg by mouth 2 (two) times daily.     Marland Kitchen ALPRAZolam (XANAX) 1 MG tablet Take by mouth at bedtime.     . Biotin 5000 MCG TABS Take 10,000 tablets by mouth at bedtime.     . Bisacodyl (CORRECTOL PO) Take 1 tablet by mouth as needed.    . calcium carbonate (OS-CAL) 600 MG TABS Take 600 mg by mouth 2 (two) times daily with a meal.    . Cholecalciferol (SM VITAMIN D3) 4000 UNITS CAPS Take 2,000 Units by mouth daily. 2000    . Coenzyme Q10 100 MG capsule Take by mouth.    . cyanocobalamin (,VITAMIN B-12,) 1000 MCG/ML injection Inject into the muscle. Twice a month    . diclofenac sodium (VOLTAREN) 1 % GEL Apply 3 g to 3 large joints up to 3 times a day prn 5 Tube 3  . Ginger, Zingiber officinalis, (GINGER ROOT) 550 MG CAPS Take by mouth 2 (two) times daily.    . Iron-Vitamin C (VITRON-C PO) Take by mouth daily.    Marland Kitchen levothyroxine (SYNTHROID, LEVOTHROID) 50 MCG tablet Take 50 mcg by mouth daily.    . methocarbamol (ROBAXIN) 500 MG tablet Take  500 mg by mouth 3 (three) times daily.    Vladimir Faster Glycol-Propyl Glycol (SYSTANE) 0.4-0.3 % GEL Apply 1 application to eye. ! Application in the morning and 1 application at bedtime.    . RABEprazole (ACIPHEX) 20 MG tablet Take 1 tablet (20 mg total) by mouth 2 (two) times daily. 90 tablet 3  . sucralfate (CARAFATE) 1 GM/10ML suspension Take 1 g by mouth 2 (two) times daily as needed.     . thiamine (VITAMIN B-1) 100 MG tablet Take 100 mg by mouth every evening.    . traZODone (DESYREL) 50 MG tablet Take 50 mg by mouth at bedtime.     . vitamin C (ASCORBIC ACID) 500 MG tablet Take 1,000 mg by mouth daily.      No current facility-administered medications on file prior to visit.        Objective:   Physical Exam Blood pressure 100/60, pulse 72, temperature 97.8 F (36.6 C), height 5' 5.6" (1.666 m), weight 128 lb 12.8 oz (58.4 kg). Alert and oriented. Skin warm and dry. Oral mucosa is moist.   . Sclera anicteric, conjunctivae  is pink. Thyroid not enlarged. No cervical lymphadenopathy. Lungs clear. Heart regular rate and rhythm.  Abdomen is soft. Bowel sounds are positive. No hepatomegaly. No abdominal masses felt Tenderness rt upper quadrant .  No edema to lower extremities.         Assessment & Plan:  Chronic constipation: Dulcolax supp as needed. Colonoscopy in 2019 Chronic GERD. Continue  Aciphex 20mg  BID RUQ pain: Hepatic panel. US abdomen. CBD stone needs to be ruled out.

## 2016-08-22 NOTE — Patient Instructions (Addendum)
Continue the Aciphex Dulcolax supp as needed Hepatic function US abdomen.

## 2016-08-24 ENCOUNTER — Ambulatory Visit (HOSPITAL_COMMUNITY)
Admission: RE | Admit: 2016-08-24 | Discharge: 2016-08-24 | Disposition: A | Discharge status: Home / Self Care | Payer: Self-pay | Source: Ambulatory Visit | Attending: Internal Medicine | Admitting: Internal Medicine

## 2016-08-24 DIAGNOSIS — Z9049 Acquired absence of other specified parts of digestive tract: Secondary | ICD-10-CM | POA: Insufficient documentation

## 2016-08-24 DIAGNOSIS — K760 Fatty (change of) liver, not elsewhere classified: Secondary | ICD-10-CM | POA: Insufficient documentation

## 2016-08-24 DIAGNOSIS — R1011 Right upper quadrant pain: Secondary | ICD-10-CM | POA: Insufficient documentation

## 2016-08-28 ENCOUNTER — Telehealth (INDEPENDENT_AMBULATORY_CARE_PROVIDER_SITE_OTHER): Payer: Self-pay | Admitting: Internal Medicine

## 2016-08-28 NOTE — Telephone Encounter (Signed)
Patient called, she'd like her lab and ultrasound results.  [phone]

## 2016-08-29 NOTE — Telephone Encounter (Signed)
Results have been given to patient 

## 2016-10-31 ENCOUNTER — Ambulatory Visit: Payer: Self-pay | Admitting: Rheumatology

## 2016-11-22 DIAGNOSIS — Z862 Personal history of diseases of the blood and blood-forming organs and certain disorders involving the immune mechanism: Secondary | ICD-10-CM | POA: Insufficient documentation

## 2016-11-22 DIAGNOSIS — Z9049 Acquired absence of other specified parts of digestive tract: Secondary | ICD-10-CM | POA: Insufficient documentation

## 2016-11-22 DIAGNOSIS — M797 Fibromyalgia: Secondary | ICD-10-CM | POA: Insufficient documentation

## 2016-11-22 DIAGNOSIS — Z9289 Personal history of other medical treatment: Secondary | ICD-10-CM | POA: Insufficient documentation

## 2016-11-22 DIAGNOSIS — M19042 Primary osteoarthritis, left hand: Secondary | ICD-10-CM

## 2016-11-22 DIAGNOSIS — M3500 Sicca syndrome, unspecified: Secondary | ICD-10-CM | POA: Insufficient documentation

## 2016-11-22 DIAGNOSIS — Z87442 Personal history of urinary calculi: Secondary | ICD-10-CM | POA: Insufficient documentation

## 2016-11-22 DIAGNOSIS — M19041 Primary osteoarthritis, right hand: Secondary | ICD-10-CM | POA: Insufficient documentation

## 2016-11-22 DIAGNOSIS — M5030 Other cervical disc degeneration, unspecified cervical region: Secondary | ICD-10-CM | POA: Insufficient documentation

## 2016-11-22 DIAGNOSIS — Z8669 Personal history of other diseases of the nervous system and sense organs: Secondary | ICD-10-CM | POA: Insufficient documentation

## 2016-11-22 DIAGNOSIS — Z8659 Personal history of other mental and behavioral disorders: Secondary | ICD-10-CM | POA: Insufficient documentation

## 2016-11-22 DIAGNOSIS — R5383 Other fatigue: Secondary | ICD-10-CM | POA: Insufficient documentation

## 2016-11-22 DIAGNOSIS — Z8639 Personal history of other endocrine, nutritional and metabolic disease: Secondary | ICD-10-CM | POA: Insufficient documentation

## 2016-11-22 NOTE — Progress Notes (Signed)
Office Visit Note  Patient: Danielle Byrd             Date of Birth: [birth year]           MRN: [id number]             PCP: Allie Dimmer, MD Referring: Allie Dimmer, MD Visit Date: 11/27/2016 Occupation: @GUAROCC @    Subjective:  Neck pain.   History of Present Illness: Danielle Byrd is a 61 y.o. female with history of fibromyalgia, osteoarthritis and disc disease. According to her she's been having a lot of neck discomfort. She states the pain sometimes radiated into her right arm. She has difficulty driving at times. She's been having some discomfort in her bilateral knee joints. She denies any joint swelling.  Activities of Daily Living:  Patient reports morning stiffness for 1 hour.   Patient Reports nocturnal pain.  Difficulty dressing/grooming: Denies Difficulty climbing stairs: Denies Difficulty getting out of chair: Denies Difficulty using hands for taps, buttons, cutlery, and/or writing: Denies   Review of Systems  Constitutional: Positive for fatigue. Negative for night sweats, weight gain, weight loss and weakness.  HENT: Positive for mouth dryness. Negative for mouth sores, trouble swallowing, trouble swallowing and nose dryness.   Eyes: Positive for dryness. Negative for pain, redness and visual disturbance.  Respiratory: Negative for cough, shortness of breath and difficulty breathing.   Cardiovascular: Negative for chest pain, palpitations, hypertension, irregular heartbeat and swelling in legs/feet.  Gastrointestinal: Negative for blood in stool, constipation and diarrhea.  Endocrine: Negative for increased urination.  Genitourinary: Negative for vaginal dryness.  Musculoskeletal: Positive for arthralgias, joint pain, myalgias, morning stiffness and myalgias. Negative for joint swelling, muscle weakness and muscle tenderness.  Skin: Negative for color change, rash, hair loss, skin tightness, ulcers and sensitivity to sunlight.  Allergic/Immunologic:  Negative for susceptible to infections.  Neurological: Negative for dizziness, memory loss and night sweats.  Hematological: Negative for swollen glands.  Psychiatric/Behavioral: Positive for depressed mood and sleep disturbance. The patient is nervous/anxious.     PMFS History:  Patient Active Problem List   Diagnosis Date Noted  . Fibromyalgia 11/22/2016  . Other fatigue 11/22/2016  . DDD (degenerative disc disease), cervical 11/22/2016  . Primary osteoarthritis of both hands 11/22/2016  . History of migraine 11/22/2016  . History of anxiety 11/22/2016  . History of hypothyroidism 11/22/2016  . History of kidney stones 11/22/2016  . History of cholecystectomy 11/22/2016  . History of anemia 11/22/2016  . Hx of bone density study/ normal 2014 this is followed by her Primary Care  11/22/2016  . Sicca syndrome ([place]) 11/22/2016  . 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 Headache(784.0) 10/27/2013  . Leukopenia 07/08/2013  . Anemia 10/31/2011  . Iron deficiency 10/31/2011  . GERD (gastroesophageal reflux disease) 05/14/2011  . Abdominal pain, bilateral upper quadrant 05/14/2011  . H/O fibromyalgia 05/14/2011  . Constipation 05/14/2011  . Hyperlipemia 05/14/2011  . Kidney stones 01/11/2011    Past Medical History:  Diagnosis Date  . Anemia   . Anxiety   . Arthritis   . Cervical stenosis (uterine cervix)   . Constipation   . Copper deficiency   . Fibromyalgia   . GERD (gastroesophageal reflux disease)   . High cholesterol   . Hypothyroidism   . Kidney stones   . Lumbar stenosis 11/27/2016    Family History  Problem Relation Age of Onset  . Diabetes Mother   . Hypertension Mother   . Anemia Mother   .  Colon cancer Father   . Cancer Father 50       colon cancer  . Heart attack Father    Past Surgical History:  Procedure Laterality Date  . ABDOMINAL HYSTERECTOMY    . BREAST SURGERY Right    partial mastectomy  . CHOLECYSTECTOMY    . COLONOSCOPY N/A 07/22/2012    Procedure: COLONOSCOPY;  Surgeon: [name], MD;  [facility];  Service: Endoscopy;  Laterality: N/A;  730  . DILATION AND CURETTAGE OF UTERUS    . EYE SURGERY    . NISSEN FUNDOPLICATION     Social History   Social History Narrative   Patient lives at home with chacha   Patient right handed   Patient has a BS degree.   Patient drinks 1 tea daily.     Objective: Vital Signs: BP 122/70 (BP Location: Left Arm, Patient Position: Sitting, Cuff Size: Normal)   Pulse 65   Ht 5\' 5"  (1.651 m)   Wt 127 lb (57.6 kg)   BMI 21.13 kg/m    Physical Exam  Constitutional: She is oriented to person, place, and time. She appears well-developed and well-nourished.  HENT:  Head: Normocephalic and atraumatic.  Eyes: Conjunctivae and EOM are normal.  Neck: Normal range of motion.  Cardiovascular: Normal rate, regular rhythm, normal heart sounds and intact distal pulses.   Pulmonary/Chest: Effort normal and breath sounds normal.  Abdominal: Soft. Bowel sounds are normal.  Lymphadenopathy:    She has no cervical adenopathy.  Neurological: She is alert and oriented to person, place, and time.  Skin: Skin is warm and dry. Capillary refill takes less than 2 seconds.  Psychiatric: She has a normal mood and affect. Her behavior is normal.  Nursing note and vitals reviewed.    Musculoskeletal Exam: C-spine and thoracic lumbar spine discomfort with range of motion. She had bilateral trapezius is spasm. Shoulder joints elbow joints are good range of motion. She has some osteoarthritic changes in her hands without any synovitis. Hip joints knee joints ankles MTPs PIPs with good range of motion with no synovitis. Fibromyalgia tender points with 12 out of 18 positive.  CDAI Exam: No CDAI exam completed.    Investigation: No additional findings.   Imaging: No results found.  Speciality Comments: No specialty comments available.    Procedures:  Trigger Point Inj Date/Time:  11/27/2016 2:20 PM Performed by: Bo Merino Authorized by: Bo Merino   Consent Given by:  Patient Site marked: the procedure site was marked   Timeout: prior to procedure the correct patient, procedure, and site was verified   Indications:  Muscle spasm and pain Total # of Trigger Points:  2 Location: neck   Needle Size:  27 G Approach:  Dorsal Medications #1:  0.5 mL lidocaine 1 %; 10 mg triamcinolone acetonide 40 MG/ML Medications #2:  0.5 mL lidocaine 1 %; 10 mg triamcinolone acetonide 40 MG/ML Patient tolerance:  Patient tolerated the procedure well with no immediate complications   Allergies: Reglan [metoclopramide]; Vicodin [hydrocodone-acetaminophen]; Codeine; Hydrocodone-acetaminophen; Lyrica [pregabalin]; Penicillins; and Statins   Assessment / Plan:     Visit Diagnoses: Sicca syndrome ([place]): She's been using some over-the-counter products which is been useful.  Primary osteoarthritis of both hands: Joint protection and muscle strengthening discussed.  DDD (degenerative disc disease), cervical she continues to have brought the stiffness and discomfort in her neck. She had bilateral trapezius is spasm. After informed consent was obtained bilateral trapezius area was prepped distal fashion and  injected with cortisone and lidocaine as described above.  Lower back pain: Coarse strengthening exercises were demonstrated and discussed in the office today and a handout was given.  Fibromyalgia: She continues to have some generalized pain and stiffness.  Primary insomnia : Good sleep hygiene was discussed.  Other fatigue: Related to insomnia.  Her other medical problems are listed as follows:  History of migraine  Other neutropenia ([place])  History of anxiety  History of hypothyroidism  History of kidney stones  History of cholecystectomy  History of anemia  Hx of bone density study/ normal 2014 this is followed by her Primary Care     Orders: Orders  Placed This Encounter  Procedures  . Trigger Point Injection   No orders of the defined types were placed in this encounter.   Face-to-face time spent with patient was 30 minutes. Greater than 50% of time was spent in counseling and coordination of care.  Follow-Up Instructions: Return in about 6 months (around 05/30/2017) for sicca fms OA DDD.   Bo Merino, MD  Note - This record has been created using [employer].  Chart creation errors have been sought, but may not always  have been located. Such creation errors do not reflect on  the standard of medical care.

## 2016-11-27 ENCOUNTER — Encounter: Payer: Self-pay | Admitting: Rheumatology

## 2016-11-27 ENCOUNTER — Ambulatory Visit (INDEPENDENT_AMBULATORY_CARE_PROVIDER_SITE_OTHER): Payer: Medicare Other | Admitting: Rheumatology

## 2016-11-27 VITALS — BP 122/70 | HR 65 | Ht 65.0 in | Wt 127.0 lb

## 2016-11-27 DIAGNOSIS — Z862 Personal history of diseases of the blood and blood-forming organs and certain disorders involving the immune mechanism: Secondary | ICD-10-CM | POA: Diagnosis not present

## 2016-11-27 DIAGNOSIS — Z8669 Personal history of other diseases of the nervous system and sense organs: Secondary | ICD-10-CM | POA: Diagnosis not present

## 2016-11-27 DIAGNOSIS — R5383 Other fatigue: Secondary | ICD-10-CM

## 2016-11-27 DIAGNOSIS — Z9049 Acquired absence of other specified parts of digestive tract: Secondary | ICD-10-CM | POA: Diagnosis not present

## 2016-11-27 DIAGNOSIS — Z8639 Personal history of other endocrine, nutritional and metabolic disease: Secondary | ICD-10-CM | POA: Diagnosis not present

## 2016-11-27 DIAGNOSIS — M19041 Primary osteoarthritis, right hand: Secondary | ICD-10-CM | POA: Diagnosis not present

## 2016-11-27 DIAGNOSIS — D708 Other neutropenia: Secondary | ICD-10-CM | POA: Diagnosis not present

## 2016-11-27 DIAGNOSIS — M19042 Primary osteoarthritis, left hand: Secondary | ICD-10-CM

## 2016-11-27 DIAGNOSIS — Z87442 Personal history of urinary calculi: Secondary | ICD-10-CM

## 2016-11-27 DIAGNOSIS — M48061 Spinal stenosis, lumbar region without neurogenic claudication: Secondary | ICD-10-CM

## 2016-11-27 DIAGNOSIS — Z9289 Personal history of other medical treatment: Secondary | ICD-10-CM

## 2016-11-27 DIAGNOSIS — M3500 Sicca syndrome, unspecified: Secondary | ICD-10-CM

## 2016-11-27 DIAGNOSIS — F5101 Primary insomnia: Secondary | ICD-10-CM | POA: Diagnosis not present

## 2016-11-27 DIAGNOSIS — M5030 Other cervical disc degeneration, unspecified cervical region: Secondary | ICD-10-CM

## 2016-11-27 DIAGNOSIS — M797 Fibromyalgia: Secondary | ICD-10-CM | POA: Diagnosis not present

## 2016-11-27 DIAGNOSIS — Z8659 Personal history of other mental and behavioral disorders: Secondary | ICD-10-CM | POA: Diagnosis not present

## 2016-11-27 HISTORY — DX: Spinal stenosis, lumbar region without neurogenic claudication: M48.061

## 2016-11-27 MED ORDER — TRIAMCINOLONE ACETONIDE 40 MG/ML IJ SUSP
10.0000 mg | INTRAMUSCULAR | Status: AC | PRN
  Administered 2016-11-27: 10 mg via INTRAMUSCULAR

## 2016-11-27 MED ORDER — LIDOCAINE HCL 1 % IJ SOLN
0.5000 mL | INTRAMUSCULAR | Status: AC | PRN
  Administered 2016-11-27: .5 mL

## 2016-11-27 NOTE — Patient Instructions (Signed)
Cervical Strain and Sprain Rehab Ask your health care provider which exercises are safe for you. Do exercises exactly as told by your health care provider and adjust them as directed. It is normal to feel mild stretching, pulling, tightness, or discomfort as you do these exercises, but you should stop right away if you feel sudden pain or your pain gets worse.Do not begin these exercises until told by your health care provider. Stretching and range of motion exercises These exercises warm up your muscles and joints and improve the movement and flexibility of your neck. These exercises also help to relieve pain, numbness, and tingling. Exercise A: Cervical side bend  1. Using good posture, sit on a stable chair or stand up. 2. Without moving your shoulders, slowly tilt your left / right ear to your shoulder until you feel a stretch in your neck muscles. You should be looking straight ahead. 3. Hold for __________ seconds. 4. Repeat with the other side of your neck. Repeat __________ times. Complete this exercise __________ times a day. Exercise B: Cervical rotation  1. Using good posture, sit on a stable chair or stand up. 2. Slowly turn your head to the side as if you are looking over your left / right shoulder. ? Keep your eyes level with the ground. ? Stop when you feel a stretch along the side and the back of your neck. 3. Hold for __________ seconds. 4. Repeat this by turning to your other side. Repeat __________ times. Complete this exercise __________ times a day. Exercise C: Thoracic extension and pectoral stretch 1. Roll a towel or a small blanket so it is about 4 inches (10 cm) in diameter. 2. Lie down on your back on a firm surface. 3. Put the towel lengthwise, under your spine in the middle of your back. It should not be not under your shoulder blades. The towel should line up with your spine from your middle back to your lower back. 4. Put your hands behind your head and let your  elbows fall out to your sides. 5. Hold for __________ seconds. Repeat __________ times. Complete this exercise __________ times a day. Strengthening exercises These exercises build strength and endurance in your neck. Endurance is the ability to use your muscles for a long time, even after your muscles get tired. Exercise D: Upper cervical flexion, isometric 1. Lie on your back with a thin pillow behind your head and a small rolled-up towel under your neck. 2. Gently tuck your chin toward your chest and nod your head down to look toward your feet. Do not lift your head off the pillow. 3. Hold for __________ seconds. 4. Release the tension slowly. Relax your neck muscles completely before you repeat this exercise. Repeat __________ times. Complete this exercise __________ times a day. Exercise E: Cervical extension, isometric  1. Stand about 6 inches (15 cm) away from a wall, with your back facing the wall. 2. Place a soft object, about 6-8 inches (15-20 cm) in diameter, between the back of your head and the wall. A soft object could be a small pillow, a ball, or a folded towel. 3. Gently tilt your head back and press into the soft object. Keep your jaw and forehead relaxed. 4. Hold for __________ seconds. 5. Release the tension slowly. Relax your neck muscles completely before you repeat this exercise. Repeat __________ times. Complete this exercise __________ times a day. Posture and body mechanics  Body mechanics refers to the movements and positions of   your body while you do your daily activities. Posture is part of body mechanics. Good posture and healthy body mechanics can help to relieve stress in your body's tissues and joints. Good posture means that your spine is in its natural S-curve position (your spine is neutral), your shoulders are pulled back slightly, and your head is not tipped forward. The following are general guidelines for applying improved posture and body mechanics to  your everyday activities. Standing  When standing, keep your spine neutral and keep your feet about hip-width apart. Keep a slight bend in your knees. Your ears, shoulders, and hips should line up.  When you do a task in which you stand in one place for a long time, place one foot up on a stable object that is 2-4 inches (5-10 cm) high, such as a footstool. This helps keep your spine neutral. Sitting   When sitting, keep your spine neutral and your keep feet flat on the floor. Use a footrest, if necessary, and keep your thighs parallel to the floor. Avoid rounding your shoulders, and avoid tilting your head forward.  When working at a desk or a computer, keep your desk at a height where your hands are slightly lower than your elbows. Slide your chair under your desk so you are close enough to maintain good posture.  When working at a computer, place your monitor at a height where you are looking straight ahead and you do not have to tilt your head forward or downward to look at the screen. Resting When lying down and resting, avoid positions that are most painful for you. Try to support your neck in a neutral position. You can use a contour pillow or a small rolled-up towel. Your pillow should support your neck but not push on it. This information is not intended to replace advice given to you by your health care provider. Make sure you discuss any questions you have with your health care provider. Document Released: 03/19/2005 Document Revised: 11/24/2015 Document Reviewed: 02/23/2015 Elsevier Interactive Patient Education  2018 [employer] Back Exercises The following exercises strengthen the muscles that help to support the back. They also help to keep the lower back flexible. Doing these exercises can help to prevent back pain or lessen existing pain. If you have back pain or discomfort, try doing these exercises 2-3 times each day or as told by your health care provider. When the pain  goes away, do them once each day, but increase the number of times that you repeat the steps for each exercise (do more repetitions). If you do not have back pain or discomfort, do these exercises once each day or as told by your health care provider. Exercises Single Knee to Chest  Repeat these steps 3-5 times for each leg: 5. Lie on your back on a firm bed or the floor with your legs extended. 6. Bring one knee to your chest. Your other leg should stay extended and in contact with the floor. 7. Hold your knee in place by grabbing your knee or thigh. 8. Pull on your knee until you feel a gentle stretch in your lower back. 9. Hold the stretch for 10-30 seconds. 10. Slowly release and straighten your leg.  Pelvic Tilt  Repeat these steps 5-10 times: 1. Lie on your back on a firm bed or the floor with your legs extended. 2. Bend your knees so they are pointing toward the ceiling and your feet are flat on the floor. 3. Tighten   your lower abdominal muscles to press your lower back against the floor. This motion will tilt your pelvis so your tailbone points up toward the ceiling instead of pointing to your feet or the floor. 4. With gentle tension and even breathing, hold this position for 5-10 seconds.  Cat-Cow  Repeat these steps until your lower back becomes more flexible: 6. Get into a hands-and-knees position on a firm surface. Keep your hands under your shoulders, and keep your knees under your hips. You may place padding under your knees for comfort. 7. Let your head hang down, and point your tailbone toward the floor so your lower back becomes rounded like the back of a cat. 8. Hold this position for 5 seconds. 9. Slowly lift your head and point your tailbone up toward the ceiling so your back forms a sagging arch like the back of a cow. 10. Hold this position for 5 seconds.  Press-Ups  Repeat these steps 5-10 times: 5. Lie on your abdomen (face-down) on the floor. 6. Place your  palms near your head, about shoulder-width apart. 7. While you keep your back as relaxed as possible and keep your hips on the floor, slowly straighten your arms to raise the top half of your body and lift your shoulders. Do not use your back muscles to raise your upper torso. You may adjust the placement of your hands to make yourself more comfortable. 8. Hold this position for 5 seconds while you keep your back relaxed. 9. Slowly return to lying flat on the floor.  Bridges  Repeat these steps 10 times: 6. Lie on your back on a firm surface. 7. Bend your knees so they are pointing toward the ceiling and your feet are flat on the floor. 8. Tighten your buttocks muscles and lift your buttocks off of the floor until your waist is at almost the same height as your knees. You should feel the muscles working in your buttocks and the back of your thighs. If you do not feel these muscles, slide your feet 1-2 inches farther away from your buttocks. 9. Hold this position for 3-5 seconds. 10. Slowly lower your hips to the starting position, and allow your buttocks muscles to relax completely.  If this exercise is too easy, try doing it with your arms crossed over your chest. Abdominal Crunches  Repeat these steps 5-10 times: 1. Lie on your back on a firm bed or the floor with your legs extended. 2. Bend your knees so they are pointing toward the ceiling and your feet are flat on the floor. 3. Cross your arms over your chest. 4. Tip your chin slightly toward your chest without bending your neck. 5. Tighten your abdominal muscles and slowly raise your trunk (torso) high enough to lift your shoulder blades a tiny bit off of the floor. Avoid raising your torso higher than that, because it can put too much stress on your low back and it does not help to strengthen your abdominal muscles. 6. Slowly return to your starting position.  Back Lifts Repeat these steps 5-10 times: 1. Lie on your abdomen  (face-down) with your arms at your sides, and rest your forehead on the floor. 2. Tighten the muscles in your legs and your buttocks. 3. Slowly lift your chest off of the floor while you keep your hips pressed to the floor. Keep the back of your head in line with the curve in your back. Your eyes should be looking at the floor. 4.   Hold this position for 3-5 seconds. 5. Slowly return to your starting position.  Contact a health care provider if:  Your back pain or discomfort gets much worse when you do an exercise.  Your back pain or discomfort does not lessen within 2 hours after you exercise. If you have any of these problems, stop doing these exercises right away. Do not do them again unless your health care provider says that you can. Get help right away if:  You develop sudden, severe back pain. If this happens, stop doing the exercises right away. Do not do them again unless your health care provider says that you can. This information is not intended to replace advice given to you by your health care provider. Make sure you discuss any questions you have with your health care provider. Document Released: 04/26/2004 Document Revised: 07/27/2015 Document Reviewed: 05/13/2014 Elsevier Interactive Patient Education  2017 [employer]vier Inc.  

## 2016-12-13 DIAGNOSIS — Z5181 Encounter for therapeutic drug level monitoring: Secondary | ICD-10-CM | POA: Diagnosis not present

## 2017-01-20 ENCOUNTER — Encounter (HOSPITAL_COMMUNITY): Payer: Self-pay | Admitting: Emergency Medicine

## 2017-01-20 ENCOUNTER — Emergency Department (HOSPITAL_COMMUNITY): Payer: No Typology Code available for payment source

## 2017-01-20 ENCOUNTER — Emergency Department (HOSPITAL_COMMUNITY)
Admission: EM | Admit: 2017-01-20 | Discharge: 2017-01-20 | Disposition: A | Discharge status: Home / Self Care | Payer: No Typology Code available for payment source | Attending: Emergency Medicine | Admitting: Emergency Medicine

## 2017-01-20 DIAGNOSIS — M545 Low back pain: Secondary | ICD-10-CM | POA: Insufficient documentation

## 2017-01-20 DIAGNOSIS — E039 Hypothyroidism, unspecified: Secondary | ICD-10-CM | POA: Diagnosis not present

## 2017-01-20 DIAGNOSIS — H53149 Visual discomfort, unspecified: Secondary | ICD-10-CM | POA: Diagnosis not present

## 2017-01-20 DIAGNOSIS — Z79899 Other long term (current) drug therapy: Secondary | ICD-10-CM | POA: Insufficient documentation

## 2017-01-20 DIAGNOSIS — R51 Headache: Secondary | ICD-10-CM | POA: Diagnosis not present

## 2017-01-20 DIAGNOSIS — R519 Headache, unspecified: Secondary | ICD-10-CM

## 2017-01-20 MED ORDER — KETOROLAC TROMETHAMINE 30 MG/ML IJ SOLN
15.0000 mg | Freq: Once | INTRAMUSCULAR | Status: AC
  Administered 2017-01-20: 15 mg via INTRAVENOUS

## 2017-01-20 MED ORDER — PROCHLORPERAZINE EDISYLATE 5 MG/ML IJ SOLN
10.0000 mg | Freq: Once | INTRAMUSCULAR | Status: AC
  Administered 2017-01-20: 10 mg via INTRAVENOUS

## 2017-01-20 MED ORDER — TRAMADOL HCL 50 MG PO TABS
50.0000 mg | ORAL_TABLET | Freq: Once | ORAL | Status: AC
  Administered 2017-01-20: 50 mg via ORAL
  Filled 2017-01-20: qty 1

## 2017-01-20 MED ORDER — SODIUM CHLORIDE 0.9 % IV BOLUS (SEPSIS)
1000.0000 mL | Freq: Once | INTRAVENOUS | Status: AC
  Administered 2017-01-20: 1000 mL via INTRAVENOUS
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
## 2017-01-20 NOTE — Discharge Instructions (Signed)
As discussed, your evaluation today has been largely reassuring.  But, it is important that you monitor your condition carefully, and do not hesitate to return to the ED if you develop new, or concerning changes in your condition. ? ?Otherwise, please follow-up with your physician for appropriate ongoing care. ? ?

## 2017-01-21 DIAGNOSIS — H52223 Regular astigmatism, bilateral: Secondary | ICD-10-CM | POA: Diagnosis not present

## 2017-01-21 DIAGNOSIS — H524 Presbyopia: Secondary | ICD-10-CM | POA: Diagnosis not present

## 2017-01-21 DIAGNOSIS — H04123 Dry eye syndrome of bilateral lacrimal glands: Secondary | ICD-10-CM | POA: Diagnosis not present

## 2017-01-21 DIAGNOSIS — H43813 Vitreous degeneration, bilateral: Secondary | ICD-10-CM | POA: Diagnosis not present

## 2017-01-21 DIAGNOSIS — H11003 Unspecified pterygium of eye, bilateral: Secondary | ICD-10-CM | POA: Diagnosis not present

## 2017-01-21 DIAGNOSIS — H5203 Hypermetropia, bilateral: Secondary | ICD-10-CM | POA: Diagnosis not present

## 2017-01-21 DIAGNOSIS — H25093 Other age-related incipient cataract, bilateral: Secondary | ICD-10-CM | POA: Diagnosis not present

## 2017-01-24 ENCOUNTER — Telehealth: Payer: Self-pay

## 2017-01-24 MED ORDER — DICLOFENAC SODIUM 1 % TD GEL: TRANSDERMAL | 3 refills | Status: DC

## 2017-01-24 NOTE — Telephone Encounter (Signed)
Patient would like a Rx refill on Voltaren Gel sent to [facility] in [place], [place].  CB# is [phone].  Please advise.  Thank you.

## 2017-01-24 NOTE — Telephone Encounter (Signed)
Last Visit: 11/27/16 Next Visit: 05/30/17  Okay to refill per Dr. Estanislado Pandy

## 2017-01-24 NOTE — Addendum Note (Signed)
Addended by: Carole Binning on: 01/24/2017 04:40 PM   Modules accepted: Orders

## 2017-01-29 ENCOUNTER — Telehealth: Payer: Self-pay

## 2017-01-29 NOTE — Telephone Encounter (Signed)
Received a fax from [employer] regarding a prior authorization approval for Voltaren Gel through 04/01/2018.   Reference number: [id number] Phone number: [phone]  Will send document to scan center.  Called patient to update. Left message.  Dorean Daniello, [place], CPhT 3:11 PM

## 2017-01-29 NOTE — Telephone Encounter (Signed)
Patient returned call. She voices understanding and denied any questions at this time.   Danielle Byrd, [place], CPhT 3:21 PM

## 2017-01-29 NOTE — Telephone Encounter (Signed)
A prior authorization for Voltaren gel has been submitted to patient's insurance via cover my meds. Will update once we receive a response.   Gordon Vandunk, [place], CPhT 3:05 PM

## 2017-01-31 ENCOUNTER — Other Ambulatory Visit: Payer: Self-pay

## 2017-02-04 DIAGNOSIS — M519 Unspecified thoracic, thoracolumbar and lumbosacral intervertebral disc disorder: Secondary | ICD-10-CM | POA: Diagnosis not present

## 2017-02-04 DIAGNOSIS — M4722 Other spondylosis with radiculopathy, cervical region: Secondary | ICD-10-CM | POA: Diagnosis not present

## 2017-02-05 DIAGNOSIS — M4722 Other spondylosis with radiculopathy, cervical region: Secondary | ICD-10-CM | POA: Diagnosis not present

## 2017-02-05 DIAGNOSIS — M519 Unspecified thoracic, thoracolumbar and lumbosacral intervertebral disc disorder: Secondary | ICD-10-CM | POA: Diagnosis not present

## 2017-02-11 DIAGNOSIS — M519 Unspecified thoracic, thoracolumbar and lumbosacral intervertebral disc disorder: Secondary | ICD-10-CM | POA: Diagnosis not present

## 2017-02-11 DIAGNOSIS — M4722 Other spondylosis with radiculopathy, cervical region: Secondary | ICD-10-CM | POA: Diagnosis not present

## 2017-02-13 DIAGNOSIS — M519 Unspecified thoracic, thoracolumbar and lumbosacral intervertebral disc disorder: Secondary | ICD-10-CM | POA: Diagnosis not present

## 2017-02-13 DIAGNOSIS — M4722 Other spondylosis with radiculopathy, cervical region: Secondary | ICD-10-CM | POA: Diagnosis not present

## 2017-02-18 DIAGNOSIS — M4722 Other spondylosis with radiculopathy, cervical region: Secondary | ICD-10-CM | POA: Diagnosis not present

## 2017-02-18 DIAGNOSIS — M519 Unspecified thoracic, thoracolumbar and lumbosacral intervertebral disc disorder: Secondary | ICD-10-CM | POA: Diagnosis not present

## 2017-02-20 DIAGNOSIS — M4722 Other spondylosis with radiculopathy, cervical region: Secondary | ICD-10-CM | POA: Diagnosis not present

## 2017-02-20 DIAGNOSIS — M519 Unspecified thoracic, thoracolumbar and lumbosacral intervertebral disc disorder: Secondary | ICD-10-CM | POA: Diagnosis not present

## 2017-02-25 DIAGNOSIS — M4722 Other spondylosis with radiculopathy, cervical region: Secondary | ICD-10-CM | POA: Diagnosis not present

## 2017-02-25 DIAGNOSIS — M519 Unspecified thoracic, thoracolumbar and lumbosacral intervertebral disc disorder: Secondary | ICD-10-CM | POA: Diagnosis not present

## 2017-02-27 DIAGNOSIS — M519 Unspecified thoracic, thoracolumbar and lumbosacral intervertebral disc disorder: Secondary | ICD-10-CM | POA: Diagnosis not present

## 2017-02-27 DIAGNOSIS — M4722 Other spondylosis with radiculopathy, cervical region: Secondary | ICD-10-CM | POA: Diagnosis not present

## 2017-03-01 ENCOUNTER — Ambulatory Visit (INDEPENDENT_AMBULATORY_CARE_PROVIDER_SITE_OTHER): Payer: Medicare Other | Admitting: Neurology

## 2017-03-01 ENCOUNTER — Other Ambulatory Visit: Payer: Self-pay | Admitting: *Deleted

## 2017-03-01 ENCOUNTER — Encounter: Payer: Self-pay | Admitting: Neurology

## 2017-03-01 VITALS — BP 121/76 | HR 78 | Ht 65.0 in | Wt 126.2 lb

## 2017-03-01 DIAGNOSIS — H5462 Unqualified visual loss, left eye, normal vision right eye: Secondary | ICD-10-CM

## 2017-03-01 DIAGNOSIS — H5712 Ocular pain, left eye: Secondary | ICD-10-CM | POA: Diagnosis not present

## 2017-03-01 DIAGNOSIS — G43009 Migraine without aura, not intractable, without status migrainosus: Secondary | ICD-10-CM

## 2017-03-01 DIAGNOSIS — H05112 Granuloma of left orbit: Secondary | ICD-10-CM

## 2017-03-01 DIAGNOSIS — R519 Headache, unspecified: Secondary | ICD-10-CM

## 2017-03-01 DIAGNOSIS — H538 Other visual disturbances: Secondary | ICD-10-CM | POA: Diagnosis not present

## 2017-03-01 DIAGNOSIS — R51 Headache with orthostatic component, not elsewhere classified: Secondary | ICD-10-CM

## 2017-03-01 NOTE — Progress Notes (Signed)
Error

## 2017-03-01 NOTE — Patient Instructions (Addendum)
MRI brain w/wo contrast and MRI orbits w/wo contrast Lab  To prevent or relieve headaches, try the following: Cool Compress. Lie down and place a cool compress on your head.  Avoid headache triggers. If certain foods or odors seem to have triggered your migraines in the past, avoid them. A headache diary might help you identify triggers.  Include physical activity in your daily routine. Try a daily walk or other moderate aerobic exercise.  Manage stress. Find healthy ways to cope with the stressors, such as delegating tasks on your to-do list.  Practice relaxation techniques. Try deep breathing, yoga, massage and visualization.  Eat regularly. Eating regularly scheduled meals and maintaining a healthy diet might help prevent headaches. Also, drink plenty of fluids.  Follow a regular sleep schedule. Sleep deprivation might contribute to headaches Consider biofeedback. With this mind-body technique, you learn to control certain bodily functions - such as muscle tension, heart rate and blood pressure - to prevent headaches or reduce headache pain.    Proceed to emergency room if you experience new or worsening symptoms or symptoms do not resolve, if you have new neurologic symptoms or if headache is severe, or for any concerning symptom.
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
## 2017-03-02 LAB — BASIC METABOLIC PANEL
BUN/Creatinine Ratio: 14 (ref 12–28)
BUN: 12 mg/dL (ref 8–27)
CALCIUM: 9.3 mg/dL (ref 8.7–10.3)
CHLORIDE: 101 mmol/L (ref 96–106)
CO2: 24 mmol/L (ref 20–29)
CREATININE: 0.88 mg/dL (ref 0.57–1.00)
GFR calc Af Amer: 81 mL/min/{1.73_m2} (ref >59)
GFR calc non Af Amer: 71 mL/min/{1.73_m2} (ref >59)
GLUCOSE: 89 mg/dL (ref 65–99)
Potassium: 4.4 mmol/L (ref 3.5–5.2)
Sodium: 140 mmol/L (ref 134–144)

## 2017-03-04 ENCOUNTER — Telehealth: Payer: Self-pay | Admitting: *Deleted

## 2017-03-04 DIAGNOSIS — D649 Anemia, unspecified: Secondary | ICD-10-CM

## 2017-03-04 DIAGNOSIS — M4722 Other spondylosis with radiculopathy, cervical region: Secondary | ICD-10-CM | POA: Diagnosis not present

## 2017-03-04 DIAGNOSIS — G43009 Migraine without aura, not intractable, without status migrainosus: Secondary | ICD-10-CM | POA: Insufficient documentation

## 2017-03-04 DIAGNOSIS — M519 Unspecified thoracic, thoracolumbar and lumbosacral intervertebral disc disorder: Secondary | ICD-10-CM | POA: Diagnosis not present

## 2017-03-04 NOTE — Telephone Encounter (Signed)
-----   Message from Melvenia Beam, MD sent at 03/03/2017  7:23 PM EST ----- Labs normal

## 2017-03-04 NOTE — Telephone Encounter (Signed)
Called patient. She verbalized understanding of lab results and verbalized appreciation for the call. She states she is expecting a call regarding MRI scheduling. I told her if she does not hear back in a couple of days to call the office- she verbalized understanding.

## 2017-03-06 DIAGNOSIS — M4722 Other spondylosis with radiculopathy, cervical region: Secondary | ICD-10-CM | POA: Diagnosis not present

## 2017-03-06 DIAGNOSIS — M519 Unspecified thoracic, thoracolumbar and lumbosacral intervertebral disc disorder: Secondary | ICD-10-CM | POA: Diagnosis not present

## 2017-03-08 DIAGNOSIS — E559 Vitamin D deficiency, unspecified: Secondary | ICD-10-CM | POA: Diagnosis not present

## 2017-03-08 DIAGNOSIS — D510 Vitamin B12 deficiency anemia due to intrinsic factor deficiency: Secondary | ICD-10-CM | POA: Diagnosis not present

## 2017-03-08 DIAGNOSIS — E039 Hypothyroidism, unspecified: Secondary | ICD-10-CM | POA: Diagnosis not present

## 2017-03-08 DIAGNOSIS — E782 Mixed hyperlipidemia: Secondary | ICD-10-CM | POA: Diagnosis not present

## 2017-03-08 DIAGNOSIS — D500 Iron deficiency anemia secondary to blood loss (chronic): Secondary | ICD-10-CM | POA: Diagnosis not present

## 2017-03-12 DIAGNOSIS — E039 Hypothyroidism, unspecified: Secondary | ICD-10-CM | POA: Diagnosis not present

## 2017-03-12 DIAGNOSIS — M4722 Other spondylosis with radiculopathy, cervical region: Secondary | ICD-10-CM | POA: Diagnosis not present

## 2017-03-12 DIAGNOSIS — E559 Vitamin D deficiency, unspecified: Secondary | ICD-10-CM | POA: Diagnosis not present

## 2017-03-12 DIAGNOSIS — E782 Mixed hyperlipidemia: Secondary | ICD-10-CM | POA: Diagnosis not present

## 2017-03-12 DIAGNOSIS — J029 Acute pharyngitis, unspecified: Secondary | ICD-10-CM | POA: Diagnosis not present

## 2017-03-14 DIAGNOSIS — M519 Unspecified thoracic, thoracolumbar and lumbosacral intervertebral disc disorder: Secondary | ICD-10-CM | POA: Diagnosis not present

## 2017-03-14 DIAGNOSIS — M4722 Other spondylosis with radiculopathy, cervical region: Secondary | ICD-10-CM | POA: Diagnosis not present

## 2017-03-18 ENCOUNTER — Ambulatory Visit
Admission: RE | Admit: 2017-03-18 | Discharge: 2017-03-18 | Disposition: A | Discharge status: Home / Self Care | Payer: Medicare Other | Source: Ambulatory Visit | Attending: Neurology | Admitting: Neurology

## 2017-03-18 DIAGNOSIS — H5462 Unqualified visual loss, left eye, normal vision right eye: Secondary | ICD-10-CM

## 2017-03-18 DIAGNOSIS — H5712 Ocular pain, left eye: Secondary | ICD-10-CM

## 2017-03-18 DIAGNOSIS — R51 Headache with orthostatic component, not elsewhere classified: Secondary | ICD-10-CM

## 2017-03-18 DIAGNOSIS — H538 Other visual disturbances: Secondary | ICD-10-CM

## 2017-03-18 DIAGNOSIS — H05112 Granuloma of left orbit: Secondary | ICD-10-CM

## 2017-03-18 DIAGNOSIS — R519 Headache, unspecified: Secondary | ICD-10-CM

## 2017-03-19 ENCOUNTER — Telehealth: Payer: Self-pay | Admitting: *Deleted

## 2017-03-19 NOTE — Telephone Encounter (Signed)
-----   Message from Melvenia Beam, MD sent at 03/19/2017  8:01 AM EST ----- MRI brain normal for age thanks

## 2017-03-19 NOTE — Telephone Encounter (Signed)
Called and LVM asking for call back. When she calls back, it is ok to tell her that her MRI of her orbits are normal and her MRI of her brain is normal for her age.

## 2017-03-19 NOTE — Telephone Encounter (Signed)
Patient called back and I informed her that the MRI orbits and Brain were normal for her age. She understood.

## 2017-03-20 DIAGNOSIS — M4722 Other spondylosis with radiculopathy, cervical region: Secondary | ICD-10-CM | POA: Diagnosis not present

## 2017-03-20 DIAGNOSIS — M519 Unspecified thoracic, thoracolumbar and lumbosacral intervertebral disc disorder: Secondary | ICD-10-CM | POA: Diagnosis not present

## 2017-03-21 DIAGNOSIS — M519 Unspecified thoracic, thoracolumbar and lumbosacral intervertebral disc disorder: Secondary | ICD-10-CM | POA: Diagnosis not present

## 2017-03-21 DIAGNOSIS — M4722 Other spondylosis with radiculopathy, cervical region: Secondary | ICD-10-CM | POA: Diagnosis not present

## 2017-03-28 DIAGNOSIS — M519 Unspecified thoracic, thoracolumbar and lumbosacral intervertebral disc disorder: Secondary | ICD-10-CM | POA: Diagnosis not present

## 2017-03-28 DIAGNOSIS — M4722 Other spondylosis with radiculopathy, cervical region: Secondary | ICD-10-CM | POA: Diagnosis not present

## 2017-05-16 NOTE — Progress Notes (Deleted)
Office Visit Note  Patient: Danielle Byrd             Date of Birth: [birth year]           MRN: [id number]             PCP: Allie Dimmer, MD Referring: Allie Dimmer, MD Visit Date: 05/30/2017 Occupation: '@GUAROCC' @    Subjective:  No chief complaint on file.   History of Present Illness: Danielle Byrd is a 62 y.o. female ***   Activities of Daily Living:  Patient reports morning stiffness for *** {minute/hour:19697}.   Patient {ACTIONS;DENIES/REPORTS:21021675::"Denies"} nocturnal pain.  Difficulty dressing/grooming: {ACTIONS;DENIES/REPORTS:21021675::"Denies"} Difficulty climbing stairs: {ACTIONS;DENIES/REPORTS:21021675::"Denies"} Difficulty getting out of chair: {ACTIONS;DENIES/REPORTS:21021675::"Denies"} Difficulty using hands for taps, buttons, cutlery, and/or writing: {ACTIONS;DENIES/REPORTS:21021675::"Denies"}   No Rheumatology ROS completed.   PMFS History:  Patient Active Problem List   Diagnosis Date Noted  . Migraine without aura and without status migrainosus, not intractable 03/04/2017  . Fibromyalgia 11/22/2016  . Other fatigue 11/22/2016  . DDD (degenerative disc disease), cervical 11/22/2016  . Primary osteoarthritis of both hands 11/22/2016  . History of migraine 11/22/2016  . History of anxiety 11/22/2016  . History of hypothyroidism 11/22/2016  . History of kidney stones 11/22/2016  . History of cholecystectomy 11/22/2016  . History of anemia 11/22/2016  . Hx of bone density study/ normal 2014 this is followed by her Primary Care  11/22/2016  . Sicca syndrome ([place]) 11/22/2016  . 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 Headache(784.0) 10/27/2013  . Leukopenia 07/08/2013  . Anemia 10/31/2011  . Iron deficiency 10/31/2011  . GERD (gastroesophageal reflux disease) 05/14/2011  . Abdominal pain, bilateral upper quadrant 05/14/2011  . H/O fibromyalgia 05/14/2011  . Constipation 05/14/2011  . Hyperlipemia 05/14/2011  . Kidney stones 01/11/2011    Past Medical History:  Diagnosis Date  . Anemia   . Anxiety   . Arthritis   . Cervical stenosis (uterine cervix)   . Closed fracture of left foot   . Constipation   . Copper deficiency   . Depression   . Fibromyalgia   . GERD (gastroesophageal reflux disease)   . High cholesterol   . Hyperlipidemia   . Hypothyroidism   . Kidney stones   . Lumbar stenosis 11/27/2016  . Melanosis coli   . Migraines   . Nephrolithiasis   . Panic attack   . Rosacea   . Sliding hiatal hernia   . Spondylosis, cervical     Family History  Problem Relation Age of Onset  . Diabetes Mother   . Hypertension Mother   . Anemia Mother   . Colon cancer Father   . Cancer Father 24       colon cancer  . Heart attack Father    Past Surgical History:  Procedure Laterality Date  . ABDOMINAL HYSTERECTOMY    . BONE MARROW BIOPSY     & aspirate  . BREAST SURGERY Right    partial mastectomy  . CHOLECYSTECTOMY    . COLONOSCOPY N/A 07/22/2012   Procedure: COLONOSCOPY;  Surgeon: [name], MD;  Location: [facility];  Service: Endoscopy;  Laterality: N/A;  730  . DILATION AND CURETTAGE OF UTERUS    . EYE SURGERY    . NISSEN FUNDOPLICATION     Social History   Social History Narrative   Patient lives at home with [place] (boxer, therapy dog)   Patient right handed   Patient has a BS degree.   Patient drinks 2 cups of  tea daily.     Objective: Vital Signs: There were no vitals taken for this visit.   Physical Exam   Musculoskeletal Exam: ***  CDAI Exam: No CDAI exam completed.    Investigation: No additional findings.   Imaging: No results found.  Speciality Comments: No specialty comments available.    Procedures:  No procedures performed Allergies: Reglan [metoclopramide]; Vicodin [hydrocodone-acetaminophen]; Codeine; Cymbalta [duloxetine hcl]; Gabapentin; Hydrocodone-acetaminophen; Lyrica [pregabalin]; Penicillins; Savella [milnacipran hcl]; Statins; and Voltaren  [diclofenac sodium]   Assessment / Plan:     Visit Diagnoses: No diagnosis found.    Orders: No orders of the defined types were placed in this encounter.  No orders of the defined types were placed in this encounter.   Face-to-face time spent with patient was *** minutes. 50% of time was spent in counseling and coordination of care.  Follow-Up Instructions: No Follow-up on file.   Earnestine Mealing, CMA  Note - This record has been created using Editor, commissioning.  Chart creation errors have been sought, but may not always  have been located. Such creation errors do not reflect on  the standard of medical care.

## 2017-05-29 NOTE — Progress Notes (Signed)
Office Visit Note  Patient: Danielle Byrd             Date of Birth: 03-01-1956           MRN: [id number]             PCP: Allie Dimmer, MD Referring: Allie Dimmer, MD Visit Date: 06/12/2017 Occupation: '@GUAROCC' @    Subjective:  Generalized pain   History of Present Illness: Danielle Byrd is a 62 y.o. female with history of fibromyalgia, osteoarthritis, sicca syndrome, and DDD.  Patient reports she has been having increased generalized pain.  She states that she is having significant muscle tenderness and muscle tension in the trapezius region bilaterally.  She states she uses a heating pad, massage therapy, and ultrasonic gel on a regular basis.  Patient states that Voltaren gel has not been effective anymore.  She would like to try a different medication.  Patient states she has been having ankle swelling.  She states she continues to have significant pain from her previous coccyx fracture.  She states she tries to sit on a cushion.  Patient states she has been trying to exercise on a regular basis.  She has been going to the pool 3 times a week and has been taking on dancing classes.  She continues to have interrupted sleep due to her next trazodone at bedtime.  She continues to have fatigue related to her insomnia.  Activities of Daily Living:  Patient reports morning stiffness for1 hour.   Patient Reports nocturnal pain.  Difficulty dressing/grooming: Denies Difficulty climbing stairs: Reports Difficulty getting out of chair: Reports Difficulty using hands for taps, buttons, cutlery, and/or writing: Reports   Review of Systems  Constitutional: Positive for fatigue. Negative for weakness.  HENT: Positive for mouth dryness (She uses Biotene products). Negative for mouth sores, trouble swallowing, trouble swallowing and nose dryness.   Eyes: Positive for dryness (She uses eye drops). Negative for pain, redness and visual disturbance.  Respiratory: Negative for cough,  hemoptysis, shortness of breath and difficulty breathing.   Cardiovascular: Negative for chest pain, palpitations, hypertension and swelling in legs/feet.  Gastrointestinal: Positive for constipation. Negative for blood in stool and diarrhea.  Endocrine: Negative for increased urination.  Genitourinary: Negative for painful urination.  Musculoskeletal: Positive for arthralgias, joint pain, joint swelling, myalgias, morning stiffness, muscle tenderness and myalgias. Negative for muscle weakness.  Skin: Negative for color change, rash, hair loss, nodules/bumps, redness, skin tightness, ulcers and sensitivity to sunlight.  Allergic/Immunologic: Negative for susceptible to infections.  Neurological: Positive for headaches (hx of migraines). Negative for dizziness and numbness.  Hematological: Negative for swollen glands.  Psychiatric/Behavioral: Positive for depressed mood and sleep disturbance. The patient is nervous/anxious.     PMFS History:  Patient Active Problem List   Diagnosis Date Noted  . Migraine without aura and without status migrainosus, not intractable 03/04/2017  . Fibromyalgia 11/22/2016  . Other fatigue 11/22/2016  . DDD (degenerative disc disease), cervical 11/22/2016  . Primary osteoarthritis of both hands 11/22/2016  . History of migraine 11/22/2016  . History of anxiety 11/22/2016  . History of hypothyroidism 11/22/2016  . History of kidney stones 11/22/2016  . History of cholecystectomy 11/22/2016  . History of anemia 11/22/2016  . Hx of bone density study/ normal 2014 this is followed by her Primary Care  11/22/2016  . Sicca syndrome ([place]) 11/22/2016  . 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 Headache(784.0) 10/27/2013  . Leukopenia 07/08/2013  . Anemia 10/31/2011  . Iron  deficiency 10/31/2011  . GERD (gastroesophageal reflux disease) 05/14/2011  . Abdominal pain, bilateral upper quadrant 05/14/2011  . H/O fibromyalgia 05/14/2011  . Constipation 05/14/2011  .  Hyperlipemia 05/14/2011  . Kidney stones 01/11/2011    Past Medical History:  Diagnosis Date  . Anemia   . Anxiety   . Arthritis   . Cervical stenosis (uterine cervix)   . Closed fracture of left foot   . Constipation   . Copper deficiency   . Depression   . Fibromyalgia   . GERD (gastroesophageal reflux disease)   . High cholesterol   . Hyperlipidemia   . Hypothyroidism   . Kidney stones   . Lumbar stenosis 11/27/2016  . Melanosis coli   . Migraines   . Nephrolithiasis   . Panic attack   . Rosacea   . Sliding hiatal hernia   . Spondylosis, cervical     Family History  Problem Relation Age of Onset  . Diabetes Mother   . Hypertension Mother   . Anemia Mother   . Colon cancer Father   . Cancer Father 37       colon cancer  . Heart attack Father    Past Surgical History:  Procedure Laterality Date  . ABDOMINAL HYSTERECTOMY    . BONE MARROW BIOPSY     & aspirate  . BREAST SURGERY Right    partial mastectomy  . CHOLECYSTECTOMY    . COLONOSCOPY N/A 07/22/2012   Procedure: COLONOSCOPY;  Surgeon: [name], MD;  Location: [facility];  Service: Endoscopy;  Laterality: N/A;  730  . DILATION AND CURETTAGE OF UTERUS    . EYE SURGERY    . NISSEN FUNDOPLICATION     Social History   Social History Narrative   Patient lives at home with chacha (boxer, therapy dog)   Patient right handed   Patient has a BS degree.   Patient drinks 2 cups of tea daily.     Objective: Vital Signs: BP 116/63 (BP Location: Left Arm, Patient Position: Sitting, Cuff Size: Normal)   Pulse 67   Resp 16   Ht 5' 5.5" (1.664 m)   Wt 124 lb (56.2 kg)   BMI 20.32 kg/m    Physical Exam  Constitutional: She is oriented to person, place, and time. She appears well-developed and well-nourished.  HENT:  Head: Normocephalic and atraumatic.  Eyes: Conjunctivae and EOM are normal.  Neck: Normal range of motion.  Cardiovascular: Normal rate, regular rhythm, normal heart sounds and  intact distal pulses.  Pulmonary/Chest: Effort normal and breath sounds normal.  Abdominal: Soft. Bowel sounds are normal.  Lymphadenopathy:    She has no cervical adenopathy.  Neurological: She is alert and oriented to person, place, and time.  Skin: Skin is warm and dry. Capillary refill takes less than 2 seconds.  Psychiatric: She has a normal mood and affect. Her behavior is normal.  Nursing note and vitals reviewed.    Musculoskeletal Exam: C-spine limited range of motion.  Thoracic and lumbar spine good range of motion.  No midline spinal tenderness.  She has mild SI joint tenderness.  Shoulder joints, elbow joints, wrist joints, MCPs, PIPs, DIPs good range of motion with no synovitis.  Hip joints, knee joints, ankle joints, MTPs, PIPs, DIPs good range of motion with no synovitis.  No warmth or effusion of bilateral knees.  She has bilateral knee crepitus.  She has mild tenderness of bilateral trochanteric bursa.  She has trapezius muscle tension and muscle  tenderness.  CDAI Exam: No CDAI exam completed.    Investigation: No additional findings.  CBC Latest Ref Rng & Units 02/19/2013 08/21/2012 05/20/2012  WBC 4.0 - 10.5 K/uL 3.1(L) 3.4(L) 3.2(L)  Hemoglobin 12.0 - 15.0 g/dL 11.9(L) 11.3(L) 12.0  Hematocrit 36.0 - 46.0 % 35.0(L) 32.5(L) 34.1(L)  Platelets 150 - 400 K/uL 177 152 179   CMP Latest Ref Rng & Units 03/01/2017 08/22/2016 10/17/2011  Glucose 65 - 99 mg/dL 89 - 93  BUN 8 - 27 mg/dL 12 - 6  Creatinine 0.57 - 1.00 mg/dL 0.88 - 0.68  Sodium 134 - 144 mmol/L 140 - 140  Potassium 3.5 - 5.2 mmol/L 4.4 - 3.8  Chloride 96 - 106 mmol/L 101 - 102  CO2 20 - 29 mmol/L 24 - 30  Calcium 8.7 - 10.3 mg/dL 9.3 - 9.3  Total Protein 6.1 - 8.1 g/dL - 6.1 6.6  Total Bilirubin 0.2 - 1.2 mg/dL - 0.4 0.4  Alkaline Phos 33 - 130 U/L - 63 87  AST 10 - 35 U/L - 20 22  ALT 6 - 29 U/L - 17 12    Imaging: No results found.  Speciality Comments: No specialty comments  available.    Procedures:  Trigger Point Inj Date/Time: 06/12/2017 4:12 PM Performed by: Ofilia Neas, PA-C Authorized by: Ofilia Neas, PA-C   Consent Given by:  Patient Site marked: the procedure site was marked   Timeout: prior to procedure the correct patient, procedure, and site was verified   Total # of Trigger Points:  2 Location: neck   Needle Size:  27 G Approach:  Dorsal (Trigger point injection of bilateral trapezius) Medications #1:  0.5 mL lidocaine 1 %; 10 mg triamcinolone acetonide 40 MG/ML Medications #2:  0.5 mL lidocaine 1 %; 10 mg triamcinolone acetonide 40 MG/ML Patient tolerance:  Patient tolerated the procedure well with no immediate complications   Allergies: Reglan [metoclopramide]; Vicodin [hydrocodone-acetaminophen]; Codeine; Cymbalta [duloxetine hcl]; Gabapentin; Hydrocodone-acetaminophen; Lyrica [pregabalin]; Penicillins; Savella [milnacipran hcl]; Statins; and Voltaren [diclofenac sodium]   Assessment / Plan:     Visit Diagnoses: Fibromyalgia: She has generalized hyperalgesia on exam.  She has trapezius muscle tension and muscle tenderness.  She requested bilateral cortisone trigger point injections.  She tolerated the procedure well.  He has been having increased insomnia and fatigue.  She takes trazodone at bedtime for sleep.  She uses a heating pad, massage therapy, and ultrasonic gel on a regular basis.  She also has been exercising on a regular basis.  She states she goes to the pool 3 times a week and has been taking line dancing classes.  She was encouraged to continue to exercise on a regular basis.    Primary osteoarthritis of both hands -She takes natural anti-inflammatories.  [place] caused upset stomach.  She states that Voltaren gel has not been effective.  She has been using it for many years.  She would like to try something else.  We are going to apply for pen said.  A prescription for pen said was sent to the pharmacy.    DDD  (degenerative disc disease), cervical: She has some stiffness in her C-spine.  She has trapezius muscle tension and muscle tenderness.  She has no midline spinal tenderness in this region.  Sicca syndrome, unspecified ([place]): She continues to have sicca symptoms.  She uses Biotene products on a regular basis.  She also uses Systane eyedrops.  Trapezius muscle spasm -she has trapezius muscle tension and  muscle tenderness.  She requested bilateral trigger point injections today. She tolerated the procedure well. She was encouraged to use a heating pad and massage therapy.  Plan: Trigger Point Inj   Primary insomnia: She takes trazodone at bedtime to help her sleep.  Other fatigue: Chronic and related to insomnia.  Other medical conditions are listed as follows:   History of migraine  Other neutropenia ([place])  History of anxiety  History of hypothyroidism  History of kidney stones  History of cholecystectomy  History of anemia     Orders: Orders Placed This Encounter  Procedures  . Trigger Point Inj   No orders of the defined types were placed in this encounter.   Face-to-face time spent with patient was 30 minutes. >50% of time was spent in counseling and coordination of care.  Follow-Up Instructions: Return in about 6 months (around 12/13/2017) for Fibromyalgia, Osteoarthritis.   Ofilia Neas, PA-C   I examined and evaluated the patient with [name] PA. The plan of care was discussed as noted above.  Bo Merino, MD  Note - This record has been created using Editor, commissioning.  Chart creation errors have been sought, but may not always  have been located. Such creation errors do not reflect on  the standard of medical care.

## 2017-05-30 ENCOUNTER — Ambulatory Visit: Payer: Medicare Other | Admitting: Rheumatology

## 2017-06-04 DIAGNOSIS — H02889 Meibomian gland dysfunction of unspecified eye, unspecified eyelid: Secondary | ICD-10-CM | POA: Diagnosis not present

## 2017-06-04 DIAGNOSIS — H04123 Dry eye syndrome of bilateral lacrimal glands: Secondary | ICD-10-CM | POA: Diagnosis not present

## 2017-06-12 ENCOUNTER — Telehealth: Payer: Self-pay | Admitting: Physician Assistant

## 2017-06-12 ENCOUNTER — Encounter: Payer: Self-pay | Admitting: Physician Assistant

## 2017-06-12 ENCOUNTER — Ambulatory Visit (INDEPENDENT_AMBULATORY_CARE_PROVIDER_SITE_OTHER): Payer: Medicare Other | Admitting: Physician Assistant

## 2017-06-12 VITALS — BP 116/63 | HR 67 | Resp 16 | Ht 65.5 in | Wt 124.0 lb

## 2017-06-12 DIAGNOSIS — M797 Fibromyalgia: Secondary | ICD-10-CM

## 2017-06-12 DIAGNOSIS — M3500 Sicca syndrome, unspecified: Secondary | ICD-10-CM

## 2017-06-12 DIAGNOSIS — Z8659 Personal history of other mental and behavioral disorders: Secondary | ICD-10-CM | POA: Diagnosis not present

## 2017-06-12 DIAGNOSIS — F5101 Primary insomnia: Secondary | ICD-10-CM | POA: Diagnosis not present

## 2017-06-12 DIAGNOSIS — Z8639 Personal history of other endocrine, nutritional and metabolic disease: Secondary | ICD-10-CM

## 2017-06-12 DIAGNOSIS — Z9049 Acquired absence of other specified parts of digestive tract: Secondary | ICD-10-CM | POA: Diagnosis not present

## 2017-06-12 DIAGNOSIS — D708 Other neutropenia: Secondary | ICD-10-CM

## 2017-06-12 DIAGNOSIS — Z87442 Personal history of urinary calculi: Secondary | ICD-10-CM | POA: Diagnosis not present

## 2017-06-12 DIAGNOSIS — M62838 Other muscle spasm: Secondary | ICD-10-CM

## 2017-06-12 DIAGNOSIS — M19042 Primary osteoarthritis, left hand: Secondary | ICD-10-CM

## 2017-06-12 DIAGNOSIS — M5030 Other cervical disc degeneration, unspecified cervical region: Secondary | ICD-10-CM

## 2017-06-12 DIAGNOSIS — R5383 Other fatigue: Secondary | ICD-10-CM

## 2017-06-12 DIAGNOSIS — M19041 Primary osteoarthritis, right hand: Secondary | ICD-10-CM

## 2017-06-12 DIAGNOSIS — Z8669 Personal history of other diseases of the nervous system and sense organs: Secondary | ICD-10-CM | POA: Diagnosis not present

## 2017-06-12 DIAGNOSIS — Z862 Personal history of diseases of the blood and blood-forming organs and certain disorders involving the immune mechanism: Secondary | ICD-10-CM

## 2017-06-12 MED ORDER — TRIAMCINOLONE ACETONIDE 40 MG/ML IJ SUSP
10.0000 mg | INTRAMUSCULAR | Status: AC | PRN
  Administered 2017-06-12: 10 mg via INTRAMUSCULAR

## 2017-06-12 MED ORDER — DICLOFENAC SODIUM 2 % TD SOLN: TRANSDERMAL | 3 refills | Status: DC

## 2017-06-12 MED ORDER — LIDOCAINE HCL 1 % IJ SOLN
0.5000 mL | INTRAMUSCULAR | Status: AC | PRN
  Administered 2017-06-12: .5 mL

## 2017-06-12 MED ORDER — LIDOCAINE HCL 1 % IJ SOLN
0.5000 mL | INTRAMUSCULAR | Status: AC | PRN
Start: 2017-06-12 — End: 2017-06-12
  Administered 2017-06-12: .5 mL

## 2017-06-13 DIAGNOSIS — Z5181 Encounter for therapeutic drug level monitoring: Secondary | ICD-10-CM | POA: Diagnosis not present

## 2017-06-13 NOTE — Telephone Encounter (Signed)
Authorization submitted to pts insurance via cover my meds. Will update once we have a response.   Adriana Lina, [place], CPhT 8:49 AM

## 2017-06-14 NOTE — Telephone Encounter (Signed)
Patient called with concerns about the co-pay amount for Pennsaid with her insurance. She picked up Pennsaid along with her other medications yesterday. The pharmacy staff told her that there insurance approved Pennsaid for one fill the charge was $900+ She did not pick up the medication. I told her that we would look into the authorization once we had a response and go from there. If the medication is too expensive we could look at other options.  Patient voices understanding and denies any questions at this time.   Undine Nealis, [place], CPhT 2:02 PM

## 2017-06-17 NOTE — Telephone Encounter (Addendum)
Called pts insurance [facility]) to check the status of pts prior authorization. Spoke with [facility] who states that the pt has been denied because they did not receive an ICD-10 code on the authorization from Cover My Meds. Verified pts authorization in cover my meds that showed the ICD-10 code to be M19.041 (Primary Osteoarthritis, Right hand). We did not received a notification. Loreli Slot states that since the authorization has already been denied, an appeal would have to be processed for authorization. She will fax the denial letter and appeal information to the clinic.   I asked her about pts co-pay information she received a the pharmacy. ($900+). Loreli Slot states that the pt is still in the 1st benefit stage with her plans where she will have to pay 100% of the cost of the meds then she would have to pay 25% going forward.   Called pt to update. She can not afford this co-pay and would like to apply for patient manufacture assistance through [employer]. She plans to come by on Wednesday, March 20 th to fill out application and provide her social security benefit letter. Once all documents have been received, we will fax the application.   [employer]. Spoke with Bonner Puna, representative. She states that there is a 24 hours turnaround time.   Kaley Jutras, [place], CPhT  3:33 PM

## 2017-06-19 ENCOUNTER — Telehealth: Payer: Self-pay

## 2017-06-19 NOTE — Telephone Encounter (Signed)
Patient came by clinic to complete and sign application for Pennsaid patient assistance through [employer]. She provided her social security benefit letter to fax along with application. Application completed. Provider portion completed and signed. Application has been faxed to foundation. Will update once we receive a response.

## 2017-06-20 NOTE — Telephone Encounter (Signed)
Called pt to update. She will try Mobisyl OTC.   Emaley Applin, [place], CPhT 4:07 PM

## 2017-06-20 NOTE — Telephone Encounter (Signed)
Received a call from [employer] ([place] Patient Assistance). Spoke with Estill Bamberg who states that the pt has been denied assistance because Pennsaid is only approved for Osteoarthritis of the knees. She has documented in the profile OA of hands. If pt has OA of knees, we can reprocess the application for assistance.   Called pt to see where she uses the gel. She will be a new start to pennsaid. She was using Voltaren gel with no relief. She uses it on her neck, shoulders, hands and knees at times. Does the patient has a diagnoses of OA of the knees? Can we re-process the application for pennsaid patient assistance for OA of knees?   Thanks!  Altair Appenzeller, [place], CPhT 11:08 AM

## 2017-06-20 NOTE — Telephone Encounter (Signed)
No she does not have osteoarthritis in her knee joints based on the records.  His Voltaren gel and option?  If not then patient may try Mobisyl over-the-counter.

## 2017-07-10 ENCOUNTER — Encounter (INDEPENDENT_AMBULATORY_CARE_PROVIDER_SITE_OTHER): Payer: Self-pay | Admitting: *Deleted

## 2017-08-22 ENCOUNTER — Ambulatory Visit (INDEPENDENT_AMBULATORY_CARE_PROVIDER_SITE_OTHER): Payer: Medicare Other | Admitting: Internal Medicine

## 2017-09-04 ENCOUNTER — Other Ambulatory Visit (INDEPENDENT_AMBULATORY_CARE_PROVIDER_SITE_OTHER): Payer: Self-pay | Admitting: *Deleted

## 2017-09-04 DIAGNOSIS — Z86010 Personal history of colonic polyps: Secondary | ICD-10-CM

## 2017-09-04 DIAGNOSIS — Z800 Family history of malignant neoplasm of digestive organs: Secondary | ICD-10-CM

## 2017-09-05 ENCOUNTER — Encounter (INDEPENDENT_AMBULATORY_CARE_PROVIDER_SITE_OTHER): Payer: Self-pay | Admitting: *Deleted

## 2017-09-05 ENCOUNTER — Telehealth (INDEPENDENT_AMBULATORY_CARE_PROVIDER_SITE_OTHER): Payer: Self-pay | Admitting: *Deleted

## 2017-09-05 DIAGNOSIS — Z800 Family history of malignant neoplasm of digestive organs: Secondary | ICD-10-CM | POA: Insufficient documentation

## 2017-09-05 DIAGNOSIS — Z860100 Personal history of colon polyps, unspecified: Secondary | ICD-10-CM | POA: Insufficient documentation

## 2017-09-05 NOTE — Telephone Encounter (Signed)
Patient needs trilyte 

## 2017-09-09 MED ORDER — PEG 3350-KCL-NA BICARB-NACL 420 G PO SOLR: 4000.0000 mL | Freq: Once | ORAL | 0 refills | Status: AC

## 2017-09-24 DIAGNOSIS — E039 Hypothyroidism, unspecified: Secondary | ICD-10-CM | POA: Diagnosis not present

## 2017-09-24 DIAGNOSIS — D500 Iron deficiency anemia secondary to blood loss (chronic): Secondary | ICD-10-CM | POA: Diagnosis not present

## 2017-09-24 DIAGNOSIS — D510 Vitamin B12 deficiency anemia due to intrinsic factor deficiency: Secondary | ICD-10-CM | POA: Diagnosis not present

## 2017-09-24 DIAGNOSIS — E782 Mixed hyperlipidemia: Secondary | ICD-10-CM | POA: Diagnosis not present

## 2017-09-24 DIAGNOSIS — E559 Vitamin D deficiency, unspecified: Secondary | ICD-10-CM | POA: Diagnosis not present

## 2017-09-26 ENCOUNTER — Telehealth (INDEPENDENT_AMBULATORY_CARE_PROVIDER_SITE_OTHER): Payer: Self-pay | Admitting: *Deleted

## 2017-09-26 NOTE — Telephone Encounter (Signed)
Referring MD/PCP: buchanan   Procedure: tcs with propofol  Reason/Indication:  Hx polyps, fam hx colon ca  Has patient had this procedure before?  Yes, 2014  If so, when, by whom and where?    Is there a family history of colon cancer?  Yes, father, uncle, grandmother  Who?  What age when diagnosed?    Is patient diabetic?   no      Does patient have prosthetic heart valve or mechanical valve?  no  Do you have a pacemaker?  no  Has patient ever had endocarditis? no  Has patient had joint replacement within last 12 months?  no  Is patient constipated or do they take laxatives? yes  Does patient have a history of alcohol/drug use?  no  Is patient on blood thinner such as Coumadin, Plavix and/or Aspirin? no  Medications: doxycycline 20 mg daily, levothyroxine 50 mg daily, xanax .25 mg bid, methcarbomol 500 mg tid, aciphex 20 mg daily, ginger root 550 mg daily, CoQ 10, calcium, vit c, vit d3, tumeric, tylenol, voltaren, trazadone 20 mg daily, biotin, vit b12  Allergies: see epic  Medication Adjustment per Dr Lindi Adie, NP:   Procedure date & time: 10/25/17 at 24

## 2017-09-26 NOTE — Telephone Encounter (Signed)
agree

## 2017-09-27 DIAGNOSIS — E782 Mixed hyperlipidemia: Secondary | ICD-10-CM | POA: Diagnosis not present

## 2017-09-27 DIAGNOSIS — D500 Iron deficiency anemia secondary to blood loss (chronic): Secondary | ICD-10-CM | POA: Diagnosis not present

## 2017-09-27 DIAGNOSIS — E559 Vitamin D deficiency, unspecified: Secondary | ICD-10-CM | POA: Diagnosis not present

## 2017-09-27 DIAGNOSIS — M25561 Pain in right knee: Secondary | ICD-10-CM | POA: Diagnosis not present

## 2017-09-27 DIAGNOSIS — E039 Hypothyroidism, unspecified: Secondary | ICD-10-CM | POA: Diagnosis not present

## 2017-09-27 DIAGNOSIS — D510 Vitamin B12 deficiency anemia due to intrinsic factor deficiency: Secondary | ICD-10-CM | POA: Diagnosis not present

## 2017-10-18 NOTE — Patient Instructions (Signed)
Danielle Byrd  10/18/2017     @PREFPERIOPPHARMACY @   Your procedure is scheduled on   10/25/2017  Report to [facility] at  900  A.M.  Call this number if you have problems the morning of surgery:  [phone]   Remember:  Do not eat or drink after midnight.  You may drink clear liquids until (follow the diet and prep instructions given to you by Dr Olevia Perches office) .  Clear liquids allowed are:                    Water, Juice (non-citric and without pulp), Carbonated beverages, Clear Tea, Black Coffee only, Plain Jell-O only, Gatorade and Plain Popsicles only    Take these medicines the morning of surgery with A SIP OF WATER  Xanax, levothyroxine, robaxin, aciphex.    Do not wear jewelry, make-up or nail polish.  Do not wear lotions, powders, or perfumes, or deodorant.  Do not shave 48 hours prior to surgery.  Men may shave face and neck.  Do not bring valuables to the hospital.  [facility] is not responsible for any belongings or valuables.  Contacts, dentures or bridgework may not be worn into surgery.  Leave your suitcase in the car.  After surgery it may be brought to your room.  For patients admitted to the hospital, discharge time will be determined by your treatment team.  Patients discharged the day of surgery will not be allowed to drive home.   Name and phone number of your driver:   family Special instructions:  Follow the diet and prep instructions given to you by Dr Olevia Perches office.  Please read over the following fact sheets that you were given. Anesthesia Post-op Instructions and Care and Recovery After Surgery       Colonoscopy, Adult A colonoscopy is an exam to look at the large intestine. It is done to check for problems, such as:  Lumps (tumors).  Growths (polyps).  Swelling (inflammation).  Bleeding.  What happens before the procedure? Eating and drinking Follow instructions from your doctor about eating and drinking. These  instructions may include:  A few days before the procedure - follow a low-fiber diet. ? Avoid nuts. ? Avoid seeds. ? Avoid dried fruit. ? Avoid raw fruits. ? Avoid vegetables.  1-3 days before the procedure - follow a clear liquid diet. Avoid liquids that have red or purple dye. Drink only clear liquids, such as: ? Clear broth or bouillon. ? Black coffee or tea. ? Clear juice. ? Clear soft drinks or sports drinks. ? Gelatin dessert. ? Popsicles.  On the day of the procedure - do not eat or drink anything during the 2 hours before the procedure.  Bowel prep If you were prescribed an oral bowel prep:  Take it as told by your doctor. Starting the day before your procedure, you will need to drink a lot of liquid. The liquid will cause you to poop (have bowel movements) until your poop is almost clear or light green.  If your skin or butt gets irritated from diarrhea, you may: ? Wipe the area with wipes that have medicine in them, such as adult wet wipes with aloe and vitamin E. ? Put something on your skin that soothes the area, such as petroleum jelly.  If you throw up (vomit) while drinking the bowel prep, take a break for up to 60 minutes. Then begin the bowel  prep again. If you keep throwing up and you cannot take the bowel prep without throwing up, call your doctor.  General instructions  Ask your doctor about changing or stopping your normal medicines. This is important if you take diabetes medicines or blood thinners.  Plan to have someone take you home from the hospital or clinic. What happens during the procedure?  An IV tube may be put into one of your veins.  You will be given medicine to help you relax (sedative).  To reduce your risk of infection: ? Your doctors will wash their hands. ? Your anal area will be washed with soap.  You will be asked to lie on your side with your knees bent.  Your doctor will get a long, thin, flexible tube ready. The tube will have  a camera and a light on the end.  The tube will be put into your anus.  The tube will be gently put into your large intestine.  Air will be delivered into your large intestine to keep it open. You may feel some pressure or cramping.  The camera will be used to take photos.  A small tissue sample may be removed from your body to be looked at under a microscope (biopsy). If any possible problems are found, the tissue will be sent to a lab for testing.  If small growths are found, your doctor may remove them and have them checked for cancer.  The tube that was put into your anus will be slowly removed. The procedure may vary among doctors and hospitals. What happens after the procedure?  Your doctor will check on you often until the medicines you were given have worn off.  Do not drive for 24 hours after the procedure.  You may have a small amount of blood in your poop.  You may pass gas.  You may have mild cramps or bloating in your belly (abdomen).  It is up to you to get the results of your procedure. Ask your doctor, or the department performing the procedure, when your results will be ready. This information is not intended to replace advice given to you by your health care provider. Make sure you discuss any questions you have with your health care provider. Document Released: 04/21/2010 Document Revised: 01/18/2016 Document Reviewed: 05/31/2015 Elsevier Interactive Patient Education  2017 [employer]  Colonoscopy, Adult, Care After This sheet gives you information about how to care for yourself after your procedure. Your health care provider may also give you more specific instructions. If you have problems or questions, contact your health care provider. What can I expect after the procedure? After the procedure, it is common to have:  A small amount of blood in your stool for 24 hours after the procedure.  Some gas.  Mild abdominal cramping or bloating.  Follow  these instructions at home: General instructions   For the first 24 hours after the procedure: ? Do not drive or use machinery. ? Do not sign important documents. ? Do not drink alcohol. ? Do your regular daily activities at a slower pace than normal. ? Eat soft, easy-to-digest foods. ? Rest often.  Take over-the-counter or prescription medicines only as told by your health care provider.  It is up to you to get the results of your procedure. Ask your health care provider, or the department performing the procedure, when your results will be ready. Relieving cramping and bloating  Try walking around when you have cramps or feel bloated.  Apply heat to your abdomen as told by your health care provider. Use a heat source that your health care provider recommends, such as a moist heat pack or a heating pad. ? Place a towel between your skin and the heat source. ? Leave the heat on for 20-30 minutes. ? Remove the heat if your skin turns bright red. This is especially important if you are unable to feel pain, heat, or cold. You may have a greater risk of getting burned. Eating and drinking  Drink enough fluid to keep your urine clear or pale yellow.  Resume your normal diet as instructed by your health care provider. Avoid heavy or fried foods that are hard to digest.  Avoid drinking alcohol for as long as instructed by your health care provider. Contact a health care provider if:  You have blood in your stool 2-3 days after the procedure. Get help right away if:  You have more than a small spotting of blood in your stool.  You pass large blood clots in your stool.  Your abdomen is swollen.  You have nausea or vomiting.  You have a fever.  You have increasing abdominal pain that is not relieved with medicine. This information is not intended to replace advice given to you by your health care provider. Make sure you discuss any questions you have with your health care  provider. Document Released: 11/01/2003 Document Revised: 12/12/2015 Document Reviewed: 05/31/2015 Elsevier Interactive Patient Education  2018 [place] Anesthesia is a term that refers to techniques, procedures, and medicines that help a person stay safe and comfortable during a medical procedure. Monitored anesthesia care, or sedation, is one type of anesthesia. Your anesthesia specialist may recommend sedation if you will be having a procedure that does not require you to be unconscious, such as:  Cataract surgery.  A dental procedure.  A biopsy.  A colonoscopy.  During the procedure, you may receive a medicine to help you relax (sedative). There are three levels of sedation:  Mild sedation. At this level, you may feel awake and relaxed. You will be able to follow directions.  Moderate sedation. At this level, you will be sleepy. You may not remember the procedure.  Deep sedation. At this level, you will be asleep. You will not remember the procedure.  The more medicine you are given, the deeper your level of sedation will be. Depending on how you respond to the procedure, the anesthesia specialist may change your level of sedation or the type of anesthesia to fit your needs. An anesthesia specialist will monitor you closely during the procedure. Let your health care provider know about:  Any allergies you have.  All medicines you are taking, including vitamins, herbs, eye drops, creams, and over-the-counter medicines.  Any use of steroids (by mouth or as a cream).  Any problems you or family members have had with sedatives and anesthetic medicines.  Any blood disorders you have.  Any surgeries you have had.  Any medical conditions you have, such as sleep apnea.  Whether you are pregnant or may be pregnant.  Any use of cigarettes, alcohol, or street drugs. What are the risks? Generally, this is a safe procedure. However, problems may  occur, including:  Getting too much medicine (oversedation).  Nausea.  Allergic reaction to medicines.  Trouble breathing. If this happens, a breathing tube may be used to help with breathing. It will be removed when you are awake and breathing on your own.  Heart trouble.  Lung trouble.  Before the procedure Staying hydrated Follow instructions from your health care provider about hydration, which may include:  Up to 2 hours before the procedure - you may continue to drink clear liquids, such as water, clear fruit juice, black coffee, and plain tea.  Eating and drinking restrictions Follow instructions from your health care provider about eating and drinking, which may include:  8 hours before the procedure - stop eating heavy meals or foods such as meat, fried foods, or fatty foods.  6 hours before the procedure - stop eating light meals or foods, such as toast or cereal.  6 hours before the procedure - stop drinking milk or drinks that contain milk.  2 hours before the procedure - stop drinking clear liquids.  Medicines Ask your health care provider about:  Changing or stopping your regular medicines. This is especially important if you are taking diabetes medicines or blood thinners.  Taking medicines such as aspirin and ibuprofen. These medicines can thin your blood. Do not take these medicines before your procedure if your health care provider instructs you not to.  Tests and exams  You will have a physical exam.  You may have blood tests done to show: ? How well your kidneys and liver are working. ? How well your blood can clot.  General instructions  Plan to have someone take you home from the hospital or clinic.  If you will be going home right after the procedure, plan to have someone with you for 24 hours.  What happens during the procedure?  Your blood pressure, heart rate, breathing, level of pain and overall condition will be monitored.  An IV  tube will be inserted into one of your veins.  Your anesthesia specialist will give you medicines as needed to keep you comfortable during the procedure. This may mean changing the level of sedation.  The procedure will be performed. After the procedure  Your blood pressure, heart rate, breathing rate, and blood oxygen level will be monitored until the medicines you were given have worn off.  Do not drive for 24 hours if you received a sedative.  You may: ? Feel sleepy, clumsy, or nauseous. ? Feel forgetful about what happened after the procedure. ? Have a sore throat if you had a breathing tube during the procedure. ? Vomit. This information is not intended to replace advice given to you by your health care provider. Make sure you discuss any questions you have with your health care provider. Document Released: 12/13/2004 Document Revised: 08/26/2015 Document Reviewed: 07/10/2015 Elsevier Interactive Patient Education  2018 [place], Care After These instructions provide you with information about caring for yourself after your procedure. Your health care provider may also give you more specific instructions. Your treatment has been planned according to current medical practices, but problems sometimes occur. Call your health care provider if you have any problems or questions after your procedure. What can I expect after the procedure? After your procedure, it is common to:  Feel sleepy for several hours.  Feel clumsy and have poor balance for several hours.  Feel forgetful about what happened after the procedure.  Have poor judgment for several hours.  Feel nauseous or vomit.  Have a sore throat if you had a breathing tube during the procedure.  Follow these instructions at home: For at least 24 hours after the procedure:   Do not: ? Participate in activities in which you could fall  or become injured. ? Drive. ? Use heavy  machinery. ? Drink alcohol. ? Take sleeping pills or medicines that cause drowsiness. ? Make important decisions or sign legal documents. ? Take care of children on your own.  Rest. Eating and drinking  Follow the diet that is recommended by your health care provider.  If you vomit, drink water, juice, or soup when you can drink without vomiting.  Make sure you have little or no nausea before eating solid foods. General instructions  Have a responsible adult stay with you until you are awake and alert.  Take over-the-counter and prescription medicines only as told by your health care provider.  If you smoke, do not smoke without supervision.  Keep all follow-up visits as told by your health care provider. This is important. Contact a health care provider if:  You keep feeling nauseous or you keep vomiting.  You feel light-headed.  You develop a rash.  You have a fever. Get help right away if:  You have trouble breathing. This information is not intended to replace advice given to you by your health care provider. Make sure you discuss any questions you have with your health care provider. Document Released: 07/10/2015 Document Revised: 11/09/2015 Document Reviewed: 07/10/2015 Elsevier Interactive Patient Education  [employer].

## 2017-10-21 ENCOUNTER — Encounter (HOSPITAL_COMMUNITY): Payer: Self-pay

## 2017-10-21 ENCOUNTER — Encounter (HOSPITAL_COMMUNITY)
Admission: RE | Admit: 2017-10-21 | Discharge: 2017-10-21 | Disposition: A | Discharge status: Home / Self Care | Payer: Medicare Other | Source: Ambulatory Visit | Attending: Internal Medicine | Admitting: Internal Medicine

## 2017-10-21 ENCOUNTER — Other Ambulatory Visit: Payer: Self-pay

## 2017-10-21 ENCOUNTER — Other Ambulatory Visit (HOSPITAL_COMMUNITY): Payer: Medicare Other

## 2017-10-21 DIAGNOSIS — Z86010 Personal history of colonic polyps: Secondary | ICD-10-CM

## 2017-10-21 DIAGNOSIS — Z01812 Encounter for preprocedural laboratory examination: Secondary | ICD-10-CM | POA: Insufficient documentation

## 2017-10-21 DIAGNOSIS — Z01810 Encounter for preprocedural cardiovascular examination: Secondary | ICD-10-CM | POA: Insufficient documentation

## 2017-10-21 DIAGNOSIS — Z800 Family history of malignant neoplasm of digestive organs: Secondary | ICD-10-CM

## 2017-10-21 HISTORY — DX: Personal history of urinary calculi: Z87.442

## 2017-10-21 LAB — CBC WITH DIFFERENTIAL/PLATELET
BASOS ABS: 0 10*3/uL (ref 0.0–0.1)
Basophils Relative: 0 %
EOS PCT: 1 %
Eosinophils Absolute: 0.1 10*3/uL (ref 0.0–0.7)
HEMATOCRIT: 34.1 % — AB (ref 36.0–46.0)
Hemoglobin: 11.5 g/dL — ABNORMAL LOW (ref 12.0–15.0)
LYMPHS PCT: 42 %
Lymphs Abs: 1.8 10*3/uL (ref 0.7–4.0)
MCH: 34.5 pg — AB (ref 26.0–34.0)
MCHC: 33.7 g/dL (ref 30.0–36.0)
MCV: 102.4 fL — AB (ref 78.0–100.0)
MONO ABS: 0.3 10*3/uL (ref 0.1–1.0)
MONOS PCT: 8 %
NEUTROS ABS: 2 10*3/uL (ref 1.7–7.7)
Neutrophils Relative %: 49 %
PLATELETS: 204 10*3/uL (ref 150–400)
RBC: 3.33 MIL/uL — ABNORMAL LOW (ref 3.87–5.11)
RDW: 12.4 % (ref 11.5–15.5)
WBC: 4.1 10*3/uL (ref 4.0–10.5)

## 2017-10-21 LAB — BASIC METABOLIC PANEL
Anion gap: 7 (ref 5–15)
BUN: 10 mg/dL (ref 8–23)
CO2: 28 mmol/L (ref 22–32)
CREATININE: 0.77 mg/dL (ref 0.44–1.00)
Calcium: 8.8 mg/dL — ABNORMAL LOW (ref 8.9–10.3)
Chloride: 102 mmol/L (ref 98–111)
GFR calc Af Amer: >60 mL/min (ref >60)
GFR calc non Af Amer: >60 mL/min (ref >60)
Glucose, Bld: 94 mg/dL (ref 70–99)
Potassium: 3.8 mmol/L (ref 3.5–5.1)
Sodium: 137 mmol/L (ref 135–145)

## 2017-10-25 ENCOUNTER — Ambulatory Visit (HOSPITAL_COMMUNITY)
Admission: RE | Admit: 2017-10-25 | Discharge: 2017-10-25 | Disposition: A | Discharge status: Home / Self Care | Payer: Medicare Other | Source: Ambulatory Visit | Attending: Internal Medicine | Admitting: Internal Medicine

## 2017-10-25 ENCOUNTER — Ambulatory Visit (HOSPITAL_COMMUNITY): Payer: Medicare Other | Admitting: Anesthesiology

## 2017-10-25 ENCOUNTER — Other Ambulatory Visit: Payer: Self-pay

## 2017-10-25 ENCOUNTER — Encounter (HOSPITAL_COMMUNITY): Payer: Self-pay | Admitting: Emergency Medicine

## 2017-10-25 ENCOUNTER — Encounter (HOSPITAL_COMMUNITY)
Admission: RE | Disposition: A | Discharge status: Home / Self Care | Payer: Self-pay | Source: Ambulatory Visit | Attending: Internal Medicine

## 2017-10-25 DIAGNOSIS — D175 Benign lipomatous neoplasm of intra-abdominal organs: Secondary | ICD-10-CM | POA: Diagnosis not present

## 2017-10-25 DIAGNOSIS — Z800 Family history of malignant neoplasm of digestive organs: Secondary | ICD-10-CM | POA: Diagnosis not present

## 2017-10-25 DIAGNOSIS — Z1211 Encounter for screening for malignant neoplasm of colon: Secondary | ICD-10-CM | POA: Insufficient documentation

## 2017-10-25 DIAGNOSIS — K5909 Other constipation: Secondary | ICD-10-CM | POA: Insufficient documentation

## 2017-10-25 DIAGNOSIS — M797 Fibromyalgia: Secondary | ICD-10-CM | POA: Diagnosis not present

## 2017-10-25 DIAGNOSIS — Z888 Allergy status to other drugs, medicaments and biological substances status: Secondary | ICD-10-CM | POA: Insufficient documentation

## 2017-10-25 DIAGNOSIS — Z79899 Other long term (current) drug therapy: Secondary | ICD-10-CM | POA: Diagnosis not present

## 2017-10-25 DIAGNOSIS — Z860100 Personal history of colon polyps, unspecified: Secondary | ICD-10-CM | POA: Insufficient documentation

## 2017-10-25 DIAGNOSIS — Z8249 Family history of ischemic heart disease and other diseases of the circulatory system: Secondary | ICD-10-CM | POA: Insufficient documentation

## 2017-10-25 DIAGNOSIS — F329 Major depressive disorder, single episode, unspecified: Secondary | ICD-10-CM | POA: Insufficient documentation

## 2017-10-25 DIAGNOSIS — E7800 Pure hypercholesterolemia, unspecified: Secondary | ICD-10-CM | POA: Insufficient documentation

## 2017-10-25 DIAGNOSIS — E039 Hypothyroidism, unspecified: Secondary | ICD-10-CM | POA: Diagnosis not present

## 2017-10-25 DIAGNOSIS — Z885 Allergy status to narcotic agent status: Secondary | ICD-10-CM | POA: Diagnosis not present

## 2017-10-25 DIAGNOSIS — M1990 Unspecified osteoarthritis, unspecified site: Secondary | ICD-10-CM | POA: Diagnosis not present

## 2017-10-25 DIAGNOSIS — Z880 Allergy status to penicillin: Secondary | ICD-10-CM | POA: Insufficient documentation

## 2017-10-25 DIAGNOSIS — F410 Panic disorder [episodic paroxysmal anxiety] without agoraphobia: Secondary | ICD-10-CM | POA: Insufficient documentation

## 2017-10-25 DIAGNOSIS — Z09 Encounter for follow-up examination after completed treatment for conditions other than malignant neoplasm: Secondary | ICD-10-CM | POA: Diagnosis not present

## 2017-10-25 DIAGNOSIS — F419 Anxiety disorder, unspecified: Secondary | ICD-10-CM | POA: Insufficient documentation

## 2017-10-25 DIAGNOSIS — K219 Gastro-esophageal reflux disease without esophagitis: Secondary | ICD-10-CM | POA: Insufficient documentation

## 2017-10-25 DIAGNOSIS — D125 Benign neoplasm of sigmoid colon: Secondary | ICD-10-CM | POA: Insufficient documentation

## 2017-10-25 DIAGNOSIS — Z91012 Allergy to eggs: Secondary | ICD-10-CM | POA: Diagnosis not present

## 2017-10-25 DIAGNOSIS — Q438 Other specified congenital malformations of intestine: Secondary | ICD-10-CM | POA: Insufficient documentation

## 2017-10-25 HISTORY — PX: POLYPECTOMY: SHX5525

## 2017-10-25 HISTORY — PX: COLONOSCOPY WITH PROPOFOL: SHX5780

## 2017-10-25 SURGERY — COLONOSCOPY WITH PROPOFOL
Anesthesia: Monitor Anesthesia Care

## 2017-10-25 MED ORDER — PROPOFOL 500 MG/50ML IV EMUL
INTRAVENOUS | Status: DC | PRN
  Administered 2017-10-25: 11:00:00 via INTRAVENOUS
  Administered 2017-10-25: 100 ug/kg/min via INTRAVENOUS
  Administered 2017-10-25: 12:00:00 via INTRAVENOUS

## 2017-10-25 MED ORDER — PROPOFOL 10 MG/ML IV BOLUS
INTRAVENOUS | Status: AC
  Filled 2017-10-25: qty 40

## 2017-10-25 MED ORDER — SODIUM CHLORIDE 0.9 % IJ SOLN
INTRAMUSCULAR | Status: AC
  Filled 2017-10-25: qty 10

## 2017-10-25 MED ORDER — LACTATED RINGERS IV SOLN: INTRAVENOUS | Status: DC

## 2017-10-25 MED ORDER — EPHEDRINE SULFATE 50 MG/ML IJ SOLN
INTRAMUSCULAR | Status: AC
  Filled 2017-10-25: qty 1

## 2017-10-25 MED ORDER — MIDAZOLAM HCL 2 MG/2ML IJ SOLN
INTRAMUSCULAR | Status: AC
  Filled 2017-10-25: qty 2

## 2017-10-25 MED ORDER — ONDANSETRON HCL 4 MG/2ML IJ SOLN: 4.0000 mg | Freq: Once | INTRAMUSCULAR | Status: DC | PRN

## 2017-10-25 MED ORDER — LACTATED RINGERS IV SOLN
INTRAVENOUS | Status: DC | PRN
  Administered 2017-10-25: 09:00:00 via INTRAVENOUS

## 2017-10-25 MED ORDER — MEPERIDINE HCL 100 MG/ML IJ SOLN: 6.2500 mg | INTRAMUSCULAR | Status: DC | PRN

## 2017-10-25 MED ORDER — MIDAZOLAM HCL 5 MG/5ML IJ SOLN
INTRAMUSCULAR | Status: DC | PRN
  Administered 2017-10-25: 2 mg via INTRAVENOUS

## 2017-10-25 NOTE — H&P (Signed)
Danielle Byrd is an 62 y.o. female.   Chief Complaint: Patient is here for colonoscopy. HPI: Patient is 62 year old Caucasian female who has a history of colonic adenomas and family history of CRC and is here for surveillance colonoscopy.  She has chronic constipation.  She denies abdominal pain or rectal bleeding.  Last colonoscopy was in April 2014 and no polyps were found.  She had adenomas on prior exams.  Father had colon carcinoma in his late 65s and died at 42. Paternal uncle and paternal grandmother also had colon cancer.  Past Medical History:  Diagnosis Date  . Anemia   . Anxiety   . Arthritis   . Cervical stenosis (uterine cervix)   . Closed fracture of left foot   . Constipation   . Copper deficiency   . Depression   . Fibromyalgia   . GERD (gastroesophageal reflux disease)   . High cholesterol   . History of kidney stones   . Hyperlipidemia   . Hypothyroidism   . Kidney stones   . Lumbar stenosis 11/27/2016  . Melanosis coli   . Migraines   . Nephrolithiasis   . Panic attack   . Rosacea   . Sliding hiatal hernia   . Spondylosis, cervical     Past Surgical History:  Procedure Laterality Date  . ABDOMINAL HYSTERECTOMY    . BONE MARROW BIOPSY     & aspirate  . BREAST SURGERY Right    partial mastectomy  . CHOLECYSTECTOMY    . COLONOSCOPY N/A 07/22/2012   Procedure: COLONOSCOPY;  Surgeon: [name], MD;  Location: [facility];  Service: Endoscopy;  Laterality: N/A;  730  . DILATION AND CURETTAGE OF UTERUS    . EYE SURGERY     sty removal and reconstruction of eye lids.  Marland Kitchen NISSEN FUNDOPLICATION      Family History  Problem Relation Age of Onset  . Diabetes Mother   . Hypertension Mother   . Anemia Mother   . Colon cancer Father   . Cancer Father 48       colon cancer  . Heart attack Father    Social History:  reports that she has never smoked. She has never used smokeless tobacco. She reports that she does not drink alcohol or use  drugs.  Allergies:  Allergies  Allergen Reactions  . Reglan [Metoclopramide]     Per the patient it caused headaches that was getting worse, also had started to bother her vision.  . Codeine Itching  . Cymbalta [Duloxetine Hcl] Nausea Only  . Eggs Or Egg-Derived Products     Upset stomach  . Gabapentin Nausea Only  . Hydrocodone-Acetaminophen Diarrhea    Nausea and vomiting  . Penicillins Hives    Also experienced itching .Has patient had a PCN reaction causing immediate rash, facial/tongue/throat swelling, SOB or lightheadedness with hypotension: Yes Has patient had a PCN reaction causing severe rash involving mucus membranes or skin necrosis: No Has patient had a PCN reaction that required hospitalization: Unknown Has patient had a PCN reaction occurring within the last 10 years: No If all of the above answers are "NO", then may proceed with Cephalosporin use.   Ocie Cornfield [Milnacipran Hcl] Itching  . Statins     Unknown reaction, pt states she had to go to the hospital  . Voltaren [Diclofenac Sodium] Nausea Only    Patient cites reaction is only to oral tablets  . Lyrica [Pregabalin] Swelling and Rash    Medications  Prior to Admission  Medication Sig Dispense Refill  . acetaminophen (TYLENOL) 500 MG tablet Take 1,000 mg by mouth 2 (two) times daily as needed for moderate pain, fever or headache.     . ALPRAZolam (XANAX) 0.25 MG tablet Take 0.25 mg by mouth 2 (two) times daily.     Marland Kitchen ALPRAZolam (XANAX) 1 MG tablet Take 1 mg by mouth at bedtime.     . Ascorbic Acid (VITAMIN C) 1000 MG tablet Take 1,000 mg by mouth daily.    . Biotin 5000 MCG TABS Take 5,000 mcg by mouth at bedtime.     . bisacodyl (DULCOLAX) 10 MG suppository Place 10 mg rectally as needed for moderate constipation.    . Calcium Carbonate-Vit D-Min (CALCIUM 1200) 1200-1000 MG-UNIT CHEW Chew 1 tablet by mouth daily.    . Cholecalciferol ([employer] VITAMIN D3) 10000 units CAPS Take 10,000 Units by mouth 2 (two) times  daily.     . Coenzyme Q10 (COQ10) 200 MG CAPS Take 200 mg by mouth daily.    . cyanocobalamin (,VITAMIN B-12,) 1000 MCG/ML injection Inject 1,000 mcg into the muscle every 30 (thirty) days.     . diclofenac sodium (VOLTAREN) 1 % GEL Apply 1 application topically 3 (three) times daily.    . Ginger, Zingiber officinalis, (GINGER ROOT) 550 MG CAPS Take 550 mg by mouth daily.     . Iron-Vitamin C (VITRON-C) 65-125 MG TABS Take 1 tablet by mouth daily.    Marland Kitchen levothyroxine (SYNTHROID, LEVOTHROID) 50 MCG tablet Take 50 mcg by mouth daily.    . methocarbamol (ROBAXIN) 500 MG tablet Take 500 mg by mouth 3 (three) times daily.    Marland Kitchen Propylene Glycol (SYSTANE BALANCE) 0.6 % SOLN Place 2 drops into both eyes 3 (three) times daily.    . RABEprazole (ACIPHEX) 20 MG tablet Take 1 tablet (20 mg total) by mouth 2 (two) times daily. (Patient taking differently: Take 20 mg by mouth daily. ) 90 tablet 3  . sucralfate (CARAFATE) 1 GM/10ML suspension Take 1 g by mouth daily as needed (severe indigestion).     . traZODone (DESYREL) 50 MG tablet Take 50 mg by mouth at bedtime.    . TURMERIC PO Take 538 mg by mouth daily.    . Diclofenac Sodium 2 % SOLN Rub two pumps of solution to painful joint(s) up to two times daily. (Patient not taking: Reported on 10/16/2017) 3 Bottle 3    No results found for this or any previous visit (from the past 48 hour(s)). No results found.  ROS  Blood pressure 133/74, pulse 60, temperature 98 F (36.7 C), temperature source Oral, resp. rate 17, SpO2 97 %. Physical Exam  Constitutional: She appears well-developed and well-nourished.  HENT:  Mouth/Throat: Oropharynx is clear and moist.  Eyes: Conjunctivae are normal. No scleral icterus.  Neck: No thyromegaly present.  Cardiovascular: Normal rate, regular rhythm and normal heart sounds.  No murmur heard. Respiratory: Effort normal and breath sounds normal.  GI:  Abdomen is symmetrical with horizontal hypogastric scar.  Abdomen is  soft and nontender with organomegaly or masses.  Musculoskeletal: She exhibits no edema.  Lymphadenopathy:    She has no cervical adenopathy.  Neurological: She is alert.  Skin: Skin is warm and dry.     Assessment/Plan History of colonic adenomas. Family history of CRC and first and second-degree relatives. Surveillance colonoscopy.  Hildred Laser, MD 10/25/2017, 9:25 AM

## 2017-10-25 NOTE — Op Note (Signed)
[place] [facility] Patient Name: Danielle Byrd Procedure Date: 10/25/2017 10:01 AM MRN: [id number] Date of Birth: 1955-11-27 Attending MD: Hildred Laser , MD CSN: [id number] Age: 62 Admit Type: Outpatient Procedure:                Colonoscopy Indications:              High risk colon cancer surveillance: Personal                            history of colonic polyps, Family history of colon                            cancer in a first-degree relative, Family history                            of colon cancer in multiple second-degree relatives Providers:                Hildred Laser, MD, Lurline Del, RN, Randa Spike,                            Technician Referring MD:             Allie Dimmer, MD Medicines:                Propofol per Anesthesia Complications:            No immediate complications. Estimated Blood Loss:     Estimated blood loss was minimal. Procedure:                After obtaining informed consent, the colonoscope                            was passed under direct vision. Throughout the                            procedure, the patient's blood pressure, pulse, and                            oxygen saturations were monitored continuously. The                            PCF-H190DL ([id number]) scope was introduced through                            the and advanced to the the cecum, identified by                            appendiceal orifice and ileocecal valve. The                            colonoscopy was technically difficult and complex                            due to significant looping. Successful completion  of the procedure was aided by changing the patient                            to a supine position, withdrawing and reinserting                            the scope and withdrawing the scope and replacing                            with the 'babyscope'. The patient tolerated the                            procedure well. The  quality of the bowel                            preparation was good. The ileocecal valve,                            appendiceal orifice, and rectum were photographed. Scope In: 10:57:27 AM Scope Out: 11:42:32 AM Scope Withdrawal Time: 0 hours 10 minutes 16 seconds  Total Procedure Duration: 0 hours 45 minutes 5 seconds  Findings:      The perianal and digital rectal examinations were normal.      There was a medium-sized lipoma, 10 mm in diameter, at the hepatic       flexure.      A small polyp was found in the mid sigmoid colon. The polyp was sessile.       The polyp was removed with a cold snare. Resection and retrieval were       complete.      The retroflexed view of the distal rectum and anal verge was normal and       showed no anal or rectal abnormalities. 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                           - One small polyp in the mid sigmoid colon, removed                            with a cold snare. Resected and retrieved. Moderate Sedation:      Per Anesthesia Care Recommendation:           - Patient has a contact number available for                            emergencies. The signs and symptoms of potential                            delayed complications were discussed with the                            patient. Return to normal activities tomorrow.  Written discharge instructions were provided to the                            patient.                           - Resume previous diet today.                           - Continue present medications.                           - Await pathology results.                           - No aspirin, ibuprofen, naproxen, or other                            non-steroidal anti-inflammatory drugs for 1 day.                           - Repeat colonoscopy in 5 years for surveillance. Procedure Code(s):        --- Professional ---                            [phone], Colonoscopy, flexible; with removal of                            tumor(s), polyp(s), or other lesion(s) by snare                            technique Diagnosis Code(s):        --- Professional ---                           Z86.010, Personal history of colonic polyps                           Z80.0, Family history of malignant neoplasm of                            digestive organs                           D17.5, Benign lipomatous neoplasm of                            intra-abdominal organs                           D12.5, Benign neoplasm of sigmoid colon CPT copyright 2017 [employer]. All rights reserved. The codes documented in this report are preliminary and upon coder review may  be revised to meet current compliance requirements. Hildred Laser, MD Hildred Laser, MD 10/25/2017 11:49:52 AM This report has been signed electronically. Number of Addenda: 0

## 2017-10-25 NOTE — Discharge Instructions (Signed)
No aspirin or NSAIDs for 24 hours. Resume usual medications as before. Resume usual diet. No driving for 24 hours. Physician will call with biopsy results.     Colonoscopy, Adult, Care After This sheet gives you information about how to care for yourself after your procedure. Your doctor may also give you more specific instructions. If you have problems or questions, call your doctor. Follow these instructions at home: General instructions   For the first 24 hours after the procedure: ? Do not drive or use machinery. ? Do not sign important documents. ? Do not drink alcohol. ? Do your daily activities more slowly than normal. ? Eat foods that are soft and easy to digest. ? Rest often.  Take over-the-counter or prescription medicines only as told by your doctor.  It is up to you to get the results of your procedure. Ask your doctor, or the department performing the procedure, when your results will be ready. To help cramping and bloating:  Try walking around.  Put heat on your belly (abdomen) as told by your doctor. Use a heat source that your doctor recommends, such as a moist heat pack or a heating pad. ? Put a towel between your skin and the heat source. ? Leave the heat on for 20-30 minutes. ? Remove the heat if your skin turns bright red. This is especially important if you cannot feel pain, heat, or cold. You can get burned. Eating and drinking  Drink enough fluid to keep your pee (urine) clear or pale yellow.  Return to your normal diet as told by your doctor. Avoid heavy or fried foods that are hard to digest.  Avoid drinking alcohol for as long as told by your doctor. Contact a doctor if:  You have blood in your poop (stool) 2-3 days after the procedure. Get help right away if:  You have more than a small amount of blood in your poop.  You see large clumps of tissue (blood clots) in your poop.  Your belly is swollen.  You feel sick to your stomach  (nauseous).  You throw up (vomit).  You have a fever.  You have belly pain that gets worse, and medicine does not help your pain. This information is not intended to replace advice given to you by your health care provider. Make sure you discuss any questions you have with your health care provider. Document Released: 04/21/2010 Document Revised: 12/12/2015 Document Reviewed: 12/12/2015 Elsevier Interactive Patient Education  2017 [place].     Colon Polyps Polyps are tissue growths inside the body. Polyps can grow in many places, including the large intestine (colon). A polyp may be a round bump or a mushroom-shaped growth. You could have one polyp or several. Most colon polyps are noncancerous (benign). However, some colon polyps can become cancerous over time. What are the causes? The exact cause of colon polyps is not known. What increases the risk? This condition is more likely to develop in people who:  Have a family history of colon cancer or colon polyps.  Are older than 37 or older than 45 if they are African American.  Have inflammatory bowel disease, such as ulcerative colitis or Crohn disease.  Are overweight.  Smoke cigarettes.  Do not get enough exercise.  Drink too much alcohol.  Eat a diet that is: ? High in fat and red meat. ? Low in fiber.  Had childhood cancer that was treated with abdominal radiation.  What are the signs or symptoms?  Most polyps do not cause symptoms. If you have symptoms, they may include:  Blood coming from your rectum when having a bowel movement.  Blood in your stool.The stool may look dark red or black.  A change in bowel habits, such as constipation or diarrhea.  How is this diagnosed? This condition is diagnosed with a colonoscopy. This is a procedure that uses a lighted, flexible scope to look at the inside of your colon. How is this treated? Treatment for this condition involves removing any polyps that are  found. Those polyps will then be tested for cancer. If cancer is found, your health care provider will talk to you about options for colon cancer treatment. Follow these instructions at home: Diet  Eat plenty of fiber, such as fruits, vegetables, and whole grains.  Eat foods that are high in calcium and vitamin D, such as milk, cheese, yogurt, eggs, liver, fish, and broccoli.  Limit foods high in fat, red meats, and processed meats, such as hot dogs, sausage, bacon, and lunch meats.  Maintain a healthy weight, or lose weight if recommended by your health care provider. General instructions  Do not smoke cigarettes.  Do not drink alcohol excessively.  Keep all follow-up visits as told by your health care provider. This is important. This includes keeping regularly scheduled colonoscopies. Talk to your health care provider about when you need a colonoscopy.  Exercise every day or as told by your health care provider. Contact a health care provider if:  You have new or worsening bleeding during a bowel movement.  You have new or increased blood in your stool.  You have a change in bowel habits.  You unexpectedly lose weight. This information is not intended to replace advice given to you by your health care provider. Make sure you discuss any questions you have with your health care provider. Document Released: 12/14/2003 Document Revised: 08/25/2015 Document Reviewed: 02/07/2015 Elsevier Interactive Patient Education  2018 [place], Care After These instructions provide you with information about caring for yourself after your procedure. Your health care provider may also give you more specific instructions. Your treatment has been planned according to current medical practices, but problems sometimes occur. Call your health care provider if you have any problems or questions after your procedure. What can I expect after the procedure? After your  procedure, it is common to:  Feel sleepy for several hours.  Feel clumsy and have poor balance for several hours.  Feel forgetful about what happened after the procedure.  Have poor judgment for several hours.  Feel nauseous or vomit.  Have a sore throat if you had a breathing tube during the procedure.  Follow these instructions at home: For at least 24 hours after the procedure:   Do not: ? Participate in activities in which you could fall or become injured. ? Drive. ? Use heavy machinery. ? Drink alcohol. ? Take sleeping pills or medicines that cause drowsiness. ? Make important decisions or sign legal documents. ? Take care of children on your own.  Rest. Eating and drinking  Follow the diet that is recommended by your health care provider.  If you vomit, drink water, juice, or soup when you can drink without vomiting.  Make sure you have little or no nausea before eating solid foods. General instructions  Have a responsible adult stay with you until you are awake and alert.  Take over-the-counter and prescription medicines only as told by your  health care provider.  If you smoke, do not smoke without supervision.  Keep all follow-up visits as told by your health care provider. This is important. Contact a health care provider if:  You keep feeling nauseous or you keep vomiting.  You feel light-headed.  You develop a rash.  You have a fever. Get help right away if:  You have trouble breathing. This information is not intended to replace advice given to you by your health care provider. Make sure you discuss any questions you have with your health care provider. Document Released: 07/10/2015 Document Revised: 11/09/2015 Document Reviewed: 07/10/2015 Elsevier Interactive Patient Education  [employer].

## 2017-10-25 NOTE — Anesthesia Postprocedure Evaluation (Signed)
Anesthesia Post Note  Patient: Danielle Byrd  Procedure(s) Performed: COLONOSCOPY WITH PROPOFOL (N/A ) POLYPECTOMY  Patient location during evaluation: PACU Anesthesia Type: MAC Level of consciousness: awake and patient cooperative Pain management: pain level controlled Vital Signs Assessment: post-procedure vital signs reviewed and stable Respiratory status: spontaneous breathing, nonlabored ventilation and respiratory function stable Cardiovascular status: blood pressure returned to baseline Postop Assessment: no apparent nausea or vomiting Anesthetic complications: no     Last Vitals:  Vitals:   10/25/17 0913 10/25/17 0953  BP: 133/74 124/80  Pulse: 60 64  Resp: 17 15  Temp: 36.7 C   SpO2: 97% 95%    Last Pain:  Vitals:   10/25/17 1057  TempSrc:   PainSc: 4                  Teresia Myint J

## 2017-10-25 NOTE — Transfer of Care (Signed)
Immediate Anesthesia Transfer of Care Note  Patient: Danielle Byrd  Procedure(s) Performed: COLONOSCOPY WITH PROPOFOL (N/A ) POLYPECTOMY  Patient Location: PACU  Anesthesia Type:MAC  Level of Consciousness: drowsy  Airway & Oxygen Therapy: Patient Spontanous Breathing  Post-op Assessment: Report given to RN, Post -op Vital signs reviewed and stable and Patient moving all extremities  Post vital signs: Reviewed and stable  Last Vitals:  Vitals Value Taken Time  BP    Temp    Pulse 78 10/25/2017 11:49 AM  Resp    SpO2 97 % 10/25/2017 11:49 AM  Vitals shown include unvalidated device data.  Last Pain:  Vitals:   10/25/17 1057  TempSrc:   PainSc: 4          Complications: No apparent anesthesia complications

## 2017-10-25 NOTE — Anesthesia Preprocedure Evaluation (Signed)
Anesthesia Evaluation  Patient identified by MRN, date of birth, ID band Patient awake    Reviewed: Allergy & Precautions, H&P , NPO status , Patient's Chart, lab work & pertinent test results, reviewed documented beta blocker date and time   Airway Mallampati: II  TM Distance: >3 FB Neck ROM: full    Dental no notable dental hx.    Pulmonary neg pulmonary ROS,    Pulmonary exam normal breath sounds clear to auscultation       Cardiovascular Exercise Tolerance: Good negative cardio ROS   Rhythm:regular Rate:Normal     Neuro/Psych  Headaches,  Neuromuscular disease negative neurological ROS  negative psych ROS   GI/Hepatic negative GI ROS, Neg liver ROS, hiatal hernia, GERD  ,  Endo/Other  negative endocrine ROSHypothyroidism   Renal/GU Renal diseasenegative Renal ROS  negative genitourinary   Musculoskeletal   Abdominal   Peds  Hematology negative hematology ROS (+) anemia ,   Anesthesia Other Findings   Reproductive/Obstetrics negative OB ROS                             Anesthesia Physical Anesthesia Plan  ASA: II  Anesthesia Plan: MAC   Post-op Pain Management:    Induction:   PONV Risk Score and Plan: Propofol infusion and TIVA  Airway Management Planned:   Additional Equipment:   Intra-op Plan:   Post-operative Plan:   Informed Consent: I have reviewed the patients History and Physical, chart, labs and discussed the procedure including the risks, benefits and alternatives for the proposed anesthesia with the patient or authorized representative who has indicated his/her understanding and acceptance.   Dental Advisory Given  Plan Discussed with: CRNA  Anesthesia Plan Comments:         Anesthesia Quick Evaluation

## 2017-10-28 ENCOUNTER — Telehealth (INDEPENDENT_AMBULATORY_CARE_PROVIDER_SITE_OTHER): Payer: Self-pay | Admitting: *Deleted

## 2017-10-28 NOTE — Telephone Encounter (Signed)
Patient had TCS on 10/18/17 -- still experiencing abdominal pain, gas and bloating, very uncomfortable -- started taking Simethicone 180 mg BID QD on Saturday -- wants to know what Dr Olevia Perches recommendations would be for this  Ph# [phone]

## 2017-10-28 NOTE — Progress Notes (Signed)
Patient complained of continuing "gas" pains that are unrelieved by medication. I recommended pt call Dr. Laural Golden to voice concern. Patient verbalizes understanding and agrees to do so.

## 2017-10-30 ENCOUNTER — Emergency Department (HOSPITAL_COMMUNITY)
Admission: EM | Admit: 2017-10-30 | Discharge: 2017-10-30 | Disposition: A | Discharge status: Home / Self Care | Payer: Medicare Other | Attending: Emergency Medicine | Admitting: Emergency Medicine

## 2017-10-30 ENCOUNTER — Encounter (HOSPITAL_COMMUNITY): Payer: Self-pay | Admitting: Internal Medicine

## 2017-10-30 ENCOUNTER — Emergency Department (HOSPITAL_COMMUNITY): Payer: Medicare Other

## 2017-10-30 ENCOUNTER — Other Ambulatory Visit: Payer: Self-pay

## 2017-10-30 ENCOUNTER — Telehealth (INDEPENDENT_AMBULATORY_CARE_PROVIDER_SITE_OTHER): Payer: Self-pay | Admitting: Internal Medicine

## 2017-10-30 DIAGNOSIS — R109 Unspecified abdominal pain: Secondary | ICD-10-CM

## 2017-10-30 DIAGNOSIS — Z79899 Other long term (current) drug therapy: Secondary | ICD-10-CM | POA: Diagnosis not present

## 2017-10-30 DIAGNOSIS — E039 Hypothyroidism, unspecified: Secondary | ICD-10-CM | POA: Insufficient documentation

## 2017-10-30 DIAGNOSIS — N200 Calculus of kidney: Secondary | ICD-10-CM | POA: Diagnosis not present

## 2017-10-30 DIAGNOSIS — R1084 Generalized abdominal pain: Secondary | ICD-10-CM | POA: Diagnosis not present

## 2017-10-30 LAB — COMPREHENSIVE METABOLIC PANEL
ALK PHOS: 72 U/L (ref 38–126)
ALT: 20 U/L (ref 0–44)
ANION GAP: 6 (ref 5–15)
AST: 23 U/L (ref 15–41)
Albumin: 3.9 g/dL (ref 3.5–5.0)
BILIRUBIN TOTAL: 0.4 mg/dL (ref 0.3–1.2)
BUN: 8 mg/dL (ref 8–23)
CO2: 30 mmol/L (ref 22–32)
Calcium: 9 mg/dL (ref 8.9–10.3)
Chloride: 103 mmol/L (ref 98–111)
Creatinine, Ser: 0.73 mg/dL (ref 0.44–1.00)
GLUCOSE: 111 mg/dL — AB (ref 70–99)
Potassium: 4 mmol/L (ref 3.5–5.1)
Sodium: 139 mmol/L (ref 135–145)
TOTAL PROTEIN: 6.4 g/dL — AB (ref 6.5–8.1)

## 2017-10-30 LAB — CBC WITH DIFFERENTIAL/PLATELET
Basophils Absolute: 0 10*3/uL (ref 0.0–0.1)
Basophils Relative: 0 %
Eosinophils Absolute: 0.1 10*3/uL (ref 0.0–0.7)
Eosinophils Relative: 2 %
HEMATOCRIT: 32.5 % — AB (ref 36.0–46.0)
Hemoglobin: 11 g/dL — ABNORMAL LOW (ref 12.0–15.0)
Lymphocytes Relative: 59 %
Lymphs Abs: 2 10*3/uL (ref 0.7–4.0)
MCH: 34.3 pg — ABNORMAL HIGH (ref 26.0–34.0)
MCHC: 33.8 g/dL (ref 30.0–36.0)
MCV: 101.2 fL — ABNORMAL HIGH (ref 78.0–100.0)
MONO ABS: 0.3 10*3/uL (ref 0.1–1.0)
Monocytes Relative: 9 %
NEUTROS ABS: 1 10*3/uL — AB (ref 1.7–7.7)
Neutrophils Relative %: 30 %
Platelets: 169 10*3/uL (ref 150–400)
RBC: 3.21 MIL/uL — ABNORMAL LOW (ref 3.87–5.11)
RDW: 12.2 % (ref 11.5–15.5)
WBC: 3.3 10*3/uL — ABNORMAL LOW (ref 4.0–10.5)

## 2017-10-30 LAB — LIPASE, BLOOD: Lipase: 23 U/L (ref 11–51)

## 2017-10-30 MED ORDER — FENTANYL CITRATE (PF) 100 MCG/2ML IJ SOLN
50.0000 ug | Freq: Once | INTRAMUSCULAR | Status: AC
  Administered 2017-10-30: 50 ug via INTRAVENOUS
  Filled 2017-10-30: qty 2

## 2017-10-30 MED ORDER — DICYCLOMINE HCL 10 MG PO CAPS
10.0000 mg | ORAL_CAPSULE | Freq: Once | ORAL | Status: AC
  Administered 2017-10-30: 10 mg via ORAL
  Filled 2017-10-30: qty 1

## 2017-10-30 MED ORDER — DICYCLOMINE HCL 10 MG PO CAPS: 10.0000 mg | ORAL_CAPSULE | Freq: Three times a day (TID) | ORAL | 0 refills | Status: DC | PRN

## 2017-10-30 MED ORDER — IOPAMIDOL (ISOVUE-300) INJECTION 61%
100.0000 mL | Freq: Once | INTRAVENOUS | Status: AC | PRN
  Administered 2017-10-30: 100 mL via INTRAVENOUS

## 2017-10-30 MED ORDER — SODIUM CHLORIDE 0.9 % IV BOLUS
500.0000 mL | Freq: Once | INTRAVENOUS | Status: AC
  Administered 2017-10-30: 500 mL via INTRAVENOUS

## 2017-10-30 NOTE — ED Triage Notes (Signed)
Pt had a colonoscopy x 5 days ago and pt states she is still having severe abdominal cramping and bloating; pt states she has been passing gas and had a BM today

## 2017-10-30 NOTE — Telephone Encounter (Signed)
I have spoken with patient. She says she feels a little better. Continue the Simethicone. She was advised if her abdomen becomes distended, to go to the ED

## 2017-10-30 NOTE — ED Provider Notes (Signed)
[facility] EMERGENCY DEPARTMENT Provider Note   CSN: [id number] Arrival date & time: 10/30/17  1821     History   Chief Complaint Chief Complaint  Patient presents with  . Abdominal Pain    HPI JAQUALA FULLER is a 62 y.o. female.  HPI Patient presents with abdominal pain.  Had colonoscopy around 5 days ago and since then has had abdominal pain and bloating.  Has had medicines through her gastroenterologist to help with the pain.  No relief.  Has had cramping pain.  Has had some diarrhea.  Still passing gas.  No nausea vomiting.  States abdomen is distended.  Continued pain so was told to come to the ER. Past Medical History:  Diagnosis Date  . Anemia   . Anxiety   . Arthritis   . Cervical stenosis (uterine cervix)   . Closed fracture of left foot   . Constipation   . Copper deficiency   . Depression   . Fibromyalgia   . GERD (gastroesophageal reflux disease)   . High cholesterol   . History of kidney stones   . Hyperlipidemia   . Hypothyroidism   . Kidney stones   . Lumbar stenosis 11/27/2016  . Melanosis coli   . Migraines   . Nephrolithiasis   . Panic attack   . Rosacea   . Sliding hiatal hernia   . Spondylosis, cervical     Patient Active Problem List   Diagnosis Date Noted  . Hx of colonic polyps 09/05/2017  . Family hx of colon cancer 09/05/2017  . Migraine without aura and without status migrainosus, not intractable 03/04/2017  . Fibromyalgia 11/22/2016  . Other fatigue 11/22/2016  . DDD (degenerative disc disease), cervical 11/22/2016  . Primary osteoarthritis of both hands 11/22/2016  . History of migraine 11/22/2016  . History of anxiety 11/22/2016  . History of hypothyroidism 11/22/2016  . History of kidney stones 11/22/2016  . History of cholecystectomy 11/22/2016  . History of anemia 11/22/2016  . Hx of bone density study/ normal 2014 this is followed by her Primary Care  11/22/2016  . Sicca syndrome ([place]) 11/22/2016  . Paroxysmal  hemicrania 01/28/2014  . Headache(784.0) 10/27/2013  . Leukopenia 07/08/2013  . Anemia 10/31/2011  . Iron deficiency 10/31/2011  . GERD (gastroesophageal reflux disease) 05/14/2011  . Abdominal pain, bilateral upper quadrant 05/14/2011  . H/O fibromyalgia 05/14/2011  . Constipation 05/14/2011  . Hyperlipemia 05/14/2011  . Kidney stones 01/11/2011    Past Surgical History:  Procedure Laterality Date  . ABDOMINAL HYSTERECTOMY    . BONE MARROW BIOPSY     & aspirate  . BREAST SURGERY Right    partial mastectomy  . CHOLECYSTECTOMY    . COLONOSCOPY N/A 07/22/2012   Procedure: COLONOSCOPY;  Surgeon: [name], MD;  Location: [facility];  Service: Endoscopy;  Laterality: N/A;  730  . COLONOSCOPY WITH PROPOFOL N/A 10/25/2017   Procedure: COLONOSCOPY WITH PROPOFOL;  Surgeon: [name], MD;  Location: [facility];  Service: Endoscopy;  Laterality: N/A;  10:30  . DILATION AND CURETTAGE OF UTERUS    . EYE SURGERY     sty removal and reconstruction of eye lids.  Marland Kitchen NISSEN FUNDOPLICATION    . POLYPECTOMY  10/25/2017   Procedure: POLYPECTOMY;  Surgeon: [name], MD;  Location: [facility];  Service: Endoscopy;;  sigmoid      OB History   None      Home Medications    Prior to  Admission medications   Medication Sig Start Date End Date Taking? Authorizing Provider  acetaminophen (TYLENOL) 500 MG tablet Take 1,000 mg by mouth 2 (two) times daily as needed for moderate pain, fever or headache.    Yes [provider]  ALPRAZolam (XANAX) 0.25 MG tablet Take 0.25 mg by mouth 2 (two) times daily.    Yes [provider]  ALPRAZolam Duanne Moron) 1 MG tablet Take 1 mg by mouth at bedtime.  07/28/15  Yes [provider]  Ascorbic Acid (VITAMIN C) 1000 MG tablet Take 1,000 mg by mouth daily.   Yes [provider]  Biotin 5000 MCG TABS Take 5,000 mcg by mouth at bedtime.    Yes [provider]  bisacodyl (DULCOLAX) 10 MG suppository  Place 10 mg rectally as needed for moderate constipation.   Yes [provider]  Calcium Carbonate-Vit D-Min (CALCIUM 1200) 1200-1000 MG-UNIT CHEW Chew 1 tablet by mouth daily.   Yes [provider]  Cholecalciferol ([employer] VITAMIN D3) 10000 units CAPS Take 10,000 Units by mouth 2 (two) times daily.    Yes [provider]  cyanocobalamin (,VITAMIN B-12,) 1000 MCG/ML injection Inject 1,000 mcg into the muscle every 30 (thirty) days.  06/25/13  Yes [provider]  diclofenac sodium (VOLTAREN) 1 % GEL Apply 1 application topically 3 (three) times daily.   Yes [provider]  Ginger, Zingiber officinalis, (GINGER ROOT) 550 MG CAPS Take 550 mg by mouth daily.    Yes [provider]  Iron-Vitamin C (VITRON-C) 65-125 MG TABS Take 1 tablet by mouth daily.   Yes [provider]  levothyroxine (SYNTHROID, LEVOTHROID) 50 MCG tablet Take 50 mcg by mouth daily.   Yes [provider]  methocarbamol (ROBAXIN) 500 MG tablet Take 500 mg by mouth 3 (three) times daily.   Yes [provider]  Propylene Glycol (SYSTANE BALANCE) 0.6 % SOLN Place 2 drops into both eyes 3 (three) times daily.   Yes [provider]  RABEprazole (ACIPHEX) 20 MG tablet Take 1 tablet (20 mg total) by mouth 2 (two) times daily. Patient taking differently: Take 20 mg by mouth daily.  12/13/15  Yes Setzer, Terri L, NP  sucralfate (CARAFATE) 1 GM/10ML suspension Take 1 g by mouth daily as needed (severe indigestion).    Yes [provider]  traZODone (DESYREL) 50 MG tablet Take 50 mg by mouth at bedtime.   Yes [provider]  TURMERIC PO Take 538 mg by mouth daily.   Yes [provider]  Coenzyme Q10 (COQ10) 200 MG CAPS Take 200 mg by mouth daily.    [provider]  dicyclomine (BENTYL) 10 MG capsule Take 1 capsule (10 mg total) by mouth 3 (three) times daily as needed for spasms. 10/30/17   Davonna Belling, MD    Family  History Family History  Problem Relation Age of Onset  . Diabetes Mother   . Hypertension Mother   . Anemia Mother   . Colon cancer Father   . Cancer Father 36       colon cancer  . Heart attack Father     Social History Social History   Tobacco Use  . Smoking status: Never Smoker  . Smokeless tobacco: Never Used  Substance Use Topics  . Alcohol use: No    Alcohol/week: 0.0 oz  . Drug use: No     Allergies   Reglan [metoclopramide]; Codeine; Cymbalta [duloxetine hcl]; Eggs or egg-derived products; Gabapentin; Hydrocodone-acetaminophen; Penicillins; Savella [milnacipran hcl];  Statins; Voltaren [diclofenac sodium]; and Lyrica [pregabalin]   Review of Systems Review of Systems  Constitutional: Positive for appetite change. Negative for fever.  HENT: Negative for congestion.   Respiratory: Negative for shortness of breath.   Cardiovascular: Negative for chest pain.  Gastrointestinal: Positive for abdominal distention, abdominal pain and diarrhea. Negative for constipation.  Genitourinary: Negative for flank pain.  Musculoskeletal: Negative for back pain.  Skin: Negative for rash.  Neurological: Negative for weakness.  Hematological: Negative for adenopathy.  Psychiatric/Behavioral: Negative for confusion.     Physical Exam Updated Vital Signs BP 129/70   Pulse 69   Temp (!) 97.1 F (36.2 C) (Tympanic)   Resp 16   Ht _0  (1.651 m)   Wt 54.4 kg (120 lb)   SpO2 96%   BMI 19.97 kg/m   Physical Exam  Constitutional: She appears well-developed.  HENT:  Head: Normocephalic.  Cardiovascular: Regular rhythm.  Abdominal:  Mild distention and diffuse tenderness.  Tenderness may be worse in the lower abdomen.  No rebound or guarding.  No hernia palpated.  Skin: Skin is warm. Capillary refill takes less than 2 seconds.     ED Treatments / Results  Labs (all labs ordered are listed, but only abnormal results are displayed) Labs Reviewed  COMPREHENSIVE  METABOLIC PANEL - Abnormal; Notable for the following components:      Result Value   Glucose, Bld 111 (*)    Total Protein 6.4 (*)    All other components within normal limits  CBC WITH DIFFERENTIAL/PLATELET - Abnormal; Notable for the following components:   WBC 3.3 (*)    RBC 3.21 (*)    Hemoglobin 11.0 (*)    HCT 32.5 (*)    MCV 101.2 (*)    MCH 34.3 (*)    Neutro Abs 1.0 (*)    All other components within normal limits  LIPASE, BLOOD    EKG None  Radiology Ct Abdomen Pelvis W Contrast  Result Date: 10/30/2017 CLINICAL DATA:  Severe abdominal cramping and bloating after colonoscopy. EXAM: CT ABDOMEN AND PELVIS WITH CONTRAST TECHNIQUE: Multidetector CT imaging of the abdomen and pelvis was performed using the standard protocol following bolus administration of intravenous contrast. CONTRAST:  158m ISOVUE-300 IOPAMIDOL (ISOVUE-300) INJECTION 61% COMPARISON:  None. FINDINGS: Lower chest: Unremarkable. Hepatobiliary: No focal abnormality within the liver parenchyma. Gallbladder surgically absent. No intrahepatic or extrahepatic biliary dilation. Pancreas: No focal mass lesion. No dilatation of the main duct. No intraparenchymal cyst. No peripancreatic edema. Spleen: No splenomegaly. No focal mass lesion. Adrenals/Urinary Tract: No adrenal nodule or mass. Right kidney unremarkable. Several tiny nonobstructing stones identified in the left kidney. No evidence for hydroureter. The urinary bladder appears normal for the degree of distention. Stomach/Bowel: Deformity in the proximal stomach is compatible with prior fundoplication. Stomach is nondistended. No gastric wall thickening. No evidence of outlet obstruction. Duodenum is normally positioned as is the ligament of Treitz. No small bowel wall thickening. No small bowel dilatation. The terminal ileum is normal. The appendix is normal. No gross colonic mass. No colonic wall thickening. No substantial diverticular change. Vascular/Lymphatic:  There is abdominal aortic atherosclerosis without aneurysm. There is no gastrohepatic or hepatoduodenal ligament lymphadenopathy. No intraperitoneal or retroperitoneal lymphadenopathy. No pelvic sidewall lymphadenopathy. Reproductive: Uterus surgically absent.  There is no adnexal mass. Other: No intraperitoneal free fluid.  No intraperitoneal free air. Musculoskeletal: No worrisome lytic or sclerotic osseous abnormality. IMPRESSION: 1. No acute findings in the abdomen or pelvis. Specifically, no evidence for colonic  wall thickening. No intraperitoneal free air or intraperitoneal free fluid. 2. Nonobstructing left renal stones. Electronically Signed   By: Misty Stanley M.D.   On: 10/30/2017 21:38    Procedures Procedures (including critical care time)  Medications Ordered in ED Medications  sodium chloride 0.9 % bolus 500 mL (0 mLs Intravenous Stopped 10/30/17 2042)  fentaNYL (SUBLIMAZE) injection 50 mcg (50 mcg Intravenous Given 10/30/17 2040)  iopamidol (ISOVUE-300) 61 % injection 100 mL (100 mLs Intravenous Contrast Given 10/30/17 2101)  dicyclomine (BENTYL) capsule 10 mg (10 mg Oral Given 10/30/17 2241)     Initial Impression / Assessment and Plan / ED Course  I have reviewed the triage vital signs and the nursing notes.  Pertinent labs & imaging results that were available during my care of the patient were reviewed by me and considered in my medical decision making (see chart for details).     Patient with abdominal pain post colonoscopy.  Lab work reassuring.  CT scan done and reassuring.  Will start some Bentyl for short course.  Follow-up with Dr. Laural Golden.  Final Clinical Impressions(s) / ED Diagnoses   Final diagnoses:  Abdominal pain, unspecified abdominal location    ED Discharge Orders        Ordered    dicyclomine (BENTYL) 10 MG capsule  3 times daily PRN     10/30/17 2245       Davonna Belling, MD 10/30/17 2313

## 2017-10-30 NOTE — Telephone Encounter (Signed)
Patient has called multiple times. She is in pain. Hurting, burning and states that it hurts her to walk. Other symptoms are cramping , bloating and feeling raw. Patient had a Colonoscopy on Friday of last week.  She would like to talk with a provider and would appreciate a call back. [phone].

## 2017-10-30 NOTE — Telephone Encounter (Signed)
Patient had procedure on 10-25-17 - still feeling very uncomfortable - cant sit or stand - please call with advice - ph# [phone]

## 2017-10-30 NOTE — Telephone Encounter (Signed)
This is being sent to Deberah Castle, NP as she is the provider in the office.

## 2017-10-30 NOTE — Telephone Encounter (Signed)
See previous note

## 2017-10-31 ENCOUNTER — Ambulatory Visit (INDEPENDENT_AMBULATORY_CARE_PROVIDER_SITE_OTHER): Payer: Medicare Other | Admitting: Internal Medicine

## 2017-10-31 ENCOUNTER — Encounter (INDEPENDENT_AMBULATORY_CARE_PROVIDER_SITE_OTHER): Payer: Self-pay | Admitting: Internal Medicine

## 2017-10-31 VITALS — BP 120/82 | HR 64 | Temp 97.7°F | Ht 65.0 in | Wt 128.3 lb

## 2017-10-31 DIAGNOSIS — R1030 Lower abdominal pain, unspecified: Secondary | ICD-10-CM

## 2017-10-31 MED ORDER — DICYCLOMINE HCL 10 MG PO CAPS: 10.0000 mg | ORAL_CAPSULE | Freq: Three times a day (TID) | ORAL | 1 refills | Status: DC

## 2017-10-31 NOTE — Progress Notes (Signed)
   Subjective:    Patient ID: Danielle Byrd, female    DOB: 1956-01-15, 62 y.o.   MRN: [id number]  HPI Presents today with c/o abdominal cramping, bloating. She underwent a colonoscopy 10/25/2017 for hx of colonic polyps. One polyp removed which was a tubular adenoma.  On colonoscopy significant looping and scope was replaced with a Baby scope. ,  Has been taking Simethicone. Seen in the ED yesterday (I advised her if abdominal pain worsened to go). She underwent a CT scan which was normal. No free air or intraperitoneal free fluid.  (Dr. Laural Golden is aware).  Given an Rx for Dicyclomine as needed. She says she is not feeling any better. Still having some cramps.  She says she has been having BMs which are loose, more like diarrhea.  She still has some cramps.  No fever. She is taking the dicyclomine tid as needed. Nausea a couple of times but no vomiting.    CBC    Component Value Date/Time   WBC 3.3 (L) 10/30/2017 2013   RBC 3.21 (L) 10/30/2017 2013   HGB 11.0 (L) 10/30/2017 2013   HCT 32.5 (L) 10/30/2017 2013   PLT 169 10/30/2017 2013   MCV 101.2 (H) 10/30/2017 2013   MCH 34.3 (H) 10/30/2017 2013   MCHC 33.8 10/30/2017 2013   RDW 12.2 10/30/2017 2013   LYMPHSABS 2.0 10/30/2017 2013   MONOABS 0.3 10/30/2017 2013   EOSABS 0.1 10/30/2017 2013   BASOSABS 0.0 10/30/2017 2013   CMP Latest Ref Rng & Units 10/30/2017 10/21/2017 03/01/2017  Glucose 70 - 99 mg/dL 111(H) 94 89  BUN 8 - 23 mg/dL 8 10 12   Creatinine 0.44 - 1.00 mg/dL 0.73 0.77 0.88  Sodium 135 - 145 mmol/L 139 137 140  Potassium 3.5 - 5.1 mmol/L 4.0 3.8 4.4  Chloride 98 - 111 mmol/L 103 102 101  CO2 22 - 32 mmol/L 30 28 24   Calcium 8.9 - 10.3 mg/dL 9.0 8.8(L) 9.3  Total Protein 6.5 - 8.1 g/dL 6.4(L) - -  Total Bilirubin 0.3 - 1.2 mg/dL 0.4 - -  Alkaline Phos 38 - 126 U/L 72 - -  AST 15 - 41 U/L 23 - -  ALT 0 - 44 U/L 20 - -   Lipase     Component Value Date/Time   LIPASE 23 10/30/2017 2013     Review of Systems     Objective:   Physical Exam Blood pressure 120/82, pulse 64, temperature 97.7 F (36.5 C), height 5\' 5"  (1.651 m), weight 128 lb 4.8 oz (58.2 kg). Alert and oriented. Skin warm and dry. Oral mucosa is moist.   . Sclera anicteric, conjunctivae is pink. Thyroid not enlarged. No cervical lymphadenopathy. Lungs clear. Heart regular rate and rhythm.  Abdomen is soft. Bowel sounds are positive. No hepatomegaly. No abdominal masses felt. No tenderness.  No edema to lower extremities.           Assessment & Plan:  Abdominal pain post colonoscopy.  Continue the Dicyclomine. May take stool softener as needed.  Take a stool softener.

## 2017-10-31 NOTE — Patient Instructions (Addendum)
Continue the Dicyclomine.  Refill on her Dicyclomine sent to her pharmacy.

## 2017-11-01 ENCOUNTER — Other Ambulatory Visit (INDEPENDENT_AMBULATORY_CARE_PROVIDER_SITE_OTHER): Payer: Self-pay | Admitting: Internal Medicine

## 2017-11-01 ENCOUNTER — Encounter (INDEPENDENT_AMBULATORY_CARE_PROVIDER_SITE_OTHER): Payer: Self-pay | Admitting: Internal Medicine

## 2017-11-01 MED ORDER — OXYCODONE-ACETAMINOPHEN 5-325 MG PO TABS: 1.0000 | ORAL_TABLET | Freq: Four times a day (QID) | ORAL | 0 refills | Status: DC | PRN

## 2017-11-01 NOTE — Progress Notes (Signed)
I asked patient to stop by the hospital so I could examine her abdomen. He had colonoscopy last week.  She had small tubular adenoma removed. Continue to complain of pain across lower abdomen.  She feels like she has 20 pounds of break in there.  She was seen in emergency room few days ago and lab studies and CT were unremarkable. He also has had diarrhea.  She was on doxycycline for about 3 months but stopped sometime in June.  She denies rectal bleeding dysuria or vaginal discharge. Abdominal exam reveals normal bowel sounds symmetrical abdomen is soft.  She has mild generalized tenderness.  No guarding organomegaly.  Cause of patient's symptoms is not clear unless her IBS has been flared up due to procedure. She is not getting better with dicyclomine and simethicone. We will treat her symptomatically with Percocet 5/325 4 times daily as needed for 1 week. If symptoms persist she may need further evaluation.

## 2017-11-05 ENCOUNTER — Telehealth (INDEPENDENT_AMBULATORY_CARE_PROVIDER_SITE_OTHER): Payer: Self-pay | Admitting: *Deleted

## 2017-11-05 ENCOUNTER — Other Ambulatory Visit (INDEPENDENT_AMBULATORY_CARE_PROVIDER_SITE_OTHER): Payer: Self-pay | Admitting: Internal Medicine

## 2017-11-05 MED ORDER — TRAMADOL HCL 50 MG PO TABS: 50.0000 mg | ORAL_TABLET | Freq: Three times a day (TID) | ORAL | 0 refills | Status: DC | PRN

## 2017-11-05 NOTE — Progress Notes (Signed)
Talk with patient few minutes ago.  She states she developed itching after taking first dose of Percocet.  She had taken this medication in the past and did not have similar reaction. Patient states she has taken tramadol in the past and has not experienced any side effects. Her symptoms/pain has not changed.  She is not febrile.  She does not have nausea vomiting diarrhea or rectal bleeding. I suspect her pain is multifactorial i.e. IBS and fibromyalgia.  She had abdominal pelvic CT the last week in the emergency room and no abnormality was noted. We will treat her with tramadol 50 mg p.o. 3 times daily as needed for another week and see how she does.  Prescription sent to her pharmacy for 42 doses.

## 2017-11-05 NOTE — Telephone Encounter (Signed)
Patient left a message this morning stating that she is no better. Still has discomfort when walking , riding , sitting that it is very uncomfortable. It does help when walking if she holds her stomach. She feels abdominal pressure and swelling. She also states that she has a mouth ulcer. She cannot take Oxycodone,(Perocet) it makes her itch all over. This has been documented in the patient's chart.  She ask if Dr.Rehman can help to please give her a call back. [phone].  Patient was called and a message was left letting her know that we had got the message and Dr.Rehman would be made aware.

## 2017-11-05 NOTE — Telephone Encounter (Signed)
Dr.Rehman has talked with the patient . He is sending a prescription for the Tramadol 50 mg - Take 1 po TID prn for pain. #42. He also addressed the fever blister, patient states that she has been doctoring on it and it is better.

## 2017-11-09 ENCOUNTER — Telehealth (INDEPENDENT_AMBULATORY_CARE_PROVIDER_SITE_OTHER): Payer: Self-pay | Admitting: Internal Medicine

## 2017-11-09 ENCOUNTER — Other Ambulatory Visit (INDEPENDENT_AMBULATORY_CARE_PROVIDER_SITE_OTHER): Payer: Self-pay | Admitting: Internal Medicine

## 2017-11-09 NOTE — Telephone Encounter (Signed)
I called the patient for a progress report. She says today has been the best day.  She has been using heating pad and also pool therapy has helped a great deal. She took tramadol twice and developed headache and she therefore discontinued.  She states headache was severe. Allergy list updated.

## 2017-11-23 ENCOUNTER — Other Ambulatory Visit (INDEPENDENT_AMBULATORY_CARE_PROVIDER_SITE_OTHER): Payer: Self-pay | Admitting: Internal Medicine

## 2017-11-23 DIAGNOSIS — R1030 Lower abdominal pain, unspecified: Secondary | ICD-10-CM

## 2017-12-04 NOTE — Progress Notes (Signed)
Office Visit Note  Patient: Danielle Byrd             Date of Birth: 07/24/1955           MRN: [id number]             PCP: Allie Dimmer, MD Referring: Allie Dimmer, MD Visit Date: 12/18/2017 Occupation: _0 @  Subjective:  Generalized pain   History of Present Illness: TALAYSIA PINHEIRO is a 62 y.o. female with history of fibromyalgia, sicca syndrome, DDD, and osteoarthritis.  She reports she continues to have generalized muscle aches and muscle tenderness due to fibromyalgia.  She is having increased tension in the trapezius muscles bilaterally. She reports increased tender points. She had trigger point injections at her last visit which provided significant pain relief.  She states that she continues to have chronic pain in her right knee joint but denies any joint swelling.  She continues to have chronic neck pain and stiffness.  She denies any symptoms of radiculopathy.  She continues to have interrupted sleep at night.  She takes trazodone at bedtime.  She reports that her fatigue level has been stable.  She states that she has quit line dancing but continues to go to water aerobics 3 times a week.  She states that she has been trying to do exercises and stay active at home.    She continues to have sicca symptoms.  She uses Systane balance eyedrops on a regular basis as well as Biotene products.    Activities of Daily Living:  Patient reports morning stiffness for 4-5  hours.   Patient Reports nocturnal pain.  Difficulty dressing/grooming: Reports Difficulty climbing stairs: Reports Difficulty getting out of chair: Reports Difficulty using hands for taps, buttons, cutlery, and/or writing: Reports  Review of Systems  Constitutional: Positive for fatigue.  HENT: Positive for mouth dryness. Negative for mouth sores and nose dryness.   Eyes: Positive for dryness. Negative for pain and visual disturbance.  Respiratory: Negative for cough, hemoptysis, shortness of breath  and difficulty breathing.   Cardiovascular: Positive for swelling in legs/feet. Negative for chest pain, palpitations and hypertension.  Gastrointestinal: Positive for constipation. Negative for blood in stool and diarrhea.  Endocrine: Negative for increased urination.  Genitourinary: Negative for difficulty urinating and painful urination.  Musculoskeletal: Positive for arthralgias, joint pain, joint swelling, myalgias, muscle weakness, morning stiffness, muscle tenderness and myalgias.  Skin: Negative for color change, pallor, rash, hair loss, nodules/bumps, skin tightness, ulcers and sensitivity to sunlight.  Allergic/Immunologic: Negative for susceptible to infections.  Neurological: Negative for numbness.  Hematological: Negative for swollen glands.  Psychiatric/Behavioral: Positive for depressed mood and sleep disturbance. The patient is not nervous/anxious.     PMFS History:  Patient Active Problem List   Diagnosis Date Noted  . Hx of colonic polyps 09/05/2017  . Family hx of colon cancer 09/05/2017  . Migraine without aura and without status migrainosus, not intractable 03/04/2017  . Fibromyalgia 11/22/2016  . Other fatigue 11/22/2016  . DDD (degenerative disc disease), cervical 11/22/2016  . Primary osteoarthritis of both hands 11/22/2016  . History of migraine 11/22/2016  . History of anxiety 11/22/2016  . History of hypothyroidism 11/22/2016  . History of kidney stones 11/22/2016  . History of cholecystectomy 11/22/2016  . History of anemia 11/22/2016  . Hx of bone density study/ normal 2014 this is followed by her Primary Care  11/22/2016  . Sicca syndrome ([place]) 11/22/2016  . 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 Headache(784.0) 10/27/2013  .  Leukopenia 07/08/2013  . Anemia 10/31/2011  . Iron deficiency 10/31/2011  . GERD (gastroesophageal reflux disease) 05/14/2011  . Abdominal pain, bilateral upper quadrant 05/14/2011  . H/O fibromyalgia 05/14/2011  . Constipation  05/14/2011  . Hyperlipemia 05/14/2011  . Kidney stones 01/11/2011    Past Medical History:  Diagnosis Date  . Anemia   . Anxiety   . Arthritis   . Cervical stenosis (uterine cervix)   . Closed fracture of left foot   . Constipation   . Copper deficiency   . Depression   . Fibromyalgia   . GERD (gastroesophageal reflux disease)   . High cholesterol   . History of kidney stones   . Hyperlipidemia   . Hypothyroidism   . Kidney stones   . Lumbar stenosis 11/27/2016  . Melanosis coli   . Migraines   . Nephrolithiasis   . Panic attack   . Rosacea   . Sliding hiatal hernia   . Spondylosis, cervical     Family History  Problem Relation Age of Onset  . Diabetes Mother   . Hypertension Mother   . Anemia Mother   . Colon cancer Father   . Cancer Father 40       colon cancer  . Heart attack Father    Past Surgical History:  Procedure Laterality Date  . ABDOMINAL HYSTERECTOMY    . BONE MARROW BIOPSY     & aspirate  . BREAST SURGERY Right    partial mastectomy  . CHOLECYSTECTOMY    . COLONOSCOPY N/A 07/22/2012   Procedure: COLONOSCOPY;  Surgeon: [name], MD;  [facility]: [facility];  Service: Endoscopy;  Laterality: N/A;  730  . COLONOSCOPY WITH PROPOFOL N/A 10/25/2017   Procedure: COLONOSCOPY WITH PROPOFOL;  Surgeon: [name], MD;  Location: [facility];  Service: Endoscopy;  Laterality: N/A;  10:30  . DILATION AND CURETTAGE OF UTERUS    . EYE SURGERY     sty removal and reconstruction of eye lids.  Marland Kitchen NISSEN FUNDOPLICATION    . POLYPECTOMY  10/25/2017   Procedure: POLYPECTOMY;  Surgeon: [name], MD;  Location: [facility];  Service: Endoscopy;;  sigmoid    Social History   Social History Narrative   Patient lives at home with chacha (boxer, therapy dog)   Patient right handed   Patient has a BS degree.   Patient drinks 2 cups of tea daily.    Objective: Vital Signs: BP 128/79 (BP Location: Left Arm, Patient Position: Sitting, Cuff  Size: Normal)   Pulse 67   Ht 5' 5.5" (1.664 m)   Wt 130 lb 9.6 oz (59.2 kg)   BMI 21.40 kg/m    Physical Exam  Constitutional: She is oriented to person, place, and time. She appears well-developed and well-nourished.  HENT:  Head: Normocephalic and atraumatic.  Eyes: Conjunctivae and EOM are normal.  Neck: Normal range of motion.  Cardiovascular: Normal rate, regular rhythm, normal heart sounds and intact distal pulses.  Pulmonary/Chest: Effort normal and breath sounds normal.  Abdominal: Soft. Bowel sounds are normal.  Lymphadenopathy:    She has no cervical adenopathy.  Neurological: She is alert and oriented to person, place, and time.  Skin: Skin is warm and dry. Capillary refill takes less than 2 seconds.  Psychiatric: She has a normal mood and affect. Her behavior is normal.  Nursing note and vitals reviewed.    Musculoskeletal Exam: C-spine limited ROM. Thoracic and lumbar spine good ROM.  No  midline spinal tenderness.  No SI joint tenderness.  Ischial bursitis bilaterally.  Shoulder joints elbow joints, wrist joints, MCPs, PIPs, DIPs good range of motion no synovitis.  She has complete fist formation bilaterally. Bilateral CMC joint synovial thickening. Hip joints, knee joints, ankle joints, MTPs, PIPs, DIPs good range of motion with no synovitis.  No warmth or effusion bilateral knee joints.  She has right knee crepitus.  No tenderness of bilateral ankle joints.  No tenderness of MTP joints.  CDAI Exam: CDAI Score: Not documented Patient Global Assessment: Not documented; Provider Global Assessment: Not documented Swollen: Not documented; Tender: Not documented Joint Exam   Not documented   There is currently no information documented on the homunculus. Go to the Rheumatology activity and complete the homunculus joint exam.  Investigation: No additional findings.  Imaging: No results found.  Recent Labs: Lab Results  Component Value Date   WBC 3.3 (L)  10/30/2017   HGB 11.0 (L) 10/30/2017   PLT 169 10/30/2017   NA 139 10/30/2017   K 4.0 10/30/2017   CL 103 10/30/2017   CO2 30 10/30/2017   GLUCOSE 111 (H) 10/30/2017   BUN 8 10/30/2017   CREATININE 0.73 10/30/2017   BILITOT 0.4 10/30/2017   ALKPHOS 72 10/30/2017   AST 23 10/30/2017   ALT 20 10/30/2017   PROT 6.4 (L) 10/30/2017   ALBUMIN 3.9 10/30/2017   CALCIUM 9.0 10/30/2017   GFRAA >60 10/30/2017    Speciality Comments: No specialty comments available.  Procedures:  Trigger Point Inj Date/Time: 12/18/2017 2:03 PM Performed by: Ofilia Neas, PA-C Authorized by: Ofilia Neas, PA-C   Consent Given by:  Patient Site marked: the procedure site was marked   Timeout: prior to procedure the correct patient, procedure, and site was verified   Indications:  Pain Total # of Trigger Points:  2 Location: neck   Needle Size:  27 G Approach:  Dorsal Medications #1:  0.5 mL lidocaine 1 %; 10 mg triamcinolone acetonide 40 MG/ML Medications #2:  0.5 mL lidocaine 1 %; 10 mg triamcinolone acetonide 40 MG/ML Patient tolerance:  Patient tolerated the procedure well with no immediate complications   Allergies: Oxycodone; Reglan [metoclopramide]; Tramadol hcl; Codeine; Cymbalta [duloxetine hcl]; Eggs or egg-derived products; Gabapentin; Hydrocodone-acetaminophen; Penicillins; Savella [milnacipran hcl]; Statins; Voltaren [diclofenac sodium]; and Lyrica [pregabalin]   Assessment / Plan:     Visit Diagnoses: Fibromyalgia: She has generalized hyperalgesia on exam.  She continues to have generalized muscle aches and muscle tenderness.  She continues to have chronic fatigue and insomnia.  She was encouraged to stay active and exercise on a regular basis.  She continues to go to water aerobics 3 times weekly and performs stretching at home.  She had tenderness and tension of bilateral trapezius muscles.  She requested bilateral trapezius muscle trigger point injections.  She tolerated the procedure  well.    Other fatigue: Chronic and related to insomnia.  She was encouraged to stay active and exercise on a regular basis.    Primary insomnia: She takes Trazodone at bedtime.    Sicca syndrome, unspecified ([place]): She continues to have sicca symptoms.  Her ophthalmologist recommended that she try Systane balance eyedrops which she has been using on a regular basis.  She is also been using Biotene products to help with mouth dryness.  She is no parotid swelling on exam today.  Primary osteoarthritis of both hands: She has bilateral CMC joint synovial thickening.  She is complete fist formation bilaterally.  She has no discomfort in her hands at this time.  Joint protection and muscle strengthening were discussed.  DDD (degenerative disc disease), cervical: She has limited range of motion of her C-spine.  She has no symptoms of radiculopathy at this time.  Ischial bursitis of left side: She has tenderness of the left ischial tuberosity. She will start performing stretching exercises at home.  She was given a prescription for a ischial tuberosity cushion.  She was encouraged to change positions frequently.   Ischial bursitis, right: She has tenderness on exam today.  She was given a handout of exercises that she can perform at home.  She was also given a prescription for an ischial tuberosity cushion.  Trapezius muscle spasm: She has muscle tension and muscle tenderness in bilateral trapezius muscles.  She requested bilateral trigger point injections.  She tolerated the procedure well. Potential side effects were discussed.  She will continue using a heating pad and massage.   Other medical conditions are listed as follows:  History of migraine  Other neutropenia ([place])  History of anxiety  History of hypothyroidism  History of kidney stones  History of cholecystectomy  History of anemia   Orders: Orders Placed This Encounter  Procedures  . Trigger Point Inj   No orders of the  defined types were placed in this encounter.   Face-to-face time spent with patient was 30 minutes. Greater than 50% of time was spent in counseling and coordination of care.  Follow-Up Instructions: Return in about 6 months (around 06/18/2018) for Fibromyalgia, Osteoarthritis, DDD.   Ofilia Neas, PA-C  Note - This record has been created using Dragon software.  Chart creation errors have been sought, but may not always  have been located. Such creation errors do not reflect on  the standard of medical care.

## 2017-12-12 DIAGNOSIS — Z5181 Encounter for therapeutic drug level monitoring: Secondary | ICD-10-CM | POA: Diagnosis not present

## 2017-12-18 ENCOUNTER — Encounter: Payer: Self-pay | Admitting: Rheumatology

## 2017-12-18 ENCOUNTER — Ambulatory Visit (INDEPENDENT_AMBULATORY_CARE_PROVIDER_SITE_OTHER): Payer: Medicare Other | Admitting: Rheumatology

## 2017-12-18 VITALS — BP 128/79 | HR 67 | Ht 65.5 in | Wt 130.6 lb

## 2017-12-18 DIAGNOSIS — M797 Fibromyalgia: Secondary | ICD-10-CM | POA: Diagnosis not present

## 2017-12-18 DIAGNOSIS — M19041 Primary osteoarthritis, right hand: Secondary | ICD-10-CM

## 2017-12-18 DIAGNOSIS — M7071 Other bursitis of hip, right hip: Secondary | ICD-10-CM

## 2017-12-18 DIAGNOSIS — M5030 Other cervical disc degeneration, unspecified cervical region: Secondary | ICD-10-CM | POA: Diagnosis not present

## 2017-12-18 DIAGNOSIS — M3500 Sicca syndrome, unspecified: Secondary | ICD-10-CM | POA: Diagnosis not present

## 2017-12-18 DIAGNOSIS — M62838 Other muscle spasm: Secondary | ICD-10-CM | POA: Diagnosis not present

## 2017-12-18 DIAGNOSIS — M19042 Primary osteoarthritis, left hand: Secondary | ICD-10-CM

## 2017-12-18 DIAGNOSIS — M7072 Other bursitis of hip, left hip: Secondary | ICD-10-CM | POA: Diagnosis not present

## 2017-12-18 DIAGNOSIS — Z8669 Personal history of other diseases of the nervous system and sense organs: Secondary | ICD-10-CM

## 2017-12-18 DIAGNOSIS — R5383 Other fatigue: Secondary | ICD-10-CM | POA: Diagnosis not present

## 2017-12-18 DIAGNOSIS — Z8639 Personal history of other endocrine, nutritional and metabolic disease: Secondary | ICD-10-CM

## 2017-12-18 DIAGNOSIS — F5101 Primary insomnia: Secondary | ICD-10-CM | POA: Diagnosis not present

## 2017-12-18 DIAGNOSIS — Z9049 Acquired absence of other specified parts of digestive tract: Secondary | ICD-10-CM

## 2017-12-18 DIAGNOSIS — D708 Other neutropenia: Secondary | ICD-10-CM | POA: Diagnosis not present

## 2017-12-18 DIAGNOSIS — Z862 Personal history of diseases of the blood and blood-forming organs and certain disorders involving the immune mechanism: Secondary | ICD-10-CM

## 2017-12-18 DIAGNOSIS — Z87442 Personal history of urinary calculi: Secondary | ICD-10-CM

## 2017-12-18 DIAGNOSIS — Z8659 Personal history of other mental and behavioral disorders: Secondary | ICD-10-CM | POA: Diagnosis not present

## 2017-12-18 MED ORDER — LIDOCAINE HCL 1 % IJ SOLN
0.5000 mL | INTRAMUSCULAR | Status: AC | PRN
  Administered 2017-12-18: .5 mL

## 2017-12-18 MED ORDER — TRIAMCINOLONE ACETONIDE 40 MG/ML IJ SUSP
10.0000 mg | INTRAMUSCULAR | Status: AC | PRN
  Administered 2017-12-18: 10 mg via INTRAMUSCULAR

## 2017-12-18 NOTE — Patient Instructions (Signed)
Iliotibial Band Syndrome Rehab  Ask your health care provider which exercises are safe for you. Do exercises exactly as told by your health care provider and adjust them as directed. It is normal to feel mild stretching, pulling, tightness, or discomfort as you do these exercises, but you should stop right away if you feel sudden pain or your pain gets worse. Do not begin these exercises until told by your health care provider.  Stretching and range of motion exercises  These exercises warm up your muscles and joints and improve the movement and flexibility of your hip and pelvis.  Exercise A: Quadriceps, prone    1. Lie on your abdomen on a firm surface, such as a bed or padded floor.  2. Bend your left / right knee and hold your ankle. If you cannot reach your ankle or pant leg, loop a belt around your foot and grab the belt instead.  3. Gently pull your heel toward your buttocks. Your knee should not slide out to the side. You should feel a stretch in the front of your thigh and knee.  4. Hold this position for __________ seconds.  Repeat __________ times. Complete this stretch __________ times a day.  Exercise B: Iliotibial band    1. Lie on your side with your left / right leg in the top position.  2. Bend both of your knees and grab your left / right ankle. Stretch out your bottom arm to help you balance.  3. Slowly bring your top knee back so your thigh goes behind your trunk.  4. Slowly lower your top leg toward the floor until you feel a gentle stretch on the outside of your left / right hip and thigh. If you do not feel a stretch and your knee will not fall farther, place the heel of your other foot on top of your knee and pull your knee down toward the floor with your foot.  5. Hold this position for __________ seconds.  Repeat __________ times. Complete this stretch __________ times a day.  Strengthening exercises  These exercises build strength and endurance in your hip and pelvis. Endurance is the  ability to use your muscles for a long time, even after they get tired.  Exercise C: Straight leg raises (  hip abductors)  1. Lie on your side with your left / right leg in the top position. Lie so your head, shoulder, knee, and hip line up. You may bend your bottom knee to help you balance.  2. Roll your hips slightly forward so your hips are stacked directly over each other and your left / right knee is facing forward.  3. Tense the muscles in your outer thigh and lift your top leg 4-6 inches (10-15 cm).  4. Hold this position for __________ seconds.  5. Slowly return to the starting position. Let your muscles relax completely before doing another repetition.  Repeat __________ times. Complete this exercise __________ times a day.  Exercise D: Straight leg raises (  hip extensors)  1. Lie on your abdomen on your bed or a firm surface. You can put a pillow under your hips if that is more comfortable.  2. Bend your left / right knee so your foot is straight up in the air.  3. Squeeze your buttock muscles and lift your left / right thigh off the bed. Do not let your back arch.  4. Tense this muscle as hard as you can without increasing any knee pain.    5. Hold this position for __________ seconds.  6. Slowly lower your leg to the starting position and allow it to relax completely.  Repeat __________ times. Complete this exercise __________ times a day.  Exercise E: Hip hike  1. Stand sideways on a bottom step. Stand on your left / right leg with your other foot unsupported next to the step. You can hold onto the railing or wall if needed for balance.  2. Keep your knees straight and your torso square. Then, lift your left / right hip up toward the ceiling.  3. Slowly let your left / right hip lower toward the floor, past the starting position. Your foot should get closer to the floor. Do not lean or bend your knees.  Repeat __________ times. Complete this exercise __________ times a day.  This information is not  intended to replace advice given to you by your health care provider. Make sure you discuss any questions you have with your health care provider.  Document Released: 03/19/2005 Document Revised: 11/22/2015 Document Reviewed: 02/18/2015  [employer] Interactive Patient Education © 2018 [employer]

## 2018-02-12 DIAGNOSIS — H52223 Regular astigmatism, bilateral: Secondary | ICD-10-CM | POA: Diagnosis not present

## 2018-02-12 DIAGNOSIS — H25093 Other age-related incipient cataract, bilateral: Secondary | ICD-10-CM | POA: Diagnosis not present

## 2018-02-12 DIAGNOSIS — H43813 Vitreous degeneration, bilateral: Secondary | ICD-10-CM | POA: Diagnosis not present

## 2018-02-12 DIAGNOSIS — H02889 Meibomian gland dysfunction of unspecified eye, unspecified eyelid: Secondary | ICD-10-CM | POA: Diagnosis not present

## 2018-02-12 DIAGNOSIS — H5203 Hypermetropia, bilateral: Secondary | ICD-10-CM | POA: Diagnosis not present

## 2018-02-12 DIAGNOSIS — H04123 Dry eye syndrome of bilateral lacrimal glands: Secondary | ICD-10-CM | POA: Diagnosis not present

## 2018-02-12 DIAGNOSIS — H524 Presbyopia: Secondary | ICD-10-CM | POA: Diagnosis not present

## 2018-04-23 DIAGNOSIS — H9203 Otalgia, bilateral: Secondary | ICD-10-CM | POA: Diagnosis not present

## 2018-05-07 DIAGNOSIS — E039 Hypothyroidism, unspecified: Secondary | ICD-10-CM | POA: Diagnosis not present

## 2018-05-07 DIAGNOSIS — D510 Vitamin B12 deficiency anemia due to intrinsic factor deficiency: Secondary | ICD-10-CM | POA: Diagnosis not present

## 2018-05-07 DIAGNOSIS — E559 Vitamin D deficiency, unspecified: Secondary | ICD-10-CM | POA: Diagnosis not present

## 2018-05-07 DIAGNOSIS — E782 Mixed hyperlipidemia: Secondary | ICD-10-CM | POA: Diagnosis not present

## 2018-05-07 DIAGNOSIS — D500 Iron deficiency anemia secondary to blood loss (chronic): Secondary | ICD-10-CM | POA: Diagnosis not present

## 2018-05-12 DIAGNOSIS — Z0000 Encounter for general adult medical examination without abnormal findings: Secondary | ICD-10-CM | POA: Diagnosis not present

## 2018-05-12 DIAGNOSIS — Z79899 Other long term (current) drug therapy: Secondary | ICD-10-CM | POA: Diagnosis not present

## 2018-06-04 NOTE — Progress Notes (Signed)
Office Visit Note  Patient: Danielle Byrd             Date of Birth: 04-Jul-1955           MRN: [id number]             PCP: Allie Dimmer, MD Referring: Allie Dimmer, MD Visit Date: 06/18/2018 Occupation: '@[email or web address]' @  Subjective:  Trapezius muscle spasms   History of Present Illness: Danielle Byrd is a 63 y.o. female with history of fibromyalgia, DDD, and osteoarthritis. She is prescribed Robaxin 500 mg TID PRN for muscle spasms.  She takes Trazodone 50 mg 1 tablet po PRN for insomnia.  She continues have generalized muscle aches and muscle tenderness due to fibromyalgia.  She has bilateral history of bursitis and uses occasion when she is sitting for prolonged periods of time.  She presents today with bilateral trapezius muscle tension and muscle spasms.  She requested bilateral cortisone injections today. She has been having increased lower back pain. She reports that her level of pain has been more severe which causes her fatigue to worsen as well.  She is been having some difficulty falling asleep at night due to the pain.  She denies any joint swelling.  She continues to have chronic sicca symptoms.  Activities of Daily Living:  Patient reports morning stiffness for 3 hours.   Patient Reports nocturnal pain.  Difficulty dressing/grooming: Denies Difficulty climbing stairs: Reports Difficulty getting out of chair: Reports Difficulty using hands for taps, buttons, cutlery, and/or writing: Reports  Review of Systems  Constitutional: Positive for fatigue.  HENT: Positive for mouth dryness. Negative for mouth sores and nose dryness.   Eyes: Positive for dryness. Negative for pain and visual disturbance.  Respiratory: Negative for cough, hemoptysis, shortness of breath and difficulty breathing.   Cardiovascular: Negative for chest pain, palpitations, hypertension and swelling in legs/feet.  Gastrointestinal: Positive for constipation. Negative for blood in stool and diarrhea.    Endocrine: Negative for increased urination.  Genitourinary: Negative for painful urination.  Musculoskeletal: Positive for arthralgias, joint pain, myalgias, morning stiffness, muscle tenderness and myalgias. Negative for joint swelling and muscle weakness.  Skin: Negative for color change, pallor, rash, hair loss, nodules/bumps, skin tightness, ulcers and sensitivity to sunlight.  Allergic/Immunologic: Negative for susceptible to infections.  Neurological: Negative for dizziness, numbness, headaches and weakness.  Hematological: Negative for swollen glands.  Psychiatric/Behavioral: Positive for depressed mood and sleep disturbance. The patient is nervous/anxious.     PMFS History:  Patient Active Problem List   Diagnosis Date Noted  . Hx of colonic polyps 09/05/2017  . Family hx of colon cancer 09/05/2017  . Migraine without aura and without status migrainosus, not intractable 03/04/2017  . Fibromyalgia 11/22/2016  . Other fatigue 11/22/2016  . DDD (degenerative disc disease), cervical 11/22/2016  . Primary osteoarthritis of both hands 11/22/2016  . History of migraine 11/22/2016  . History of anxiety 11/22/2016  . History of hypothyroidism 11/22/2016  . History of kidney stones 11/22/2016  . History of cholecystectomy 11/22/2016  . History of anemia 11/22/2016  . Hx of bone density study/ normal 2014 this is followed by her Primary Care  11/22/2016  . Sicca syndrome ([place]) 11/22/2016  . 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 Headache(784.0) 10/27/2013  . Leukopenia 07/08/2013  . Anemia 10/31/2011  . Iron deficiency 10/31/2011  . GERD (gastroesophageal reflux disease) 05/14/2011  . Abdominal pain, bilateral upper quadrant 05/14/2011  . H/O fibromyalgia 05/14/2011  . Constipation 05/14/2011  . Hyperlipemia 05/14/2011  .  Kidney stones 01/11/2011    Past Medical History:  Diagnosis Date  . Anemia   . Anxiety   . Arthritis   . Cervical stenosis (uterine cervix)   . Closed  fracture of left foot   . Constipation   . Copper deficiency   . Depression   . Fibromyalgia   . GERD (gastroesophageal reflux disease)   . High cholesterol   . History of kidney stones   . Hyperlipidemia   . Hypothyroidism   . Kidney stones   . Lumbar stenosis 11/27/2016  . Melanosis coli   . Migraines   . Nephrolithiasis   . Panic attack   . Rosacea   . Sliding hiatal hernia   . Spondylosis, cervical     Family History  Problem Relation Age of Onset  . Diabetes Mother   . Hypertension Mother   . Anemia Mother   . Colon cancer Father   . Cancer Father 48       colon cancer  . Heart attack Father    Past Surgical History:  Procedure Laterality Date  . ABDOMINAL HYSTERECTOMY    . BONE MARROW BIOPSY     & aspirate  . BREAST SURGERY Right    partial mastectomy  . CHOLECYSTECTOMY    . COLONOSCOPY N/A 07/22/2012   Procedure: COLONOSCOPY;  Surgeon: [name], MD;  [facility];  Service: Endoscopy;  Laterality: N/A;  730  . COLONOSCOPY WITH PROPOFOL N/A 10/25/2017   Procedure: COLONOSCOPY WITH PROPOFOL;  Surgeon: [name], MD;  Location: [facility];  Service: Endoscopy;  Laterality: N/A;  10:30  . DILATION AND CURETTAGE OF UTERUS    . EYE SURGERY     sty removal and reconstruction of eye lids.  Marland Kitchen NISSEN FUNDOPLICATION    . POLYPECTOMY  10/25/2017   Procedure: POLYPECTOMY;  Surgeon: [name], MD;  [facility]: [facility];  Service: Endoscopy;;  sigmoid    Social History   Social History Narrative   Patient lives at home with chacha (boxer, therapy dog)   Patient right handed   Patient has a BS degree.   Patient drinks 2 cups of tea daily.    There is no immunization history on file for this patient.   Objective: Vital Signs: BP 101/65 (BP Location: Left Arm, Patient Position: Sitting, Cuff Size: Small)   Pulse 73   Resp 12   Ht '5\' 5"'  (1.651 m)   Wt 122 lb 12.8 oz (55.7 kg)   BMI 20.43 kg/m    Physical Exam Vitals signs  and nursing note reviewed.  Constitutional:      Appearance: She is well-developed.  HENT:     Head: Normocephalic and atraumatic.  Eyes:     Conjunctiva/sclera: Conjunctivae normal.  Neck:     Musculoskeletal: Normal range of motion.  Cardiovascular:     Rate and Rhythm: Normal rate and regular rhythm.     Heart sounds: Normal heart sounds.  Pulmonary:     Effort: Pulmonary effort is normal.     Breath sounds: Normal breath sounds.  Abdominal:     General: Bowel sounds are normal.     Palpations: Abdomen is soft.  Lymphadenopathy:     Cervical: No cervical adenopathy.  Skin:    General: Skin is warm and dry.     Capillary Refill: Capillary refill takes less than 2 seconds.  Neurological:     Mental Status: She is alert and oriented to person, place,  and time.  Psychiatric:        Behavior: Behavior normal.      Musculoskeletal Exam: C-spine limited range of motion with some discomfort.  Limited range of motion and discomfort of lumbar spine.  Shoulder joints, elbow joints and wrist joints, MCPs and PIPs and DIPs good range of motion with no synovitis.  She is PIP and DIP synovial thickening consistent with osteoarthritis of bilateral hands.  She has complete fist formation bilaterally.  Hip joints, knee joints, ankle joints, MTPs and PIPs and DIPs good range of motion no synovitis.  No warmth or effusion bilateral knees.  She has bilateral knee crepitus.  No tenderness or swelling of ankle joints.  Tenderness over bilateral trochanteric bursa and bilateral ischial bursa.  CDAI Exam: CDAI Score: Not documented Patient Global Assessment: Not documented; Provider Global Assessment: Not documented Swollen: Not documented; Tender: Not documented Joint Exam   Not documented   There is currently no information documented on the homunculus. Go to the Rheumatology activity and complete the homunculus joint exam.  Investigation: No additional findings.  Imaging: No results  found.  Recent Labs: Lab Results  Component Value Date   WBC 3.3 (L) 10/30/2017   HGB 11.0 (L) 10/30/2017   PLT 169 10/30/2017   NA 139 10/30/2017   K 4.0 10/30/2017   CL 103 10/30/2017   CO2 30 10/30/2017   GLUCOSE 111 (H) 10/30/2017   BUN 8 10/30/2017   CREATININE 0.73 10/30/2017   BILITOT 0.4 10/30/2017   ALKPHOS 72 10/30/2017   AST 23 10/30/2017   ALT 20 10/30/2017   PROT 6.4 (L) 10/30/2017   ALBUMIN 3.9 10/30/2017   CALCIUM 9.0 10/30/2017   GFRAA >60 10/30/2017    Speciality Comments: No specialty comments available.  Procedures:  Trigger Point Inj Date/Time: 06/18/2018 1:18 PM Performed by: Ofilia Neas, PA-C Authorized by: Ofilia Neas, PA-C   Consent Given by:  Patient Site marked: the procedure site was marked   Timeout: prior to procedure the correct patient, procedure, and site was verified   Indications:  Pain Total # of Trigger Points:  2 Location: neck   Needle Size:  27 G Approach:  Dorsal Medications #1:  0.5 mL lidocaine 1 %; 10 mg triamcinolone acetonide 40 MG/ML Medications #2:  0.5 mL lidocaine 1 %; 10 mg triamcinolone acetonide 40 MG/ML Patient tolerance:  Patient tolerated the procedure well with no immediate complications   Allergies: Oxycodone; Reglan [metoclopramide]; Tramadol hcl; Codeine; Cymbalta [duloxetine hcl]; Eggs or egg-derived products; Gabapentin; Hydrocodone-acetaminophen; Penicillins; Savella [milnacipran hcl]; Statins; Voltaren [diclofenac sodium]; Zanaflex [tizanidine hcl]; and Lyrica [pregabalin]   Assessment / Plan:     Visit Diagnoses: Fibromyalgia: She has generalized hyperalgesia and positive tender points.  She has generalized muscle aches and muscle tenderness.  She presents today with trapezius muscle spasms and muscle tension.  She requested trigger point injections bilaterally.  She tolerated the procedure well.  She has chronic ischial bursitis bilaterally.  She was encouraged to stay active and exercise on a  regular basis.  She has chronic fatigue related to insomnia.  She has been having difficulty falling asleep due to the pain she has been experiencing.  She takes trazodone 50 mg 1 tablet po PRN for insomnia.  Good sleep hygiene was discussed.  She will follow up in 6 months.   Other fatigue: Chronic and related to insomnia.   Primary insomnia - She takes Trazodone 50 mg 1 tablet po PRN for insomnia.  She  has been having increased difficulty falling asleep due to the pain she has been experiencing.   Sicca syndrome ([place]): She has chronic sicca symptoms.   Primary osteoarthritis of both hands: She has PIP and DIP synovial thickening consistent with osteoarthritis of both hands.  No synovitis or tenderness.  Complete fist formation bilaterally.  Joint protection and muscle strengthening were discussed.   DDD (degenerative disc disease), cervical: She has limited ROM with lateral rotation.  She has no symptoms of radiculopathy at this time.  She has trapezius muscle spasms bilaterally.    Ischial bursitis of left side:Chronic.  She has tenderness on exam.    Ischial bursitis, right: Chronic. She has tenderness on exam.  She uses a cushion if she is sitting for prolonged periods of time.   Trapezius muscle spasm: She has trapezius muscle spasms and tension bilaterally.  She has been having more frequent muscle spasms.  She takes Robaxin 500 mg TID PRN for muscle spasms.  She requested bilateral trigger point injections.  She tolerated the procedures well.  Procedure notes completed above.   Chronic midline low back pain without sciatica: She has midline spinal tenderness but no symptoms of radiculopathy at this time.  She has been having increased muscle tenderness in her lower back.  She declined a X-ray at this time.  She was given a handout of back exercises and core strengthening exercises.  If her pain persists we will obtain a X-ray and possibly refer her to PT in the future.   Other medical  conditions are listed as follows:   History of migraine  Other neutropenia ([place])  History of anxiety  History of hypothyroidism  History of kidney stones  History of anemia  History of cholecystectomy   Orders: Orders Placed This Encounter  Procedures  . Trigger Point Inj   No orders of the defined types were placed in this encounter.   Face-to-face time spent with patient was 30 minutes. Greater than 50% of time was spent in counseling and coordination of care.  Follow-Up Instructions: Return in about 6 months (around 12/19/2018) for Fibromyalgia.   Ofilia Neas, PA-C  Note - This record has been created using Dragon software.  Chart creation errors have been sought, but may not always  have been located. Such creation errors do not reflect on  the standard of medical care.

## 2018-06-18 ENCOUNTER — Encounter: Payer: Self-pay | Admitting: Physician Assistant

## 2018-06-18 ENCOUNTER — Other Ambulatory Visit: Payer: Self-pay

## 2018-06-18 ENCOUNTER — Ambulatory Visit (INDEPENDENT_AMBULATORY_CARE_PROVIDER_SITE_OTHER): Payer: Medicare Other | Admitting: Physician Assistant

## 2018-06-18 VITALS — BP 101/65 | HR 73 | Resp 12 | Ht 65.0 in | Wt 122.8 lb

## 2018-06-18 DIAGNOSIS — M62838 Other muscle spasm: Secondary | ICD-10-CM | POA: Diagnosis not present

## 2018-06-18 DIAGNOSIS — R5383 Other fatigue: Secondary | ICD-10-CM

## 2018-06-18 DIAGNOSIS — Z8659 Personal history of other mental and behavioral disorders: Secondary | ICD-10-CM

## 2018-06-18 DIAGNOSIS — M180 Bilateral primary osteoarthritis of first carpometacarpal joints: Secondary | ICD-10-CM

## 2018-06-18 DIAGNOSIS — D708 Other neutropenia: Secondary | ICD-10-CM | POA: Diagnosis not present

## 2018-06-18 DIAGNOSIS — G8929 Other chronic pain: Secondary | ICD-10-CM

## 2018-06-18 DIAGNOSIS — F5101 Primary insomnia: Secondary | ICD-10-CM

## 2018-06-18 DIAGNOSIS — M7072 Other bursitis of hip, left hip: Secondary | ICD-10-CM

## 2018-06-18 DIAGNOSIS — Z8669 Personal history of other diseases of the nervous system and sense organs: Secondary | ICD-10-CM | POA: Diagnosis not present

## 2018-06-18 DIAGNOSIS — M3500 Sicca syndrome, unspecified: Secondary | ICD-10-CM | POA: Diagnosis not present

## 2018-06-18 DIAGNOSIS — M19042 Primary osteoarthritis, left hand: Secondary | ICD-10-CM

## 2018-06-18 DIAGNOSIS — M797 Fibromyalgia: Secondary | ICD-10-CM

## 2018-06-18 DIAGNOSIS — M19041 Primary osteoarthritis, right hand: Secondary | ICD-10-CM

## 2018-06-18 DIAGNOSIS — M5030 Other cervical disc degeneration, unspecified cervical region: Secondary | ICD-10-CM | POA: Diagnosis not present

## 2018-06-18 DIAGNOSIS — Z9049 Acquired absence of other specified parts of digestive tract: Secondary | ICD-10-CM

## 2018-06-18 DIAGNOSIS — Z87442 Personal history of urinary calculi: Secondary | ICD-10-CM

## 2018-06-18 DIAGNOSIS — Z862 Personal history of diseases of the blood and blood-forming organs and certain disorders involving the immune mechanism: Secondary | ICD-10-CM

## 2018-06-18 DIAGNOSIS — M7071 Other bursitis of hip, right hip: Secondary | ICD-10-CM

## 2018-06-18 DIAGNOSIS — Z8639 Personal history of other endocrine, nutritional and metabolic disease: Secondary | ICD-10-CM

## 2018-06-18 DIAGNOSIS — M545 Low back pain: Secondary | ICD-10-CM

## 2018-06-18 MED ORDER — TRIAMCINOLONE ACETONIDE 40 MG/ML IJ SUSP
10.0000 mg | INTRAMUSCULAR | Status: AC | PRN
  Administered 2018-06-18: 10 mg via INTRAMUSCULAR

## 2018-06-18 MED ORDER — LIDOCAINE HCL 1 % IJ SOLN
0.5000 mL | INTRAMUSCULAR | Status: AC | PRN
  Administered 2018-06-18: .5 mL

## 2018-06-18 NOTE — Patient Instructions (Signed)
Core Strength Exercises  Core exercises help to build strength in the muscles between your ribs and your hips (abdominal muscles). These muscles help to support your body and keep your spine stable. It is important to maintain strength in your core to prevent injury and pain. Some activities, such as yoga and Pilates, can help to strengthen core muscles. You can also strengthen core muscles with exercises at home. It is important to talk to your health care provider before you start a new exercise routine. What are the benefits of core strength exercises? Core strength exercises can:  Reduce back pain.  Help to rebuild strength after a back or spine injury.  Help to prevent injury during physical activity, especially injuries to the back and knees. How to do core strength exercises Repeat these exercises 10-15 times, or until you are tired. Do exercises exactly as told by your health care provider and adjust them as directed. It is normal to feel mild stretching, pulling, tightness, or discomfort as you do these exercises. If you feel any pain while doing these exercises, stop. If your pain continues or gets worse when doing core exercises, contact your health care provider. You may want to use a padded yoga or exercise mat for strength exercises that are done on the floor. Bridging  1. Lie on your back on a firm surface with your knees bent and your feet flat on the floor. 2. Raise your hips so that your knees, hips, and shoulders form a straight line together. Keep your abdominal muscles tight. 3. Hold this position for 3-5 seconds. 4. Slowly lower your hips to the starting position. 5. Let your muscles relax completely between repetitions. Single-leg bridge 1. Lie on your back on a firm surface with your knees bent and your feet flat on the floor. 2. Raise your hips so that your knees, hips, and shoulders form a straight line together. Keep your abdominal muscles tight. 3. Lift one foot  off the floor, then completely straighten that leg. 4. Hold this position for 3-5 seconds. 5. Put the straight leg back down in the bent position. 6. Slowly lower your hips to the starting position. 7. Repeat these steps using your other leg. Side bridge 1. Lie on your side with your knees bent. Prop yourself up on the elbow that is near the floor. 2. Using your abdominal muscles and your elbow that is on the floor, raise your body off the floor. Raise your hip so that your shoulder, hip, and foot form a straight line together. 3. Hold this position for 10 seconds. Keep your head and neck raised and away from your shoulder (in their normal, neutral position). Keep your abdominal muscles tight. 4. Slowly lower your hip to the starting position. 5. Repeat and try to hold this position longer, working your way up to 30 seconds. Abdominal crunch 1. Lie on your back on a firm surface. Bend your knees and keep your feet flat on the floor. 2. Cross your arms over your chest. 3. Without bending your neck, tip your chin slightly toward your chest. 4. Tighten your abdominal muscles as you lift your chest just high enough to lift your shoulder blades off of the floor. Do not hold your breath. You can do this with short lifts or long lifts. 5. Slowly return to the starting position. Bird dog 1. Get on your hands and knees, with your legs shoulder-width apart and your arms under your shoulders. Keep your back straight. 2. Tighten  your abdominal muscles. 3. Raise one of your legs off the floor and straighten it. Try to keep it parallel to the floor. 4. Slowly lower your leg to the starting position. 5. Raise one of your arms off the floor and straighten it. Try to keep it parallel to the floor. 6. Slowly lower your arm to the starting position. 7. Repeat with the other arm and leg. If possible, try raising a leg and arm at the same time, on opposite sides of the body. For example, raise your left hand and  your right leg. Plank 1. Lie on your belly. 2. Prop up your body onto your forearms and your feet, keeping your legs straight. Your body should make a straight line between your shoulders and feet. 3. Hold this position for 10 seconds while keeping your abdominal muscles tight. 4. Lower your body to the starting position. 5. Repeat and try to hold this position longer, working your way up to 30 seconds. Cross-core strengthening 1. Stand with your feet shoulder-width apart. 2. Hold a ball out in front of you. Keep your arms straight. 3. Tighten your abdominal muscles and slowly rotate at your waist from side to side. Keep your feet flat. 4. Once you are comfortable, try repeating this exercise with a heavier ball. Top core strengthening 1. Stand about 18 inches (46 cm) in front of a wall, with your back to the wall. 2. Keep your feet flat and shoulder-width apart. 3. Tighten your abdominal muscles. 4. Bend your hips and knees. 5. Slowly reach between your legs to touch the wall behind you. 6. Slowly stand back up. 7. Raise your arms over your head and reach behind you. 8. Return to the starting position. General tips  Do not do any exercises that cause pain. If you have pain while exercising, talk to your health care provider.  Always stretch before and after doing these exercises. This can help prevent injury.  Maintain a healthy weight. Ask your health care provider what weight is healthy for you. Contact a health care provider if:  You have back pain that gets worse or does not go away.  You feel pain while doing core strength exercises. Get help right away if:  You have severe pain that does not get better with medicine. Summary  Core exercises help to build strength in the muscles between your ribs and your waist.  Core muscles help to support your body and keep your spine stable.  Some activities, such as yoga and Pilates, can help to strengthen core muscles.  Core  strength exercises can help back pain and can prevent injury.  If you feel any pain while doing core strength exercises, stop. This information is not intended to replace advice given to you by your health care provider. Make sure you discuss any questions you have with your health care provider. Document Released: 08/08/2016 Document Revised: 08/08/2016 Document Reviewed: 08/08/2016 Elsevier Interactive Patient Education  2019 [employer] Back Exercises If you have pain in your back, do these exercises 2-3 times each day or as told by your doctor. When the pain goes away, do the exercises once each day, but repeat the steps more times for each exercise (do more repetitions). If you do not have pain in your back, do these exercises once each day or as told by your doctor. Exercises Single Knee to Chest Do these steps 3-5 times in a row for each leg: 1. Lie on your back on a firm bed   or the floor with your legs stretched out. 2. Bring one knee to your chest. 3. Hold your knee to your chest by grabbing your knee or thigh. 4. Pull on your knee until you feel a gentle stretch in your lower back. 5. Keep doing the stretch for 10-30 seconds. 6. Slowly let go of your leg and straighten it. Pelvic Tilt Do these steps 5-10 times in a row: 6. Lie on your back on a firm bed or the floor with your legs stretched out. 7. Bend your knees so they point up to the ceiling. Your feet should be flat on the floor. 8. Tighten your lower belly (abdomen) muscles to press your lower back against the floor. This will make your tailbone point up to the ceiling instead of pointing down to your feet or the floor. 9. Stay in this position for 5-10 seconds while you gently tighten your muscles and breathe evenly. Cat-Cow Do these steps until your lower back bends more easily: 8. Get on your hands and knees on a firm surface. Keep your hands under your shoulders, and keep your knees under your hips. You may put padding  under your knees. 9. Let your head hang down, and make your tailbone point down to the floor so your lower back is round like the back of a cat. 10. Stay in this position for 5 seconds. 11. Slowly lift your head and make your tailbone point up to the ceiling so your back hangs low (sags) like the back of a cow. 12. Stay in this position for 5 seconds.  Press-Ups Do these steps 5-10 times in a row: 6. Lie on your belly (face-down) on the floor. 7. Place your hands near your head, about shoulder-width apart. 8. While you keep your back relaxed and keep your hips on the floor, slowly straighten your arms to raise the top half of your body and lift your shoulders. Do not use your back muscles. To make yourself more comfortable, you may change where you place your hands. 9. Stay in this position for 5 seconds. 10. Slowly return to lying flat on the floor.  Bridges Do these steps 10 times in a row: 6. Lie on your back on a firm surface. 7. Bend your knees so they point up to the ceiling. Your feet should be flat on the floor. 8. Tighten your butt muscles and lift your butt off of the floor until your waist is almost as high as your knees. If you do not feel the muscles working in your butt and the back of your thighs, slide your feet 1-2 inches farther away from your butt. 9. Stay in this position for 3-5 seconds. 10. Slowly lower your butt to the floor, and let your butt muscles relax. If this exercise is too easy, try doing it with your arms crossed over your chest. Belly Crunches Do these steps 5-10 times in a row: 8. Lie on your back on a firm bed or the floor with your legs stretched out. 9. Bend your knees so they point up to the ceiling. Your feet should be flat on the floor. 10. Cross your arms over your chest. 11. Tip your chin a little bit toward your chest but do not bend your neck. 12. Tighten your belly muscles and slowly raise your chest just enough to lift your shoulder blades a  tiny bit off of the floor. 13. Slowly lower your chest and your head to the floor. Back Lifts Do   these steps 5-10 times in a row: 6. Lie on your belly (face-down) with your arms at your sides, and rest your forehead on the floor. 7. Tighten the muscles in your legs and your butt. 8. Slowly lift your chest off of the floor while you keep your hips on the floor. Keep the back of your head in line with the curve in your back. Look at the floor while you do this. 9. Stay in this position for 3-5 seconds. 10. Slowly lower your chest and your face to the floor. Contact a doctor if:  Your back pain gets a lot worse when you do an exercise.  Your back pain does not lessen 2 hours after you exercise. If you have any of these problems, stop doing the exercises. Do not do them again unless your doctor says it is okay. Get help right away if:  You have sudden, very bad back pain. If this happens, stop doing the exercises. Do not do them again unless your doctor says it is okay. This information is not intended to replace advice given to you by your health care provider. Make sure you discuss any questions you have with your health care provider. Document Released: 04/21/2010 Document Revised: 12/11/2017 Document Reviewed: 05/13/2014 Elsevier Interactive Patient Education  2019 [employer]  

## 2018-08-21 DIAGNOSIS — S7012XA Contusion of left thigh, initial encounter: Secondary | ICD-10-CM | POA: Diagnosis not present

## 2018-08-21 DIAGNOSIS — M5442 Lumbago with sciatica, left side: Secondary | ICD-10-CM | POA: Diagnosis not present

## 2018-08-22 DIAGNOSIS — S70312A Abrasion, left thigh, initial encounter: Secondary | ICD-10-CM | POA: Diagnosis not present

## 2018-08-22 DIAGNOSIS — W19XXXD Unspecified fall, subsequent encounter: Secondary | ICD-10-CM | POA: Diagnosis not present

## 2018-08-22 DIAGNOSIS — S7012XD Contusion of left thigh, subsequent encounter: Secondary | ICD-10-CM | POA: Diagnosis not present

## 2018-08-22 DIAGNOSIS — M545 Low back pain: Secondary | ICD-10-CM | POA: Diagnosis not present

## 2018-09-07 IMAGING — CT CT ABD-PELV W/ CM
2 of 5 series · 16 of 46 positions shown, 18 images · IV contrast (Isovue)
Comparison: None.

CLINICAL DATA: Severe abdominal cramping and bloating after
colonoscopy.

EXAM:
CT ABDOMEN AND PELVIS WITH CONTRAST
TECHNIQUE: Multidetector CT imaging of the abdomen and pelvis was performed
using the standard protocol following bolus administration of
intravenous contrast.
CONTRAST:  100mL [id number]-[id number] IOPAMIDOL ([id number]-[id number]) INJECTION 61%

[Series 2: axial st · axial · 0.70mm/px · z∈[+1005,+1385]mm · 13 of 86 slices shown, 15 images]
[im 5/86  soft-tissue]
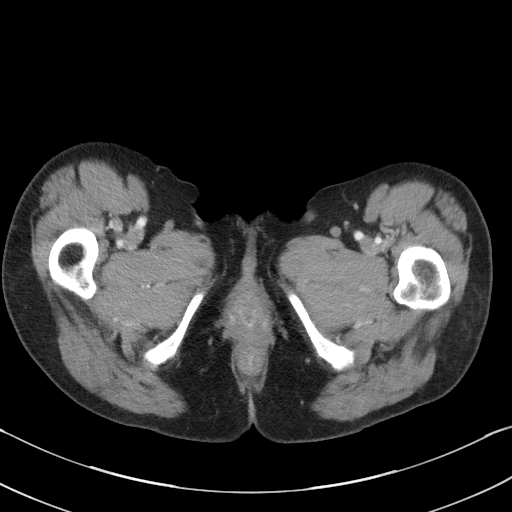
[im 5/86  bone]
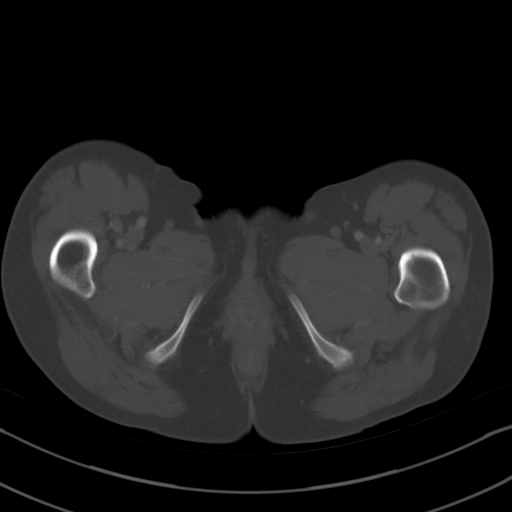
[im 14/86  soft-tissue]
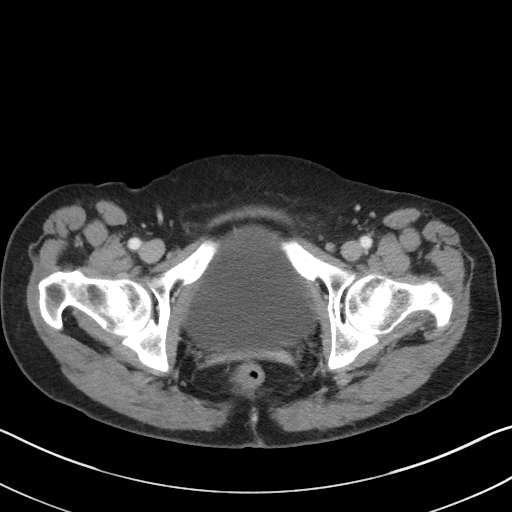
[im 18/86  soft-tissue]
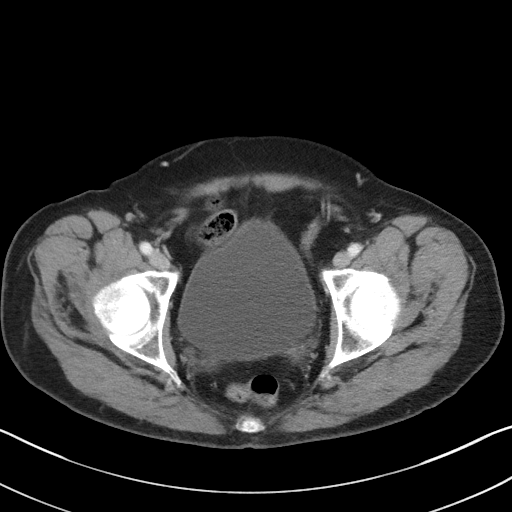
[im 23/86  soft-tissue]
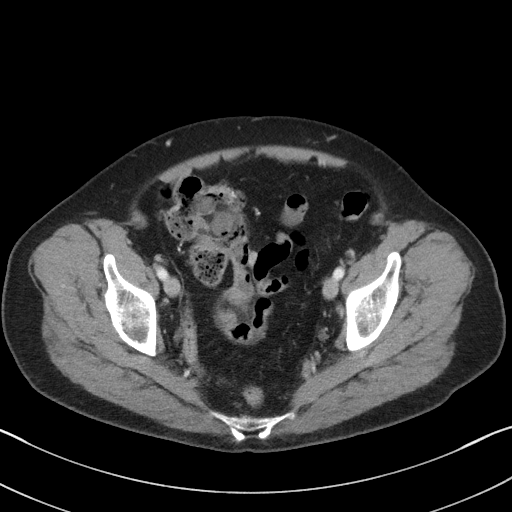
[im 32/86  soft-tissue]
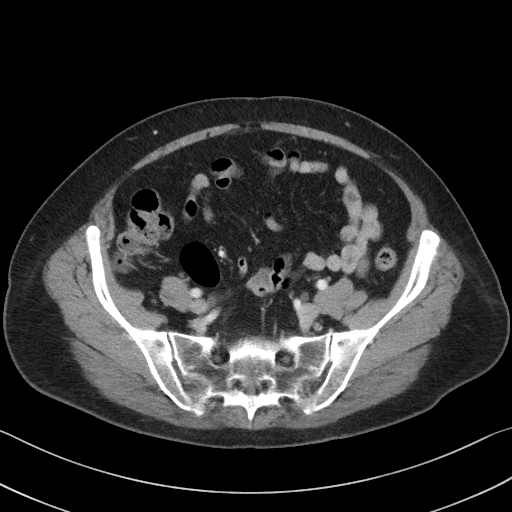
[im 36/86  soft-tissue]
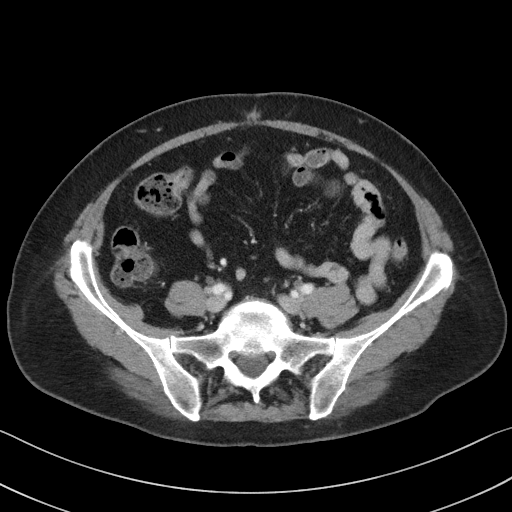
[im 45/86  soft-tissue]
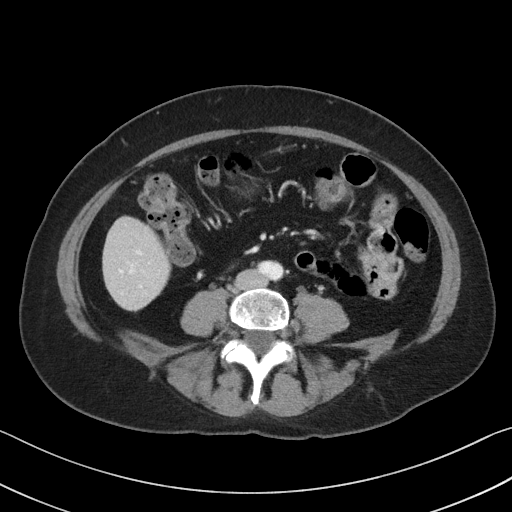
[im 50/86  soft-tissue]
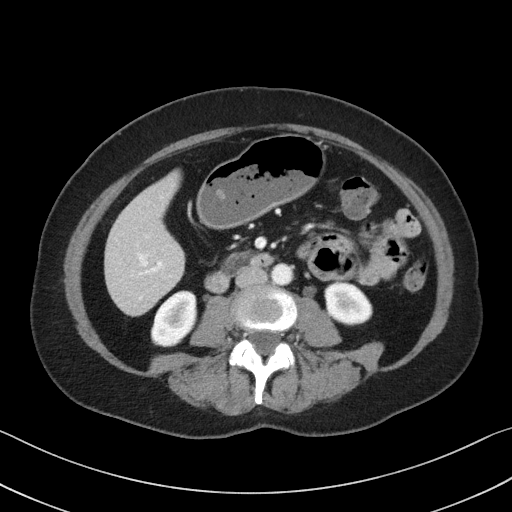
[im 54/86  soft-tissue]
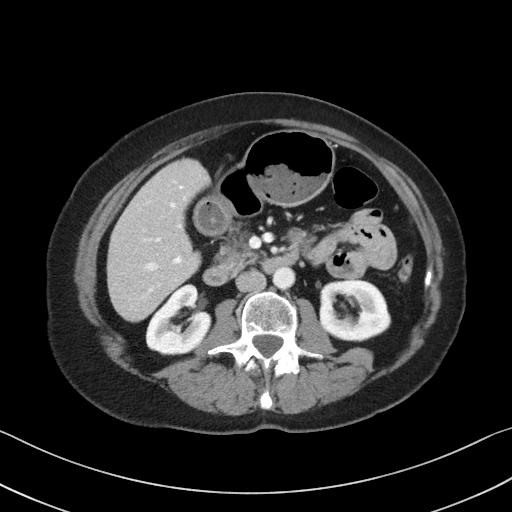
[im 54/86  bone]
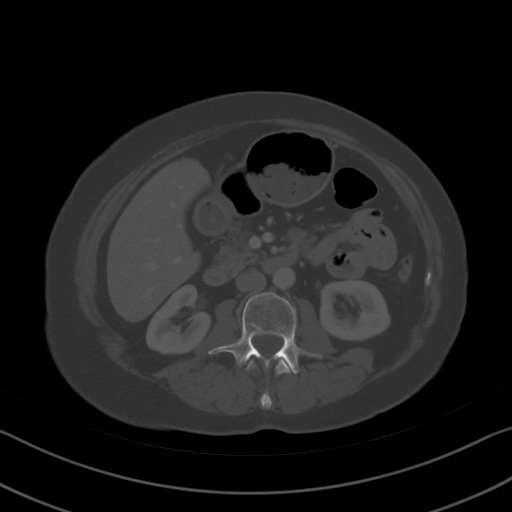
[im 63/86  soft-tissue]
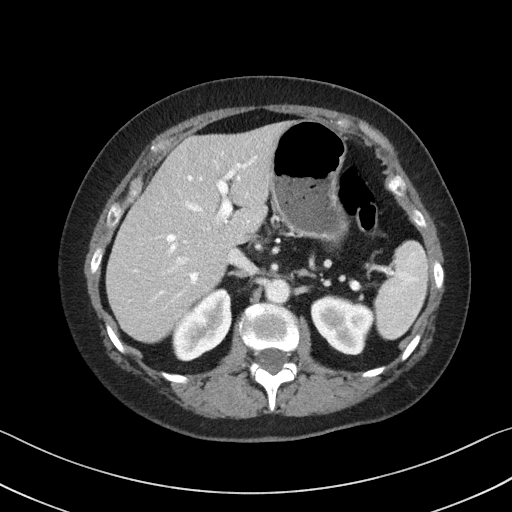
[im 68/86  soft-tissue]
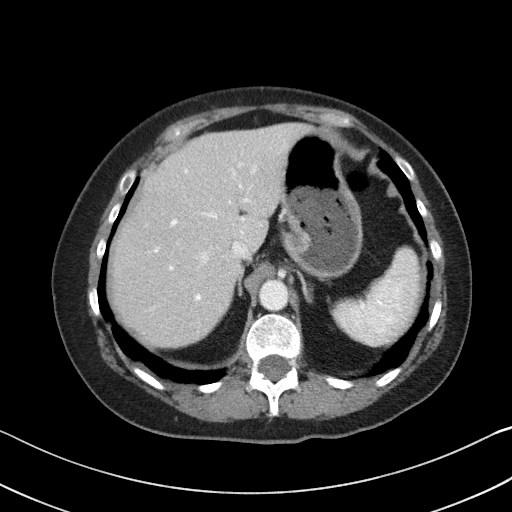
[im 72/86  soft-tissue]
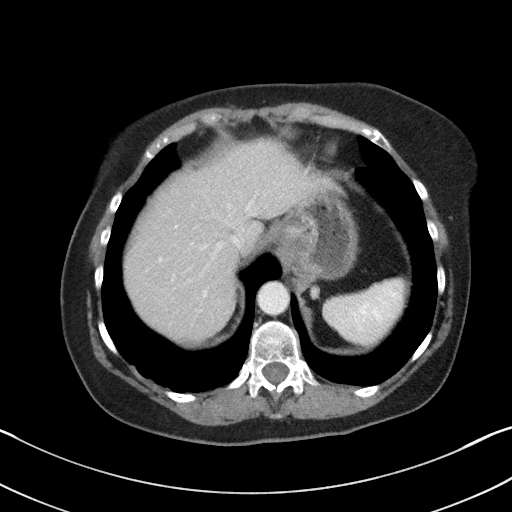
[im 81/86  soft-tissue]
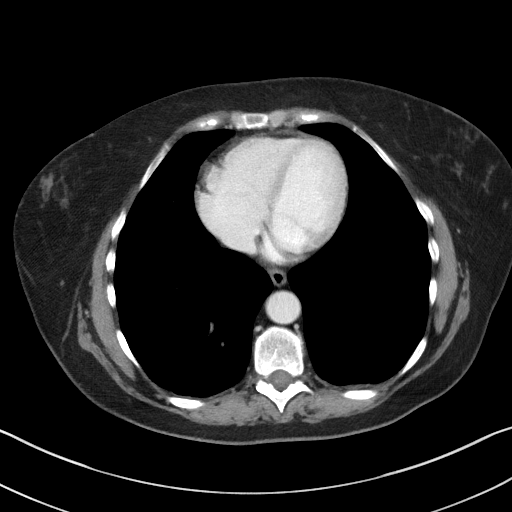

[Series 5: coronal st · coronal · 0.71mm/px · 3 of 98 slices shown]
[im 33/98  soft-tissue]
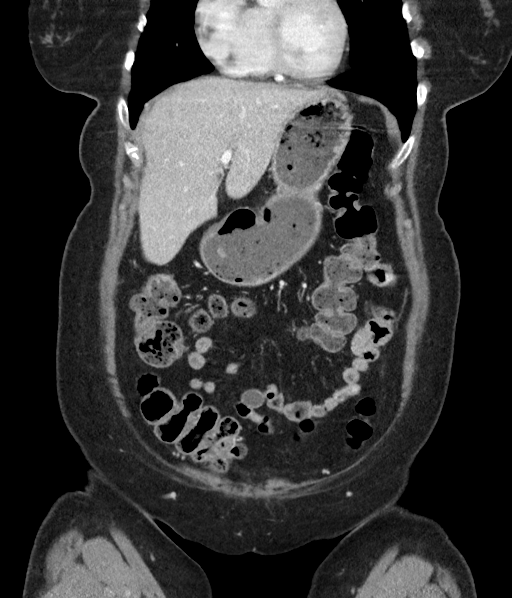
[im 44/98  soft-tissue]
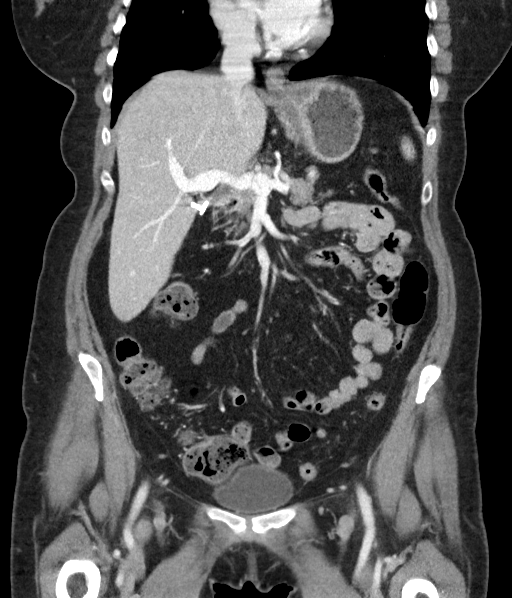
[im 54/98  soft-tissue]
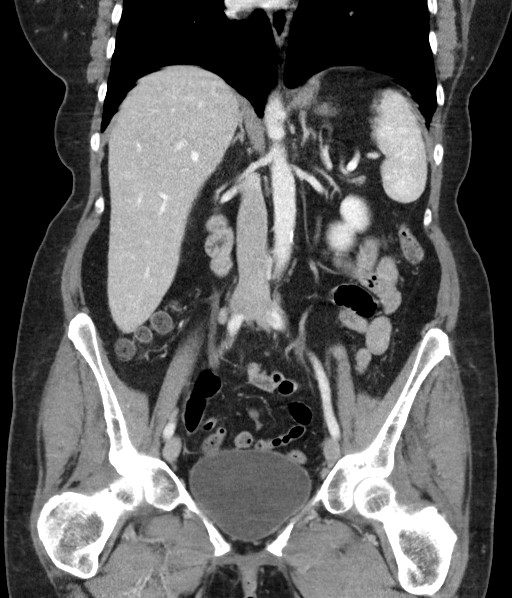

[16 of 46 positions shown; findings below may reference images not displayed]

FINDINGS: Lower chest: Unremarkable.

Hepatobiliary: No focal abnormality within the liver parenchyma.
Gallbladder surgically absent. No intrahepatic or extrahepatic
biliary dilation.

Pancreas: No focal mass lesion. No dilatation of the main duct. No
intraparenchymal cyst. No peripancreatic edema.

Spleen: No splenomegaly. No focal mass lesion.

Adrenals/Urinary Tract: No adrenal nodule or mass. Right kidney
unremarkable. Several tiny nonobstructing stones identified in the
left kidney. No evidence for hydroureter. The urinary bladder
appears normal for the degree of distention.

Stomach/Bowel: Deformity in the proximal stomach is compatible with
prior fundoplication. Stomach is nondistended. No gastric wall
thickening. No evidence of outlet obstruction. Duodenum is normally
positioned as is the ligament of Treitz. No small bowel wall
thickening. No small bowel dilatation. The terminal ileum is normal.
The appendix is normal. No gross colonic mass. No colonic wall
thickening. No substantial diverticular change.

Vascular/Lymphatic: There is abdominal aortic atherosclerosis
without aneurysm. There is no gastrohepatic or hepatoduodenal
ligament lymphadenopathy. No intraperitoneal or retroperitoneal
lymphadenopathy. No pelvic sidewall lymphadenopathy.

Reproductive: Uterus surgically absent.  There is no adnexal mass.

Other: No intraperitoneal free fluid.  No intraperitoneal free air.

Musculoskeletal: No worrisome lytic or sclerotic osseous
abnormality.
IMPRESSION: 1. No acute findings in the abdomen or pelvis. Specifically, no
evidence for colonic wall thickening. No intraperitoneal free air or
intraperitoneal free fluid.
2. Nonobstructing left renal stones.

## 2018-10-28 NOTE — Progress Notes (Signed)
Office Visit Note  Patient: Danielle Byrd             Date of Birth: 1955/09/20           MRN: [id number]             PCP: Allie Dimmer, MD Referring: Allie Dimmer, MD Visit Date: 10/30/2018 Occupation: '@GUAROCC' @  Subjective:  Generalized pain   History of Present Illness: Danielle Byrd is a 63 y.o. female with sister-in-law fibromyalgia, osteoarthritis, and DDD.  She reports that she had a fall on 08/22/2018 and continues to have discomfort.  She states that she fell from an 8 foot ladder and landed on her left side.  She continues to have a hematoma in the lateral aspect of the left thigh.  She reports that she is having worsening ischio bursitis bilaterally.  She states that she is been having more frequent fibromyalgia flares since the fall.  She continues to have chronic lower back pain.  She is having trapezius muscle tension and muscle tenderness.  She states that her neck is doing well overall.  She is been having increased pain in both hands but denies any joint swelling.  She continues to have nocturnal pain which causes interrupted sleep at night.  Her fatigue is related to insomnia.  She states she was previously going to the pool twice weekly but has been unable to due to COVID-19.  She continues to have chronic sicca symptoms.  She uses Systane eyedrops and ointment at night to help with eye dryness.   Activities of Daily Living:  Patient reports morning stiffness for 90 minutes.   Patient Reports nocturnal pain.  Difficulty dressing/grooming: Reports Difficulty climbing stairs: Reports Difficulty getting out of chair: Reports Difficulty using hands for taps, buttons, cutlery, and/or writing: Reports  Review of Systems  Constitutional: Positive for fatigue.  HENT: Positive for mouth dryness and nose dryness. Negative for mouth sores.   Eyes: Positive for dryness. Negative for pain, itching and visual disturbance.  Respiratory: Negative for cough, hemoptysis,  shortness of breath, wheezing and difficulty breathing.   Cardiovascular: Negative for chest pain, palpitations, hypertension and swelling in legs/feet.  Gastrointestinal: Negative for abdominal pain, blood in stool, constipation and diarrhea.  Endocrine: Negative for increased urination.  Genitourinary: Negative for painful urination.  Musculoskeletal: Positive for arthralgias, joint pain, joint swelling and morning stiffness. Negative for myalgias, muscle weakness, muscle tenderness and myalgias.  Skin: Negative for color change, pallor, rash, hair loss, nodules/bumps, redness, skin tightness, ulcers and sensitivity to sunlight.  Allergic/Immunologic: Negative for susceptible to infections.  Neurological: Negative for numbness and memory loss.  Hematological: Negative for swollen glands.  Psychiatric/Behavioral: Positive for sleep disturbance. Negative for depressed mood and confusion. The patient is not nervous/anxious.     PMFS History:  Patient Active Problem List   Diagnosis Date Noted   Hx of colonic polyps 09/05/2017   Family hx of colon cancer 09/05/2017   Migraine without aura and without status migrainosus, not intractable 03/04/2017   Fibromyalgia 11/22/2016   Other fatigue 11/22/2016   DDD (degenerative disc disease), cervical 11/22/2016   Primary osteoarthritis of both hands 11/22/2016   History of migraine 11/22/2016   History of anxiety 11/22/2016   History of hypothyroidism 11/22/2016   History of kidney stones 11/22/2016   History of cholecystectomy 11/22/2016   History of anemia 11/22/2016   Hx of bone density study/ normal 2014 this is followed by her Primary Care  11/22/2016   Sicca  syndrome ([place]) 11/22/2016   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   Headache(784.0) 10/27/2013   Leukopenia 07/08/2013   Anemia 10/31/2011   Iron deficiency 10/31/2011   GERD (gastroesophageal reflux disease) 05/14/2011   Abdominal pain, bilateral upper quadrant  05/14/2011   H/O fibromyalgia 05/14/2011   Constipation 05/14/2011   Hyperlipemia 05/14/2011   Kidney stones 01/11/2011    Past Medical History:  Diagnosis Date   Anemia    Anxiety    Arthritis    Cervical stenosis (uterine cervix)    Closed fracture of left foot    Constipation    Copper deficiency    Depression    Fibromyalgia    GERD (gastroesophageal reflux disease)    High cholesterol    History of kidney stones    Hyperlipidemia    Hypothyroidism    Kidney stones    Lumbar stenosis 11/27/2016   Melanosis coli    Migraines    Nephrolithiasis    Panic attack    Rosacea    Sliding hiatal hernia    Spondylosis, cervical     Family History  Problem Relation Age of Onset   Diabetes Mother    Hypertension Mother    Anemia Mother    Colon cancer Father    Cancer Father 25       colon cancer   Heart attack Father    Past Surgical History:  Procedure Laterality Date   ABDOMINAL HYSTERECTOMY     BONE MARROW BIOPSY     & aspirate   BREAST SURGERY Right    partial mastectomy   CHOLECYSTECTOMY     COLONOSCOPY N/A 07/22/2012   Procedure: COLONOSCOPY;  Surgeon: [name], MD;  [facility]: [facility];  Service: Endoscopy;  Laterality: N/A;  730   COLONOSCOPY WITH PROPOFOL N/A 10/25/2017   Procedure: COLONOSCOPY WITH PROPOFOL;  Surgeon: [name], MD;  Location: [facility];  Service: Endoscopy;  Laterality: N/A;  10:30   DILATION AND CURETTAGE OF UTERUS     EYE SURGERY     sty removal and reconstruction of eye lids.   NISSEN FUNDOPLICATION     POLYPECTOMY  10/25/2017   Procedure: POLYPECTOMY;  Surgeon: [name], MD;  Location: [facility];  Service: Endoscopy;;  sigmoid    Social History   Social History Narrative   Patient lives at home with chacha (boxer, therapy dog)   Patient right handed   Patient has a BS degree.   Patient drinks 2 cups of tea daily.    There is no immunization history  on file for this patient.   Objective: Vital Signs: BP 130/80 (BP Location: Right Arm, Patient Position: Sitting, Cuff Size: Normal)    Pulse 72    Resp 12    Ht 5' 5.5" (1.664 m)    Wt 120 lb 3.2 oz (54.5 kg)    BMI 19.70 kg/m    Physical Exam Vitals signs and nursing note reviewed.  Constitutional:      Appearance: She is well-developed.  HENT:     Head: Normocephalic and atraumatic.  Eyes:     Conjunctiva/sclera: Conjunctivae normal.  Neck:     Musculoskeletal: Normal range of motion.  Cardiovascular:     Rate and Rhythm: Normal rate and regular rhythm.     Heart sounds: Normal heart sounds.  Pulmonary:     Effort: Pulmonary effort is normal.     Breath sounds: Normal breath sounds.  Abdominal:     General: Bowel sounds  are normal.     Palpations: Abdomen is soft.  Lymphadenopathy:     Cervical: No cervical adenopathy.  Skin:    General: Skin is warm and dry.     Capillary Refill: Capillary refill takes less than 2 seconds.  Neurological:     Mental Status: She is alert and oriented to person, place, and time.  Psychiatric:        Behavior: Behavior normal.      Musculoskeletal Exam: Generalized hyperalgesia and positive tender points.  C-spine good range of motion.  She has trapezius muscle tension and muscle tenderness bilaterally.  She has midline spinal tenderness in the lumbar region.  History of bursitis bilaterally.  Shoulder joints, with joints, wrist joints, MCPs, PIPs, DIPs good range of motion no synovitis.  She has PIP and DIP synovial thickening consistent with osteoarthritis of bilateral hands.  She has complete fist formation bilaterally.  Hip joints have good range of motion with some discomfort.  She has a hematoma on the lateral aspect of the left thigh.  Knee joints, ankle joints, MTPs, PIPs, DIPs good range of motion no synovitis.  No warmth or effusion of bilateral knee joints.  No tenderness or swelling of ankle joints  CDAI Exam: CDAI Score:  -- Patient Global: --; Provider Global: -- Swollen: --; Tender: -- Joint Exam   No joint exam has been documented for this visit   There is currently no information documented on the homunculus. Go to the Rheumatology activity and complete the homunculus joint exam.  Investigation: No additional findings.  Imaging: No results found.  Recent Labs: Lab Results  Component Value Date   WBC 3.3 (L) 10/30/2017   HGB 11.0 (L) 10/30/2017   PLT 169 10/30/2017   NA 139 10/30/2017   K 4.0 10/30/2017   CL 103 10/30/2017   CO2 30 10/30/2017   GLUCOSE 111 (H) 10/30/2017   BUN 8 10/30/2017   CREATININE 0.73 10/30/2017   BILITOT 0.4 10/30/2017   ALKPHOS 72 10/30/2017   AST 23 10/30/2017   ALT 20 10/30/2017   PROT 6.4 (L) 10/30/2017   ALBUMIN 3.9 10/30/2017   CALCIUM 9.0 10/30/2017   GFRAA >60 10/30/2017    Speciality Comments: No specialty comments available.  Procedures:  No procedures performed Allergies: Oxycodone, Reglan [metoclopramide], Tramadol hcl, Codeine, Cymbalta [duloxetine hcl], Eggs or egg-derived products, Gabapentin, Hydrocodone-acetaminophen, Penicillins, Savella [milnacipran hcl], Statins, Voltaren [diclofenac sodium], Zanaflex [tizanidine hcl], and Lyrica [pregabalin]   Assessment / Plan:     Visit Diagnoses: Fibromyalgia - She has generalized hyperalgesia and positive tender points on exam.  She fell on 08/22/18 off of a 8 foot ladder and landed on her left side.  She has been having increased generalized muscle aches and muscle tenderness since then.  She has trapezius muscle tension and muscle tenderness bilaterally she had trigger point junctions on 06/18/2018.  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  She was originally going to the pool twice weekly but is unable to due to the COVID-19 pandemic.  She has been trying to walk for exercise.  She continues to have interrupted sleep at night due to nocturnal  pain.  She has been having worsening fatigue related to insomnia.  We discussed the importance of staying active and exercising on a regular basis.  Other fatigue - She has chronic fatigue related to insomnia.  Primary insomnia -She she has been having difficulty sleeping at night due to discomfort she has  been experiencing.   Sicca syndrome ([place]) - She has chronic sicca symptoms.  She uses Systane eyedrops and ointment at bedtime to help with eye dryness.  Primary osteoarthritis of both hands - She has PIP and DIP synovial thickening consistent with osteoarthritis of bilateral hands.  She is been having increased deafness and pain in the left hand.  She has complete fist formation bilaterally.  No tenderness or synovitis was noted.  Joint protection and muscle strengthening were discussed.  She was given a handout of hand exercises to perform.  She can use Voltaren gel topically as needed for pain relief  DDD (degenerative disc disease), cervical - She has good range of motion with no discomfort.  She has no symptoms of radiculopathy at this time.  She has trapezius muscle tension and muscle tenderness bilaterally.  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  She has been to physical therapy in the past.  She continues to perform home exercises.  When seated for prolonged periods of time she uses a cushion.  Ischial bursitis, right - Chronic pain.  She has tenderness on exam.  She has been to physical therapy in the past and has been performing home exercises recently.  She was originally going to the pool twice weekly but has been unable to due to COVID-19 pandemic.  Trapezius muscle spasm - She has trapezius muscle tension and muscle tenderness bilaterally.  She had trigger point injections on 06/18/2018.  Other medical conditions are listed as follows:   History of migraine   Other neutropenia ([place])   History of anxiety   History of hypothyroidism   History of kidney stones    History of anemia   Orders: No orders of the defined types were placed in this encounter.  No orders of the defined types were placed in this encounter.   Face-to-face time spent with patient was 30 minutes. Greater than 50% of time was spent in counseling and coordination of care.  Follow-Up Instructions: Return in about 6 months (around 05/02/2019) for Fibromyalgia, DDD.   Ofilia Neas, PA-C   I examined and evaluated the patient with Danielle Sams PA.  Patient continues to have a lot of discomfort tenderness in various parts of her body from recent fall.  She also has been having a flare of fibromyalgia.  By her request bilateral trigger point injections per injected.  The plan of care was discussed as noted above.  Bo Merino, MD Note - This record has been created using Editor, commissioning.  Chart creation errors have been sought, but may not always  have been located. Such creation errors do not reflect on  the standard of medical care.

## 2018-10-30 ENCOUNTER — Other Ambulatory Visit: Payer: Self-pay

## 2018-10-30 ENCOUNTER — Ambulatory Visit (INDEPENDENT_AMBULATORY_CARE_PROVIDER_SITE_OTHER): Payer: Medicare Other | Admitting: Rheumatology

## 2018-10-30 ENCOUNTER — Encounter: Payer: Self-pay | Admitting: Rheumatology

## 2018-10-30 VITALS — BP 130/80 | HR 72 | Resp 12 | Ht 65.5 in | Wt 120.2 lb

## 2018-10-30 DIAGNOSIS — Z8659 Personal history of other mental and behavioral disorders: Secondary | ICD-10-CM | POA: Diagnosis not present

## 2018-10-30 DIAGNOSIS — M7071 Other bursitis of hip, right hip: Secondary | ICD-10-CM | POA: Diagnosis not present

## 2018-10-30 DIAGNOSIS — Z8639 Personal history of other endocrine, nutritional and metabolic disease: Secondary | ICD-10-CM

## 2018-10-30 DIAGNOSIS — M19041 Primary osteoarthritis, right hand: Secondary | ICD-10-CM

## 2018-10-30 DIAGNOSIS — D708 Other neutropenia: Secondary | ICD-10-CM

## 2018-10-30 DIAGNOSIS — F5101 Primary insomnia: Secondary | ICD-10-CM

## 2018-10-30 DIAGNOSIS — M7072 Other bursitis of hip, left hip: Secondary | ICD-10-CM | POA: Diagnosis not present

## 2018-10-30 DIAGNOSIS — R5383 Other fatigue: Secondary | ICD-10-CM

## 2018-10-30 DIAGNOSIS — M797 Fibromyalgia: Secondary | ICD-10-CM | POA: Diagnosis not present

## 2018-10-30 DIAGNOSIS — M5030 Other cervical disc degeneration, unspecified cervical region: Secondary | ICD-10-CM | POA: Diagnosis not present

## 2018-10-30 DIAGNOSIS — M62838 Other muscle spasm: Secondary | ICD-10-CM

## 2018-10-30 DIAGNOSIS — M3500 Sicca syndrome, unspecified: Secondary | ICD-10-CM | POA: Diagnosis not present

## 2018-10-30 DIAGNOSIS — Z862 Personal history of diseases of the blood and blood-forming organs and certain disorders involving the immune mechanism: Secondary | ICD-10-CM

## 2018-10-30 DIAGNOSIS — Z8669 Personal history of other diseases of the nervous system and sense organs: Secondary | ICD-10-CM | POA: Diagnosis not present

## 2018-10-30 DIAGNOSIS — Z87442 Personal history of urinary calculi: Secondary | ICD-10-CM

## 2018-10-30 DIAGNOSIS — M19042 Primary osteoarthritis, left hand: Secondary | ICD-10-CM

## 2018-10-30 NOTE — Patient Instructions (Signed)
Hand Exercises °Hand exercises can be helpful for almost anyone. These exercises can strengthen the hands, improve flexibility and movement, and increase blood flow to the hands. These results can make work and daily tasks easier. Hand exercises can be especially helpful for people who have joint pain from arthritis or have nerve damage from overuse (carpal tunnel syndrome). These exercises can also help people who have injured a hand. °Exercises °Most of these hand exercises are gentle stretching and motion exercises. It is usually safe to do them often throughout the day. Warming up your hands before exercise may help to reduce stiffness. You can do this with gentle massage or by placing your hands in warm water for 10-15 minutes. °It is normal to feel some stretching, pulling, tightness, or mild discomfort as you begin new exercises. This will gradually improve. Stop an exercise right away if you feel sudden, severe pain or your pain gets worse. Ask your health care provider which exercises are best for you. °Knuckle bend or "claw" fist °1. Stand or sit with your arm, hand, and all five fingers pointed straight up. Make sure to keep your wrist straight during the exercise. °2. Gently bend your fingers down toward your palm until the tips of your fingers are touching the top of your palm. Keep your big knuckle straight and just bend the small knuckles in your fingers. °3. Hold this position for __________ seconds. °4. Straighten (extend) your fingers back to the starting position. °Repeat this exercise 5-10 times with each hand. °Full finger fist °1. Stand or sit with your arm, hand, and all five fingers pointed straight up. Make sure to keep your wrist straight during the exercise. °2. Gently bend your fingers into your palm until the tips of your fingers are touching the middle of your palm. °3. Hold this position for __________ seconds. °4. Extend your fingers back to the starting position, stretching every  joint fully. °Repeat this exercise 5-10 times with each hand. °Straight fist °1. Stand or sit with your arm, hand, and all five fingers pointed straight up. Make sure to keep your wrist straight during the exercise. °2. Gently bend your fingers at the big knuckle, where your fingers meet your hand, and the middle knuckle. Keep the knuckle at the tips of your fingers straight and try to touch the bottom of your palm. °3. Hold this position for __________ seconds. °4. Extend your fingers back to the starting position, stretching every joint fully. °Repeat this exercise 5-10 times with each hand. °Tabletop °1. Stand or sit with your arm, hand, and all five fingers pointed straight up. Make sure to keep your wrist straight during the exercise. °2. Gently bend your fingers at the big knuckle, where your fingers meet your hand, as far down as you can while keeping the small knuckles in your fingers straight. Think of forming a tabletop with your fingers. °3. Hold this position for __________ seconds. °4. Extend your fingers back to the starting position, stretching every joint fully. °Repeat this exercise 5-10 times with each hand. °Finger spread °1. Place your hand flat on a table with your palm facing down. Make sure your wrist stays straight as you do this exercise. °2. Spread your fingers and thumb apart from each other as far as you can until you feel a gentle stretch. Hold this position for __________ seconds. °3. Bring your fingers and thumb tight together again. Hold this position for __________ seconds. °Repeat this exercise 5-10 times with each hand. °  Making circles °1. Stand or sit with your arm, hand, and all five fingers pointed straight up. Make sure to keep your wrist straight during the exercise. °2. Make a circle by touching the tip of your thumb to the tip of your index finger. °3. Hold for __________ seconds. Then open your hand wide. °4. Repeat this motion with your thumb and each finger on your  hand. °Repeat this exercise 5-10 times with each hand. °Thumb motion °1. Sit with your forearm resting on a table and your wrist straight. Your thumb should be facing up toward the ceiling. Keep your fingers relaxed as you move your thumb. °2. Lift your thumb up as high as you can toward the ceiling. Hold for __________ seconds. °3. Bend your thumb across your palm as far as you can, reaching the tip of your thumb for the small finger (pinkie) side of your palm. Hold for __________ seconds. °Repeat this exercise 5-10 times with each hand. °Grip strengthening ° °1. Hold a stress ball or other soft ball in the middle of your hand. °2. Slowly increase the pressure, squeezing the ball as much as you can without causing pain. Think of bringing the tips of your fingers into the middle of your palm. All of your finger joints should bend when doing this exercise. °3. Hold your squeeze for __________ seconds, then relax. °Repeat this exercise 5-10 times with each hand. °Contact a health care provider if: °· Your hand pain or discomfort gets much worse when you do an exercise. °· Your hand pain or discomfort does not improve within 2 hours after you exercise. °If you have any of these problems, stop doing these exercises right away. Do not do them again unless your health care provider says that you can. °Get help right away if: °· You develop sudden, severe hand pain or swelling. If this happens, stop doing these exercises right away. Do not do them again unless your health care provider says that you can. °This information is not intended to replace advice given to you by your health care provider. Make sure you discuss any questions you have with your health care provider. °Document Released: 02/28/2015 Document Revised: 07/10/2018 Document Reviewed: 03/20/2018 °[employer] Patient Education © 2020 [employer] ° °

## 2018-11-12 DIAGNOSIS — E039 Hypothyroidism, unspecified: Secondary | ICD-10-CM | POA: Diagnosis not present

## 2018-11-12 DIAGNOSIS — D500 Iron deficiency anemia secondary to blood loss (chronic): Secondary | ICD-10-CM | POA: Diagnosis not present

## 2018-11-12 DIAGNOSIS — E782 Mixed hyperlipidemia: Secondary | ICD-10-CM | POA: Diagnosis not present

## 2018-11-12 DIAGNOSIS — Z79899 Other long term (current) drug therapy: Secondary | ICD-10-CM | POA: Diagnosis not present

## 2018-11-12 DIAGNOSIS — D510 Vitamin B12 deficiency anemia due to intrinsic factor deficiency: Secondary | ICD-10-CM | POA: Diagnosis not present

## 2018-11-12 DIAGNOSIS — E559 Vitamin D deficiency, unspecified: Secondary | ICD-10-CM | POA: Diagnosis not present

## 2018-11-14 DIAGNOSIS — E039 Hypothyroidism, unspecified: Secondary | ICD-10-CM | POA: Diagnosis not present

## 2018-11-14 DIAGNOSIS — F419 Anxiety disorder, unspecified: Secondary | ICD-10-CM | POA: Diagnosis not present

## 2018-11-14 DIAGNOSIS — E782 Mixed hyperlipidemia: Secondary | ICD-10-CM | POA: Diagnosis not present

## 2018-11-14 DIAGNOSIS — M797 Fibromyalgia: Secondary | ICD-10-CM | POA: Diagnosis not present

## 2018-11-14 DIAGNOSIS — F329 Major depressive disorder, single episode, unspecified: Secondary | ICD-10-CM | POA: Diagnosis not present

## 2018-11-14 DIAGNOSIS — D72819 Decreased white blood cell count, unspecified: Secondary | ICD-10-CM | POA: Diagnosis not present

## 2018-11-14 DIAGNOSIS — D510 Vitamin B12 deficiency anemia due to intrinsic factor deficiency: Secondary | ICD-10-CM | POA: Diagnosis not present

## 2018-11-14 DIAGNOSIS — D500 Iron deficiency anemia secondary to blood loss (chronic): Secondary | ICD-10-CM | POA: Diagnosis not present

## 2018-12-03 NOTE — Progress Notes (Signed)
Office Visit Note  Patient: Danielle Byrd             Date of Birth: [birth year]           MRN: [id number]             PCP: Allie Dimmer, MD Referring: Allie Dimmer, MD Visit Date: 12/17/2018 Occupation: _0 @  Subjective:  Ischial bursitis bilaterally.   History of Present Illness: Danielle Byrd is a 63 y.o. female with history of fibromyalgia and DDD.  She continues to have generalized muscle aches and muscle tenderness.  She continues to have left lower extremity muscle tenderness after her fall on June 21, 2018.  She states that hematoma has resolved but she continues to have tenderness along the lateral aspect of the left lower extremity.  She has been applying Voltaren gel topically as needed for pain relief.  She continues to take Robaxin 500 mg 3 times daily for muscle spasms.  She has ongoing ischio bursitis bilaterally and continues to use a cushion for support.  She would like cortisone injections today.  She has chronic trapezius muscle tension and muscle tenderness.  She has experienced spasms occasionally.  She states that the previous trigger point injections did provide significant relief but it was temporary.  She continues to have chronic fatigue related to insomnia.  She sleeps about 6 to 7 hours per night but states it is very interrupted.  She takes trazodone 50 mg 1 tablet by mouth at bedtime.  She continues to have chronic sicca symptoms.  She uses Biotene rinse, spray, and toothpaste as well as eyedrops throughout the day and ointment at night.   Activities of Daily Living:  Patient reports morning stiffness for 1.5-3 hours.   Patient Reports nocturnal pain.  Difficulty dressing/grooming: Reports Difficulty climbing stairs: Reports Difficulty getting out of chair: Reports Difficulty using hands for taps, buttons, cutlery, and/or writing: Reports  Review of Systems  Constitutional: Positive for fatigue.  HENT: Positive for mouth dryness and nose  dryness. Negative for mouth sores.   Eyes: Positive for dryness. Negative for pain, itching and visual disturbance.  Respiratory: Negative for cough, hemoptysis, shortness of breath, wheezing and difficulty breathing.   Cardiovascular: Negative for chest pain, palpitations, hypertension and swelling in legs/feet.  Gastrointestinal: Positive for constipation. Negative for abdominal pain, blood in stool and diarrhea.  Genitourinary: Negative for difficulty urinating and painful urination.  Musculoskeletal: Positive for arthralgias, joint pain, joint swelling and morning stiffness. Negative for myalgias, muscle weakness, muscle tenderness and myalgias.  Skin: Negative for color change, pallor, rash, hair loss, nodules/bumps, skin tightness, ulcers and sensitivity to sunlight.  Allergic/Immunologic: Negative for susceptible to infections.  Neurological: Positive for headaches. Negative for dizziness, light-headedness, numbness and memory loss.  Hematological: Negative for swollen glands.  Psychiatric/Behavioral: Positive for depressed mood. Negative for confusion and sleep disturbance. The patient is nervous/anxious.     PMFS History:  Patient Active Problem List   Diagnosis Date Noted  . Hx of colonic polyps 09/05/2017  . Family hx of colon cancer 09/05/2017  . Migraine without aura and without status migrainosus, not intractable 03/04/2017  . Fibromyalgia 11/22/2016  . Other fatigue 11/22/2016  . DDD (degenerative disc disease), cervical 11/22/2016  . Primary osteoarthritis of both hands 11/22/2016  . History of migraine 11/22/2016  . History of anxiety 11/22/2016  . History of hypothyroidism 11/22/2016  . History of kidney stones 11/22/2016  . History of cholecystectomy 11/22/2016  . History of anemia 11/22/2016  .  Hx of bone density study/ normal 2014 this is followed by her Primary Care  11/22/2016  . Sicca syndrome ([place]) 11/22/2016  . 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  Headache(784.0) 10/27/2013  . Leukopenia 07/08/2013  . Anemia 10/31/2011  . Iron deficiency 10/31/2011  . GERD (gastroesophageal reflux disease) 05/14/2011  . Abdominal pain, bilateral upper quadrant 05/14/2011  . H/O fibromyalgia 05/14/2011  . Constipation 05/14/2011  . Hyperlipemia 05/14/2011  . Kidney stones 01/11/2011    Past Medical History:  Diagnosis Date  . Anemia   . Anxiety   . Arthritis   . Cervical stenosis (uterine cervix)   . Closed fracture of left foot   . Constipation   . Copper deficiency   . Depression   . Fibromyalgia   . GERD (gastroesophageal reflux disease)   . High cholesterol   . History of kidney stones   . Hyperlipidemia   . Hypothyroidism   . Kidney stones   . Lumbar stenosis 11/27/2016  . Melanosis coli   . Migraines   . Nephrolithiasis   . Panic attack   . Rosacea   . Sliding hiatal hernia   . Spondylosis, cervical     Family History  Problem Relation Age of Onset  . Diabetes Mother   . Hypertension Mother   . Anemia Mother   . Colon cancer Father   . Cancer Father 23       colon cancer  . Heart attack Father    Past Surgical History:  Procedure Laterality Date  . ABDOMINAL HYSTERECTOMY    . BONE MARROW BIOPSY     & aspirate  . BREAST SURGERY Right    partial mastectomy  . CHOLECYSTECTOMY    . COLONOSCOPY N/A 07/22/2012   Procedure: COLONOSCOPY;  Surgeon: [name], MD;  [facility]: [facility];  Service: Endoscopy;  Laterality: N/A;  730  . COLONOSCOPY WITH PROPOFOL N/A 10/25/2017   Procedure: COLONOSCOPY WITH PROPOFOL;  Surgeon: [name], MD;  Location: [facility];  Service: Endoscopy;  Laterality: N/A;  10:30  . DILATION AND CURETTAGE OF UTERUS    . EYE SURGERY     sty removal and reconstruction of eye lids.  Marland Kitchen NISSEN FUNDOPLICATION    . POLYPECTOMY  10/25/2017   Procedure: POLYPECTOMY;  Surgeon: [name], MD;  Location: [facility];  Service: Endoscopy;;  sigmoid    Social History   Social  History Narrative   Patient lives at home with chacha (boxer, therapy dog)   Patient right handed   Patient has a BS degree.   Patient drinks 2 cups of tea daily.    There is no immunization history on file for this patient.   Objective: Vital Signs: BP 126/72 (BP Location: Right Arm, Patient Position: Sitting, Cuff Size: Normal)   Pulse 66   Resp 13   Ht 5' 5.5" (1.664 m)   Wt 120 lb 12.8 oz (54.8 kg)   BMI 19.80 kg/m    Physical Exam Vitals signs and nursing note reviewed.  Constitutional:      Appearance: She is well-developed.  HENT:     Head: Normocephalic and atraumatic.  Eyes:     Conjunctiva/sclera: Conjunctivae normal.  Neck:     Musculoskeletal: Normal range of motion.  Cardiovascular:     Rate and Rhythm: Normal rate and regular rhythm.     Heart sounds: Normal heart sounds.  Pulmonary:     Effort: Pulmonary effort is normal.  Breath sounds: Normal breath sounds.  Abdominal:     General: Bowel sounds are normal.     Palpations: Abdomen is soft.  Lymphadenopathy:     Cervical: No cervical adenopathy.  Skin:    General: Skin is warm and dry.     Capillary Refill: Capillary refill takes less than 2 seconds.  Neurological:     Mental Status: She is alert and oriented to person, place, and time.  Psychiatric:        Behavior: Behavior normal.      Musculoskeletal Exam: She has generalized hyperalgesia and positive tender points on exam.  C-spine limited range of motion.  She has trapezius muscle tension and muscle tenderness bilaterally.  Thoracic and lumbar spine good range of motion with some discomfort.  Shoulder joints, elbow joints, wrist joints, MCPs and PIPs and DIPs good range of motion no synovitis.  She has mild PIP and DIP synovial thickening consistent with osteoarthritis of bilateral hands.  Hip joints, knee joints, ankle joints, MTPs, PIPs and DIPs good range of motion no synovitis.  No warmth or effusion bilateral knee joints.  No tenderness or  swelling of ankle joints.  She has tenderness along the left IT band.  She has ischial bursitis bilaterally.  CDAI Exam: CDAI Score: - Patient Global: -; Provider Global: - Swollen: -; Tender: - Joint Exam   No joint exam has been documented for this visit   There is currently no information documented on the homunculus. Go to the Rheumatology activity and complete the homunculus joint exam.  Investigation: No additional findings.  Imaging: No results found.  Recent Labs: Lab Results  Component Value Date   WBC 3.3 (L) 10/30/2017   HGB 11.0 (L) 10/30/2017   PLT 169 10/30/2017   NA 139 10/30/2017   K 4.0 10/30/2017   CL 103 10/30/2017   CO2 30 10/30/2017   GLUCOSE 111 (H) 10/30/2017   BUN 8 10/30/2017   CREATININE 0.73 10/30/2017   BILITOT 0.4 10/30/2017   ALKPHOS 72 10/30/2017   AST 23 10/30/2017   ALT 20 10/30/2017   PROT 6.4 (L) 10/30/2017   ALBUMIN 3.9 10/30/2017   CALCIUM 9.0 10/30/2017   GFRAA >60 10/30/2017    Speciality Comments: No specialty comments available.  Procedures:  Large Joint Inj (Right ischial bursa ) on 12/17/2018 2:12 PM Indications: pain Details: 27 G 1.5 in needle Medications: 1 mL lidocaine 1 %; 30 mg triamcinolone acetonide 40 MG/ML Aspirate: 0 mL Outcome: tolerated well, no immediate complications Procedure, treatment alternatives, risks and benefits explained, specific risks discussed. Consent was given by the patient. Immediately prior to procedure a time out was called to verify the correct patient, procedure, equipment, support staff and site/side marked as required. Patient was prepped and draped in the usual sterile fashion.   Large Joint Inj (Left ischial bursa ) on 12/17/2018 2:13 PM Indications: pain Details: 27 G 1.5 in needle Medications: 1 mL lidocaine 1 %; 30 mg triamcinolone acetonide 40 MG/ML Aspirate: 0 mL Outcome: tolerated well, no immediate complications Procedure, treatment alternatives, risks and benefits  explained, specific risks discussed. Consent was given by the patient. Immediately prior to procedure a time out was called to verify the correct patient, procedure, equipment, support staff and site/side marked as required. Patient was prepped and draped in the usual sterile fashion.     Allergies: Oxycodone, Reglan [metoclopramide], Tramadol hcl, Codeine, Cymbalta [duloxetine hcl], Eggs or egg-derived products, Gabapentin, Hydrocodone-acetaminophen, Penicillins, Savella [milnacipran hcl], Statins, Voltaren [diclofenac sodium],  Zanaflex [tizanidine hcl], and Lyrica [pregabalin]   Assessment / Plan:     Visit Diagnoses: Fibromyalgia: She has generalized hyperalgesia and positive tender points on exam.  She continues with generalized muscle aches muscle tenderness due to fibromyalgia.  She has been getting frequent and severe fibromyalgia flares.  She has been having increased muscle tenderness in the left lower extremity since her fall on Aug 21, 2018.  The hematoma has resolved but she continues to have tenderness along the left IT band.  She has chronic ischio bursitis bilaterally and would like cortisone injections today.  She has trapezius muscle tension and muscle tenderness bilaterally and occasionally experiences muscle spasms.  She continues take Robaxin 500 mg 3 times daily for muscle spasms.  She has chronic fatigue related to insomnia.  She sleeps about 6 to 7 hours per night but has interrupted sleep due to nocturnal pain.  She takes trazodone 50 mg 1 tablet by mouth at bedtime for insomnia.  She was encouraged to try to stretch and exercise on a regular basis.  She will follow-up in the office in 6 months.  Primary insomnia: She has difficulty sleeping at night due to nocturnal pain.  She sleeps a 6 to 7 hours per night.  She takes trazodone 50 mg 1 tablet by mouth at bedtime.  Other fatigue: She has chronic fatigue related to insomnia.   Sicca syndrome ([place]): She has chronic sicca  symptoms.  She uses over-the-counter products for symptomatic relief.  She continues to use Biotene mouthwash, spray and toothpaste.  She uses eyedrops throughout the day and ointment at bedtime.  Primary osteoarthritis of both hands: She has mild PIP and DIP synovial thickening consistent with osteoarthritis of both hands.  She has no tenderness or synovitis on exam.  Joint protection and muscle strengthening were discussed.   DDD (degenerative disc disease), cervical: She has limited ROM with discomfort.  No symptoms of radiculopathy at this time. She has trapezius muscle tension and tenderness.    Ischial bursitis, right: She has tenderness on exam.  She requested a cortisone injection today.  She tolerated the procedure well.  The procedure note was completed above.  Aftercare was discussed.  She was encouraged to continue to use a seat cushion.  Ischial bursitis of left side: She has tenderness on exam.  She requested a cortisone injection today.  She tolerated procedure well.  The procedure note was completed above.  Aftercare was discussed.  She was encouraged to continue to use a seat cushion.  Trapezius muscle spasm: She has trapezius muscle tension and muscle tenderness bilaterally.  She experiences intermittent muscle spasms.  She takes Robaxin 500 mg 3 times daily for muscle spasms.  She was advised to notify us if she would like to return for trigger point cortisone injections in the future.  Other medical conditions are listed as follows:   History of anemia  History of anxiety  History of migraine  History of kidney stones  History of hypothyroidism  Other neutropenia ([place])  Orders: Orders Placed This Encounter  Procedures  . Large Joint Inj  . Large Joint Inj   No orders of the defined types were placed in this encounter.   Face-to-face time spent with patient was 30 minutes. Greater than 50% of time was spent in counseling and coordination of care.  Follow-Up  Instructions: Return in about 6 months (around 06/16/2019) for Fibromyalgia, DDD.   Ofilia Neas, PA-C  Note - This record has been created  using Editor, commissioning.  Chart creation errors have been sought, but may not always  have been located. Such creation errors do not reflect on  the standard of medical care.

## 2018-12-17 ENCOUNTER — Ambulatory Visit (INDEPENDENT_AMBULATORY_CARE_PROVIDER_SITE_OTHER): Payer: Medicare Other | Admitting: Physician Assistant

## 2018-12-17 ENCOUNTER — Encounter: Payer: Self-pay | Admitting: Physician Assistant

## 2018-12-17 ENCOUNTER — Other Ambulatory Visit: Payer: Self-pay

## 2018-12-17 VITALS — BP 126/72 | HR 66 | Resp 13 | Ht 65.5 in | Wt 120.8 lb

## 2018-12-17 DIAGNOSIS — Z8669 Personal history of other diseases of the nervous system and sense organs: Secondary | ICD-10-CM | POA: Diagnosis not present

## 2018-12-17 DIAGNOSIS — M7071 Other bursitis of hip, right hip: Secondary | ICD-10-CM

## 2018-12-17 DIAGNOSIS — F5101 Primary insomnia: Secondary | ICD-10-CM

## 2018-12-17 DIAGNOSIS — M19042 Primary osteoarthritis, left hand: Secondary | ICD-10-CM

## 2018-12-17 DIAGNOSIS — M3500 Sicca syndrome, unspecified: Secondary | ICD-10-CM

## 2018-12-17 DIAGNOSIS — M5030 Other cervical disc degeneration, unspecified cervical region: Secondary | ICD-10-CM | POA: Diagnosis not present

## 2018-12-17 DIAGNOSIS — M19041 Primary osteoarthritis, right hand: Secondary | ICD-10-CM

## 2018-12-17 DIAGNOSIS — Z8639 Personal history of other endocrine, nutritional and metabolic disease: Secondary | ICD-10-CM

## 2018-12-17 DIAGNOSIS — Z8659 Personal history of other mental and behavioral disorders: Secondary | ICD-10-CM | POA: Diagnosis not present

## 2018-12-17 DIAGNOSIS — Z862 Personal history of diseases of the blood and blood-forming organs and certain disorders involving the immune mechanism: Secondary | ICD-10-CM

## 2018-12-17 DIAGNOSIS — M7072 Other bursitis of hip, left hip: Secondary | ICD-10-CM | POA: Diagnosis not present

## 2018-12-17 DIAGNOSIS — R5383 Other fatigue: Secondary | ICD-10-CM | POA: Diagnosis not present

## 2018-12-17 DIAGNOSIS — M62838 Other muscle spasm: Secondary | ICD-10-CM

## 2018-12-17 DIAGNOSIS — Z87442 Personal history of urinary calculi: Secondary | ICD-10-CM

## 2018-12-17 DIAGNOSIS — M797 Fibromyalgia: Secondary | ICD-10-CM

## 2018-12-17 DIAGNOSIS — D708 Other neutropenia: Secondary | ICD-10-CM

## 2018-12-17 MED ORDER — LIDOCAINE HCL 1 % IJ SOLN
1.0000 mL | INTRAMUSCULAR | Status: AC | PRN
  Administered 2018-12-17: 1 mL

## 2018-12-17 MED ORDER — TRIAMCINOLONE ACETONIDE 40 MG/ML IJ SUSP
30.0000 mg | INTRAMUSCULAR | Status: AC | PRN
  Administered 2018-12-17: 30 mg via INTRA_ARTICULAR

## 2018-12-17 NOTE — Patient Instructions (Signed)
Iliotibial Band Syndrome Rehab Ask your health care provider which exercises are safe for you. Do exercises exactly as told by your health care provider and adjust them as directed. It is normal to feel mild stretching, pulling, tightness, or discomfort as you do these exercises. Stop right away if you feel sudden pain or your pain gets significantly worse. Do not begin these exercises until told by your health care provider. Stretching and range-of-motion exercises These exercises warm up your muscles and joints and improve the movement and flexibility of your hip and pelvis. Quadriceps stretch, prone  1. Lie on your abdomen on a firm surface, such as a bed or padded floor (prone position). 2. Bend your left / right knee and reach back to hold your ankle or pant leg. If you cannot reach your ankle or pant leg, loop a belt around your foot and grab the belt instead. 3. Gently pull your heel toward your buttocks. Your knee should not slide out to the side. You should feel a stretch in the front of your thigh and knee (quadriceps). 4. Hold this position for __________ seconds. Repeat __________ times. Complete this exercise __________ times a day. Iliotibial band stretch An iliotibial band is a strong band of muscle tissue that runs from the outer side of your hip to the outer side of your thigh and knee. 1. Lie on your side with your left / right leg in the top position. 2. Bend both of your knees and grab your left / right ankle. Stretch out your bottom arm to help you balance. 3. Slowly bring your top knee back so your thigh goes behind your trunk. 4. Slowly lower your top leg toward the floor until you feel a gentle stretch on the outside of your left / right hip and thigh. If you do not feel a stretch and your knee will not fall farther, place the heel of your other foot on top of your knee and pull your knee down toward the floor with your foot. 5. Hold this position for __________ seconds.  Repeat __________ times. Complete this exercise __________ times a day. Strengthening exercises These exercises build strength and endurance in your hip and pelvis. Endurance is the ability to use your muscles for a long time, even after they get tired. Straight leg raises, side-lying This exercise strengthens the muscles that rotate the leg at the hip and move it away from your body (hip abductors). 1. Lie on your side with your left / right leg in the top position. Lie so your head, shoulder, hip, and knee line up. You may bend your bottom knee to help you balance. 2. Roll your hips slightly forward so your hips are stacked directly over each other and your left / right knee is facing forward. 3. Tense the muscles in your outer thigh and lift your top leg 4-6 inches (10-15 cm). 4. Hold this position for __________ seconds. 5. Slowly return to the starting position. Let your muscles relax completely before doing another repetition. Repeat __________ times. Complete this exercise __________ times a day. Leg raises, prone This exercise strengthens the muscles that move the hips (hip extensors). 1. Lie on your abdomen on your bed or a firm surface. You can put a pillow under your hips if that is more comfortable for your lower back. 2. Bend your left / right knee so your foot is straight up in the air. 3. Squeeze your buttocks muscles and lift your left / right thigh   off the bed. Do not let your back arch. 4. Tense your thigh muscle as hard as you can without increasing any knee pain. 5. Hold this position for __________ seconds. 6. Slowly lower your leg to the starting position and allow it to relax completely. Repeat __________ times. Complete this exercise __________ times a day. Hip hike 1. Stand sideways on a bottom step. Stand on your left / right leg with your other foot unsupported next to the step. You can hold on to the railing or wall for balance if needed. 2. Keep your knees straight  and your torso square. Then lift your left / right hip up toward the ceiling. 3. Slowly let your left / right hip lower toward the floor, past the starting position. Your foot should get closer to the floor. Do not lean or bend your knees. Repeat __________ times. Complete this exercise __________ times a day. This information is not intended to replace advice given to you by your health care provider. Make sure you discuss any questions you have with your health care provider. Document Released: 03/19/2005 Document Revised: 07/10/2018 Document Reviewed: 01/08/2018 [employer] Patient Education  2020 [employer]  

## 2018-12-23 DIAGNOSIS — H43811 Vitreous degeneration, right eye: Secondary | ICD-10-CM | POA: Diagnosis not present

## 2019-02-02 DIAGNOSIS — E039 Hypothyroidism, unspecified: Secondary | ICD-10-CM | POA: Diagnosis not present

## 2019-05-01 ENCOUNTER — Telehealth: Payer: Self-pay | Admitting: Rheumatology

## 2019-05-01 NOTE — Progress Notes (Signed)
Office Visit Note  Patient: Danielle Byrd             Date of Birth: 1955-08-06           MRN: [id number]             PCP: Allie Dimmer, MD Referring: Allie Dimmer, MD Visit Date: 05/04/2019 Occupation: '@[email or web address]' @  Subjective:  Trapezius muscle spasms   History of Present Illness: Danielle Byrd is a 64 y.o. female with history of fibromyalgia and DDD. She presents today with trapezius muscle tension and tenderness.  She requested trigger point injections today. She has been using warm compresses, which help. She has persistent pain along the left IT band.  She had a fall in May and developed a hematoma in this region.  She states the pain finally subsided in December 2020 but returned about 3 weeks ago. She takes tylenol as needed for pain relief and uses voltaren gel topically.  She states using an ace wrap, warm compresses, and rest alleviate her discomfort. She denies any other recent falls or injuries.    Activities of Daily Living:  Patient reports morning stiffness for 3 hours.   Patient Reports nocturnal pain.  Difficulty dressing/grooming: Denies Difficulty climbing stairs: Reports Difficulty getting out of chair: Reports Difficulty using hands for taps, buttons, cutlery, and/or writing: Reports  Review of Systems  Constitutional: Positive for fatigue.  HENT: Positive for mouth dryness and nose dryness. Negative for mouth sores.   Eyes: Positive for dryness. Negative for pain and visual disturbance.  Respiratory: Negative for cough, hemoptysis, shortness of breath and difficulty breathing.   Cardiovascular: Negative for chest pain, palpitations, hypertension and swelling in legs/feet.  Gastrointestinal: Negative for blood in stool, constipation and diarrhea.  Endocrine: Negative for increased urination.  Genitourinary: Negative for difficulty urinating and painful urination.  Musculoskeletal: Positive for arthralgias, joint pain, joint swelling, myalgias, morning  stiffness, muscle tenderness and myalgias. Negative for muscle weakness.  Skin: Negative for color change, pallor, rash, hair loss, nodules/bumps, skin tightness, ulcers and sensitivity to sunlight.  Allergic/Immunologic: Negative for susceptible to infections.  Neurological: Negative for numbness and headaches.  Hematological: Negative for swollen glands.  Psychiatric/Behavioral: Positive for sleep disturbance. Negative for depressed mood. The patient is nervous/anxious.     PMFS History:  Patient Active Problem List   Diagnosis Date Noted  . Hx of colonic polyps 09/05/2017  . Family hx of colon cancer 09/05/2017  . Migraine without aura and without status migrainosus, not intractable 03/04/2017  . Fibromyalgia 11/22/2016  . Other fatigue 11/22/2016  . DDD (degenerative disc disease), cervical 11/22/2016  . Primary osteoarthritis of both hands 11/22/2016  . History of migraine 11/22/2016  . History of anxiety 11/22/2016  . History of hypothyroidism 11/22/2016  . History of kidney stones 11/22/2016  . History of cholecystectomy 11/22/2016  . History of anemia 11/22/2016  . Hx of bone density study/ normal 2014 this is followed by her Primary Care  11/22/2016  . Sicca syndrome ([place]) 11/22/2016  . 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 Headache(784.0) 10/27/2013  . Leukopenia 07/08/2013  . Anemia 10/31/2011  . Iron deficiency 10/31/2011  . GERD (gastroesophageal reflux disease) 05/14/2011  . Abdominal pain, bilateral upper quadrant 05/14/2011  . H/O fibromyalgia 05/14/2011  . Constipation 05/14/2011  . Hyperlipemia 05/14/2011  . Kidney stones 01/11/2011    Past Medical History:  Diagnosis Date  . Anemia   . Anxiety   . Arthritis   . Cervical stenosis (uterine cervix)   .  Closed fracture of left foot   . Constipation   . Copper deficiency   . Depression   . Fibromyalgia   . GERD (gastroesophageal reflux disease)   . High cholesterol   . History of kidney stones   .  Hyperlipidemia   . Hypothyroidism   . Kidney stones   . Lumbar stenosis 11/27/2016  . Melanosis coli   . Migraines   . Nephrolithiasis   . Panic attack   . Rosacea   . Sliding hiatal hernia   . Spondylosis, cervical     Family History  Problem Relation Age of Onset  . Diabetes Mother   . Hypertension Mother   . Anemia Mother   . Colon cancer Father   . Cancer Father 39       colon cancer  . Heart attack Father    Past Surgical History:  Procedure Laterality Date  . ABDOMINAL HYSTERECTOMY    . BONE MARROW BIOPSY     & aspirate  . BREAST SURGERY Right    partial mastectomy  . CHOLECYSTECTOMY    . COLONOSCOPY N/A 07/22/2012   Procedure: COLONOSCOPY;  Surgeon: [name], MD;  [facility]: [facility];  Service: Endoscopy;  Laterality: N/A;  730  . COLONOSCOPY WITH PROPOFOL N/A 10/25/2017   Procedure: COLONOSCOPY WITH PROPOFOL;  Surgeon: [name], MD;  Location: [facility];  Service: Endoscopy;  Laterality: N/A;  10:30  . DILATION AND CURETTAGE OF UTERUS    . EYE SURGERY     sty removal and reconstruction of eye lids.  Marland Kitchen NISSEN FUNDOPLICATION    . POLYPECTOMY  10/25/2017   Procedure: POLYPECTOMY;  Surgeon: [name], MD;  Location: [facility];  Service: Endoscopy;;  sigmoid    Social History   Social History Narrative   Patient lives at home with chacha (boxer, therapy dog)   Patient right handed   Patient has a BS degree.   Patient drinks 2 cups of tea daily.    There is no immunization history on file for this patient.   Objective: Vital Signs: BP 135/74 (BP Location: Right Arm, Patient Position: Sitting, Cuff Size: Normal)   Pulse 72   Resp 12   Ht 5' 5.5" (1.664 m)   Wt 119 lb 12.8 oz (54.3 kg)   BMI 19.63 kg/m    Physical Exam Vitals and nursing note reviewed.  Constitutional:      Appearance: She is well-developed.  HENT:     Head: Normocephalic and atraumatic.  Eyes:     Conjunctiva/sclera: Conjunctivae normal.    Cardiovascular:     Rate and Rhythm: Normal rate and regular rhythm.     Heart sounds: Normal heart sounds.  Pulmonary:     Effort: Pulmonary effort is normal.     Breath sounds: Normal breath sounds.  Abdominal:     General: Bowel sounds are normal.     Palpations: Abdomen is soft.  Musculoskeletal:     Cervical back: Normal range of motion.  Lymphadenopathy:     Cervical: No cervical adenopathy.  Skin:    General: Skin is warm and dry.     Capillary Refill: Capillary refill takes less than 2 seconds.  Neurological:     Mental Status: She is alert and oriented to person, place, and time.  Psychiatric:        Behavior: Behavior normal.      Musculoskeletal Exam: Generalized hyperalgesia and positive tender point. C-spine limited ROM with lateral  rotation.  Thoracic and lumbar spine good ROM.  Shoulder joints, elbow joints, wrist joints, MCPs, PIPs, and DIPs good ROM with no synovitis. Complete fist formation bilaterally.  Hip joints good ROM with no discomfort.  No tenderness over trochanteric bursa.  Tenderness over left IT band.  Knee joints and ankle joints have good ROM with no warmth or effusion.   CDAI Exam: CDAI Score: -- Patient Global: --; Provider Global: -- Swollen: --; Tender: -- Joint Exam 05/04/2019   No joint exam has been documented for this visit   There is currently no information documented on the homunculus. Go to the Rheumatology activity and complete the homunculus joint exam.  Investigation: No additional findings.  Imaging: XR FEMUR MIN 2 VIEWS LEFT  Result Date: 05/04/2019 4 views of femur were obtained.  No bony abnormalities were noted.  No cortical defect was noted.  There was no evidence of fracture or callus formation. Impression: Unremarkable x-ray of the femur.   Recent Labs: Lab Results  Component Value Date   WBC 3.3 (L) 10/30/2017   HGB 11.0 (L) 10/30/2017   PLT 169 10/30/2017   NA 139 10/30/2017   K 4.0 10/30/2017   CL 103  10/30/2017   CO2 30 10/30/2017   GLUCOSE 111 (H) 10/30/2017   BUN 8 10/30/2017   CREATININE 0.73 10/30/2017   BILITOT 0.4 10/30/2017   ALKPHOS 72 10/30/2017   AST 23 10/30/2017   ALT 20 10/30/2017   PROT 6.4 (L) 10/30/2017   ALBUMIN 3.9 10/30/2017   CALCIUM 9.0 10/30/2017   GFRAA >60 10/30/2017    Speciality Comments: No specialty comments available.  Procedures:  Trigger Point Inj  Date/Time: 05/04/2019 11:44 AM Performed by: Ofilia Neas, PA-C Authorized by: Ofilia Neas, PA-C   Consent Given by:  Patient Site marked: the procedure site was marked   Timeout: prior to procedure the correct patient, procedure, and site was verified   Indications:  Pain Total # of Trigger Points:  2 Location: neck   Needle Size:  27 G Approach:  Dorsal Medications #1:  0.5 mL lidocaine 1 %; 10 mg triamcinolone acetonide 40 MG/ML Medications #2:  0.5 mL lidocaine 1 %; 10 mg triamcinolone acetonide 40 MG/ML Patient tolerance:  Patient tolerated the procedure well with no immediate complications   Allergies: Oxycodone, Reglan [metoclopramide], Tramadol hcl, Codeine, Cymbalta [duloxetine hcl], Eggs or egg-derived products, Gabapentin, Hydrocodone-acetaminophen, Penicillins, Savella [milnacipran hcl], Statins, Voltaren [diclofenac sodium], Zanaflex [tizanidine hcl], and Lyrica [pregabalin]   Assessment / Plan:     Visit Diagnoses: Fibromyalgia: She has generalized hyperalgesia and positive tender points on exam.  She continues with generalized muscle aches muscle tenderness due to fibromyalgia.  She experiences trapezius muscle spasms intermittently.  She requested trigger point injections today.  She tolerated the procedure well.  She takes Tylenol as needed for pain relief and uses Voltaren gel topically.  A refill Voltaren gel sent to the pharmacy today.  She performs stretching exercises for about 1 hour daily which helps with her overall stiffness.  We discussed the importance of regular  exercise and good sleep hygiene.  She will follow-up in the office in 6 months.  Primary insomnia: She experiences nocturnal pain which contributes to her insomnia.  We discussed importance of good sleep hygiene. She takes trazodone 50 mg 1 tablet by mouth at bedtime for insomnia.   Other fatigue: Chronic and related to insomnia  Sicca syndrome ([place]): She has chronic sicca symptoms.  She uses Systane eyedrops  as needed for symptomatic relief.  Primary osteoarthritis of both hands: She experiences intermittent pain in both hands.  She has no tenderness or synovitis on exam today.  She has complete fist formation bilaterally.  She has been performing hand exercises on a daily basis.  Joint protection and muscle strengthening were discussed.  DDD (degenerative disc disease), cervical: She has limited range of motion with lateral rotation.  She has good flexion extension.  She is experiencing trapezius muscle spasms bilaterally.  She requested trigger point injections today.  She was encouraged to continue to do her neck exercises on a regular basis.  She also uses warm compresses as needed for pain relief.  Trapezius muscle spasm: She presents today with trapezius muscle tension and muscle tenderness bilaterally.  She has been experiencing muscle spasms intermittently.  She takes Robaxin 500 mg 1 tablet 3 times daily as needed for muscle spasms.  She requested trigger point injections today.  She tolerated the procedure well.  The procedure note was completed above.  Ischial bursitis, right: Chronic.  She perform stretching exercises on a daily basis.  She uses a cushion for support.  Ischial bursitis of left side - Chronic. She uses a seat cushion for support.   Pain of left femur -the patient had a fall on 08/22/2018 after falling off a ladder landing on her left side.  She developed a hematoma on the lateral aspect of the left thigh after the fall and has a 3 cm mass palpable.  She has had  persistent discomfort on the lateral aspect of the left thigh.  She has tenderness along the proximal 1/3 of the IT band.  She has been wearing an Ace wrap and applying warm compresses.  She has also tried using Voltaren gel topically and taking Tylenol as needed for pain relief.  X-rays of the left femur were obtained today.  No cortical defect or callus formation noted. She was given a list of natural antiinflammatories and is already taking turmeric and ginger.   She would like a referral to an orthopedist for further evaluation. Plan: XR FEMUR MIN 2 VIEWS LEFT  It band syndrome, left: She has tenderness along the proximal 1/3rd of the left IT band.  No hematoma or masses palpable.  She has been wearing an Ace wrap, resting, applying Voltaren gel topically, and warm compresses which have provided some relief.  She is having difficulty ambulating due to discomfort.  X-rays of the left femur were obtained today which were unremarkable. She was given a handout of exercises to perform. She declined PT at this time.  Other medical conditions are listed as follows:   History of anemia  History of migraine  History of kidney stones  History of hypothyroidism  Other neutropenia (HCC)  History of cholecystectomy  History of anxiety    Orders: Orders Placed This Encounter  Procedures  . Trigger Point Inj  . XR FEMUR MIN 2 VIEWS LEFT  . Ambulatory referral to Orthopedic Surgery   Meds ordered this encounter  Medications  . diclofenac Sodium (VOLTAREN) 1 % GEL    Sig: Apply 2g to 4g to affected area up to 4 times daily as needed.    Dispense:  400 g    Refill:  4    Face-to-face time spent with patient was 30 minutes. Greater than 50% of time was spent in counseling and coordination of care.  Follow-Up Instructions: Return in about 6 months (around 11/01/2019) for Fibromyalgia, Osteoarthritis, DDD.  Lovena Le  Lendon Ka  I examined and evaluated the patient with [name] PA.  Patient  continues to have generalized pain and discomfort from underlying osteoarthritis and fibromyalgia.  By her request bilateral trapezius injections were given as described above.  She also had a hematoma on her left lower extremities.  She has a residual nodule of about 3 mm circumference.  We did obtain x-ray of her left femur which was unremarkable.  She is very much concerned about the residual hematoma.  Will refer her to orthopedics.  The plan of care was discussed as noted above.  Bo Merino, MD   Note - This record has been created using Editor, commissioning.  Chart creation errors have been sought, but may not always  have been located. Such creation errors do not reflect on  the standard of medical care.

## 2019-05-04 ENCOUNTER — Ambulatory Visit (INDEPENDENT_AMBULATORY_CARE_PROVIDER_SITE_OTHER): Payer: Medicare Other | Admitting: Rheumatology

## 2019-05-04 ENCOUNTER — Other Ambulatory Visit: Payer: Self-pay

## 2019-05-04 ENCOUNTER — Encounter: Payer: Self-pay | Admitting: Rheumatology

## 2019-05-04 ENCOUNTER — Ambulatory Visit (INDEPENDENT_AMBULATORY_CARE_PROVIDER_SITE_OTHER): Payer: Medicare Other

## 2019-05-04 VITALS — BP 135/74 | HR 72 | Resp 12 | Ht 65.5 in | Wt 119.8 lb

## 2019-05-04 DIAGNOSIS — M7071 Other bursitis of hip, right hip: Secondary | ICD-10-CM | POA: Diagnosis not present

## 2019-05-04 DIAGNOSIS — Z862 Personal history of diseases of the blood and blood-forming organs and certain disorders involving the immune mechanism: Secondary | ICD-10-CM

## 2019-05-04 DIAGNOSIS — Z8639 Personal history of other endocrine, nutritional and metabolic disease: Secondary | ICD-10-CM

## 2019-05-04 DIAGNOSIS — M5030 Other cervical disc degeneration, unspecified cervical region: Secondary | ICD-10-CM | POA: Diagnosis not present

## 2019-05-04 DIAGNOSIS — M7632 Iliotibial band syndrome, left leg: Secondary | ICD-10-CM

## 2019-05-04 DIAGNOSIS — M7072 Other bursitis of hip, left hip: Secondary | ICD-10-CM

## 2019-05-04 DIAGNOSIS — M797 Fibromyalgia: Secondary | ICD-10-CM

## 2019-05-04 DIAGNOSIS — Z87442 Personal history of urinary calculi: Secondary | ICD-10-CM

## 2019-05-04 DIAGNOSIS — R5383 Other fatigue: Secondary | ICD-10-CM | POA: Diagnosis not present

## 2019-05-04 DIAGNOSIS — F5101 Primary insomnia: Secondary | ICD-10-CM | POA: Diagnosis not present

## 2019-05-04 DIAGNOSIS — M898X5 Other specified disorders of bone, thigh: Secondary | ICD-10-CM

## 2019-05-04 DIAGNOSIS — M62838 Other muscle spasm: Secondary | ICD-10-CM

## 2019-05-04 DIAGNOSIS — M19041 Primary osteoarthritis, right hand: Secondary | ICD-10-CM

## 2019-05-04 DIAGNOSIS — D708 Other neutropenia: Secondary | ICD-10-CM

## 2019-05-04 DIAGNOSIS — M3500 Sicca syndrome, unspecified: Secondary | ICD-10-CM | POA: Diagnosis not present

## 2019-05-04 DIAGNOSIS — Z8659 Personal history of other mental and behavioral disorders: Secondary | ICD-10-CM

## 2019-05-04 DIAGNOSIS — Z8669 Personal history of other diseases of the nervous system and sense organs: Secondary | ICD-10-CM | POA: Diagnosis not present

## 2019-05-04 DIAGNOSIS — Z9049 Acquired absence of other specified parts of digestive tract: Secondary | ICD-10-CM

## 2019-05-04 DIAGNOSIS — M19042 Primary osteoarthritis, left hand: Secondary | ICD-10-CM

## 2019-05-04 MED ORDER — TRIAMCINOLONE ACETONIDE 40 MG/ML IJ SUSP
10.0000 mg | INTRAMUSCULAR | Status: AC | PRN
  Administered 2019-05-04: 10 mg via INTRAMUSCULAR

## 2019-05-04 MED ORDER — LIDOCAINE HCL 1 % IJ SOLN
0.5000 mL | INTRAMUSCULAR | Status: AC | PRN
  Administered 2019-05-04: .5 mL

## 2019-05-04 MED ORDER — DICLOFENAC SODIUM 1 % EX GEL: CUTANEOUS | 4 refills | Status: AC

## 2019-05-04 NOTE — Patient Instructions (Signed)
Iliotibial Band Syndrome Rehab Ask your health care provider which exercises are safe for you. Do exercises exactly as told by your health care provider and adjust them as directed. It is normal to feel mild stretching, pulling, tightness, or discomfort as you do these exercises. Stop right away if you feel sudden pain or your pain gets significantly worse. Do not begin these exercises until told by your health care provider. Stretching and range-of-motion exercises These exercises warm up your muscles and joints and improve the movement and flexibility of your hip and pelvis. Quadriceps stretch, prone  1. Lie on your abdomen on a firm surface, such as a bed or padded floor (prone position). 2. Bend your left / right knee and reach back to hold your ankle or pant leg. If you cannot reach your ankle or pant leg, loop a belt around your foot and grab the belt instead. 3. Gently pull your heel toward your buttocks. Your knee should not slide out to the side. You should feel a stretch in the front of your thigh and knee (quadriceps). 4. Hold this position for __________ seconds. Repeat __________ times. Complete this exercise __________ times a day. Iliotibial band stretch An iliotibial band is a strong band of muscle tissue that runs from the outer side of your hip to the outer side of your thigh and knee. 1. Lie on your side with your left / right leg in the top position. 2. Bend both of your knees and grab your left / right ankle. Stretch out your bottom arm to help you balance. 3. Slowly bring your top knee back so your thigh goes behind your trunk. 4. Slowly lower your top leg toward the floor until you feel a gentle stretch on the outside of your left / right hip and thigh. If you do not feel a stretch and your knee will not fall farther, place the heel of your other foot on top of your knee and pull your knee down toward the floor with your foot. 5. Hold this position for __________  seconds. Repeat __________ times. Complete this exercise __________ times a day. Strengthening exercises These exercises build strength and endurance in your hip and pelvis. Endurance is the ability to use your muscles for a long time, even after they get tired. Straight leg raises, side-lying This exercise strengthens the muscles that rotate the leg at the hip and move it away from your body (hip abductors). 1. Lie on your side with your left / right leg in the top position. Lie so your head, shoulder, hip, and knee line up. You may bend your bottom knee to help you balance. 2. Roll your hips slightly forward so your hips are stacked directly over each other and your left / right knee is facing forward. 3. Tense the muscles in your outer thigh and lift your top leg 4-6 inches (10-15 cm). 4. Hold this position for __________ seconds. 5. Slowly return to the starting position. Let your muscles relax completely before doing another repetition. Repeat __________ times. Complete this exercise __________ times a day. Leg raises, prone This exercise strengthens the muscles that move the hips (hip extensors). 1. Lie on your abdomen on your bed or a firm surface. You can put a pillow under your hips if that is more comfortable for your lower back. 2. Bend your left / right knee so your foot is straight up in the air. 3. Squeeze your buttocks muscles and lift your left / right thigh   off the bed. Do not let your back arch. 4. Tense your thigh muscle as hard as you can without increasing any knee pain. 5. Hold this position for __________ seconds. 6. Slowly lower your leg to the starting position and allow it to relax completely. Repeat __________ times. Complete this exercise __________ times a day. Hip hike 1. Stand sideways on a bottom step. Stand on your left / right leg with your other foot unsupported next to the step. You can hold on to the railing or wall for balance if needed. 2. Keep your knees  straight and your torso square. Then lift your left / right hip up toward the ceiling. 3. Slowly let your left / right hip lower toward the floor, past the starting position. Your foot should get closer to the floor. Do not lean or bend your knees. Repeat __________ times. Complete this exercise __________ times a day. This information is not intended to replace advice given to you by your health care provider. Make sure you discuss any questions you have with your health care provider. Document Revised: 07/10/2018 Document Reviewed: 01/08/2018 Elsevier Patient Education  2020 [employer]   Hip Bursitis Rehab Ask your health care provider which exercises are safe for you. Do exercises exactly as told by your health care provider and adjust them as directed. It is normal to feel mild stretching, pulling, tightness, or discomfort as you do these exercises. Stop right away if you feel sudden pain or your pain gets worse. Do not begin these exercises until told by your health care provider. Stretching exercise This exercise warms up your muscles and joints and improves the movement and flexibility of your hip. This exercise also helps to relieve pain and stiffness. Iliotibial band stretch An iliotibial band is a strong band of muscle tissue that runs from the outer side of your hip to the outer side of your thigh and knee. 1. Lie on your side with your left / right leg in the top position. 2. Bend your left / right knee and grab your ankle. Stretch out your bottom arm to help you balance. 3. Slowly bring your knee back so your thigh is behind your body. 4. Slowly lower your knee toward the floor until you feel a gentle stretch on the outside of your left / right thigh. If you do not feel a stretch and your knee will not fall farther, place the heel of your other foot on top of your knee and pull your knee down toward the floor with your foot. 5. Hold this position for __________ seconds. 6. Slowly  return to the starting position. Repeat __________ times. Complete this exercise __________ times a day. Strengthening exercises These exercises build strength and endurance in your hip and pelvis. Endurance is the ability to use your muscles for a long time, even after they get tired. Bridge This exercise strengthens the muscles that move your thigh backward (hip extensors). 1. Lie on your back on a firm surface with your knees bent and your feet flat on the floor. 2. Tighten your buttocks muscles and lift your buttocks off the floor until your trunk is level with your thighs. ? Do not arch your back. ? You should feel the muscles working in your buttocks and the back of your thighs. If you do not feel these muscles, slide your feet 1-2 inches (2.5-5 cm) farther away from your buttocks. ? If this exercise is too easy, try doing it with your arms crossed   over your chest. 3. Hold this position for __________ seconds. 4. Slowly lower your hips to the starting position. 5. Let your muscles relax completely after each repetition. Repeat __________ times. Complete this exercise __________ times a day. Squats This exercise strengthens the muscles in front of your thigh and knee (quadriceps). 1. Stand in front of a table, with your feet and knees pointing straight ahead. You may rest your hands on the table for balance but not for support. 2. Slowly bend your knees and lower your hips like you are going to sit in a chair. ? Keep your weight over your heels, not over your toes. ? Keep your lower legs upright so they are parallel with the table legs. ? Do not let your hips go lower than your knees. ? Do not bend lower than told by your health care provider. ? If your hip pain increases, do not bend as low. 3. Hold the squat position for __________ seconds. 4. Slowly push with your legs to return to standing. Do not use your hands to pull yourself to standing. Repeat __________ times. Complete this  exercise __________ times a day. Hip hike 1. Stand sideways on a bottom step. Stand on your left / right leg with your other foot unsupported next to the step. You can hold on to the railing or wall for balance if needed. 2. Keep your knees straight and your torso square. Then lift your left / right hip up toward the ceiling. 3. Hold this position for __________ seconds. 4. Slowly let your left / right hip lower toward the floor, past the starting position. Your foot should get closer to the floor. Do not lean or bend your knees. Repeat __________ times. Complete this exercise __________ times a day. Single leg stand 1. Without shoes, stand near a railing or in a doorway. You may hold on to the railing or door frame as needed for balance. 2. Squeeze your left / right buttock muscles, then lift up your other foot. ? Do not let your left / right hip push out to the side. ? It is helpful to stand in front of a mirror for this exercise so you can watch your hip. 3. Hold this position for __________ seconds. Repeat __________ times. Complete this exercise __________ times a day. This information is not intended to replace advice given to you by your health care provider. Make sure you discuss any questions you have with your health care provider. Document Revised: 07/14/2018 Document Reviewed: 07/14/2018 Elsevier Patient Education  2020 [employer]  

## 2019-05-05 ENCOUNTER — Encounter: Payer: Self-pay | Admitting: Orthopaedic Surgery

## 2019-05-05 ENCOUNTER — Ambulatory Visit (INDEPENDENT_AMBULATORY_CARE_PROVIDER_SITE_OTHER): Payer: Medicare Other

## 2019-05-05 ENCOUNTER — Ambulatory Visit (INDEPENDENT_AMBULATORY_CARE_PROVIDER_SITE_OTHER): Payer: Medicare Other | Admitting: Orthopaedic Surgery

## 2019-05-05 VITALS — Ht 65.5 in | Wt 119.0 lb

## 2019-05-05 DIAGNOSIS — G8929 Other chronic pain: Secondary | ICD-10-CM

## 2019-05-05 DIAGNOSIS — M4726 Other spondylosis with radiculopathy, lumbar region: Secondary | ICD-10-CM | POA: Diagnosis not present

## 2019-05-05 DIAGNOSIS — M545 Low back pain: Secondary | ICD-10-CM

## 2019-05-05 DIAGNOSIS — M79652 Pain in left thigh: Secondary | ICD-10-CM | POA: Diagnosis not present

## 2019-05-05 DIAGNOSIS — M47816 Spondylosis without myelopathy or radiculopathy, lumbar region: Secondary | ICD-10-CM | POA: Insufficient documentation

## 2019-05-05 MED ORDER — METHYLPREDNISOLONE 4 MG PO TBPK: ORAL_TABLET | ORAL | 0 refills | Status: DC

## 2019-05-05 NOTE — Progress Notes (Signed)
Office Visit Note   Patient: Danielle Byrd           Date of Birth: Jul 04, 1955           MRN: [id number] Visit Date: 05/05/2019              Requested by: Allie Dimmer, [place] [address] PCP: Allie Dimmer, MD   Assessment & Plan: Visit Diagnoses:  1. Chronic low back pain, unspecified back pain laterality, unspecified whether sciatica present   2. Left thigh pain   3. Osteoarthritis of spine with radiculopathy, lumbar region     Plan: Chronic left thigh pain since Danielle Byrd fell in May.  She did have a hematoma eventually resolved.  She had a recurrence of acute pain in October without any injury or trauma.  She had exacerbation for "no apparent reason" about 3 weeks ago.  She has been using heat and a "wrap".  She also relates having been diagnosed with arthritis and stenosis of the lumbar spine years ago in [place].  Pain seems to be worse when she is up and about and is consistent with spinal claudication.  I think her problem might arise from the lumbar spine will order an MRI scan.  Medrol Dosepak for pain. total office visit about 45 minutes 50% of the time in counseling  Follow-Up Instructions: Return After MRI lumbar spine.   Orders:  Orders Placed This Encounter  Procedures  . XR Lumbar Spine 2-3 Views  . MR Lumbar Spine w/o contrast   Meds ordered this encounter  Medications  . methylPREDNISolone (MEDROL DOSEPAK) 4 MG TBPK tablet    Sig: Take as directed on package    Dispense:  21 tablet    Refill:  0      Procedures: No procedures performed   Clinical Data: No additional findings.   Subjective: Chief Complaint  Patient presents with  . Left Leg - Pain  Patient fell in May of 2020 and developed a large hematoma in her thigh. She said it took many weeks to go away. In October of last year she was driving and had pain in her thigh that went away. She said that her pain has returned in the last three weeks. She  said that her pain is worse with walking or taking stairs up or down. She is taking tylenol for pain. She saw Danielle Byrd yesterday and had x-rays taken. Her pain seems to stay in her thigh.  I reviewed the films of her left femur did not see any evidence of acute injury, arthritis of the left hip joint or other bony abnormality. Danielle Byrd has a history of fibromyalgia and chronic neck and back pain.  She does use a TENS unit. Her present thigh pain seems to be is worse when she is up and about and less active or painful when she is at rest.  The longer she is up and about her walking the more thigh pain she will experience.  No trouble on the right.  No pain below the right knee.  No groin pain or lateral left hip pain HPI  Review of Systems   Objective: Vital Signs: Ht 5' 5.5" (1.664 m)   Wt 119 lb (54 kg)   BMI 19.50 kg/m   Physical Exam Constitutional:      Appearance: She is well-developed.  Eyes:     Pupils: Pupils are equal, round, and reactive to light.  Pulmonary:  Effort: Pulmonary effort is normal.  Skin:    General: Skin is warm and dry.  Neurological:     Mental Status: She is alert and oriented to person, place, and time.  Psychiatric:        Behavior: Behavior normal.     Ortho Exam awake alert and oriented x3.  Comfortable's sitting.  Did sob some during the office visit related to her chronic pain.  Straight leg raise was negative on the left.  Painless range of motion of left hip.  No groin pain.  No thigh discomfort.  Presently using a wrap on her left thigh but no direct tenderness in the mid thigh or the lateral aspect of her hip.  Does have some percussible tenderness of the lumbar spine rather diffusely in.  Multiple areas of trigger point tenderness.  Presently using a TENS unit with multiple pads along her lumbar spine.  No distal edema.  Motor exam intact  Specialty Comments:  No specialty comments available.  Imaging: XR Lumbar Spine 2-3  Views  Result Date: 05/05/2019 Films of the lumbar spine were obtained in several projections.  There is a slight left degenerative scoliosis.  There is significant degenerative disc disease at L5-S1 with disc space narrowing.  Slight anterior listhesis of L4 on L5.  Facet sclerosis at L4-5 and L5-S1.  No fractures identified    PMFS History: Patient Active Problem List   Diagnosis Date Noted  . Left thigh pain 05/05/2019  . 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 Hx of colonic polyps 09/05/2017  . Family hx of colon cancer 09/05/2017  . Migraine without aura and without status migrainosus, not intractable 03/04/2017  . Fibromyalgia 11/22/2016  . Other fatigue 11/22/2016  . DDD (degenerative disc disease), cervical 11/22/2016  . Primary osteoarthritis of both hands 11/22/2016  . History of migraine 11/22/2016  . History of anxiety 11/22/2016  . History of hypothyroidism 11/22/2016  . History of kidney stones 11/22/2016  . History of cholecystectomy 11/22/2016  . History of anemia 11/22/2016  . Hx of bone density study/ normal 2014 this is followed by her Primary Care  11/22/2016  . Sicca syndrome ([place]) 11/22/2016  . 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 Headache(784.0) 10/27/2013  . Leukopenia 07/08/2013  . Anemia 10/31/2011  . Iron deficiency 10/31/2011  . GERD (gastroesophageal reflux disease) 05/14/2011  . Abdominal pain, bilateral upper quadrant 05/14/2011  . H/O fibromyalgia 05/14/2011  . Constipation 05/14/2011  . Hyperlipemia 05/14/2011  . Kidney stones 01/11/2011   Past Medical History:  Diagnosis Date  . Anemia   . Anxiety   . Arthritis   . Cervical stenosis (uterine cervix)   . Closed fracture of left foot   . Constipation   . Copper deficiency   . Depression   . Fibromyalgia   . GERD (gastroesophageal reflux disease)   . High cholesterol   . History of kidney stones   . Hyperlipidemia   . Hypothyroidism   . Kidney stones   . Lumbar stenosis  11/27/2016  . Melanosis coli   . Migraines   . Nephrolithiasis   . Panic attack   . Rosacea   . Sliding hiatal hernia   . Spondylosis, cervical     Family History  Problem Relation Age of Onset  . Diabetes Mother   . Hypertension Mother   . Anemia Mother   . Colon cancer Father   . Cancer Father 29       colon cancer  . Heart attack  Father     Past Surgical History:  Procedure Laterality Date  . ABDOMINAL HYSTERECTOMY    . BONE MARROW BIOPSY     & aspirate  . BREAST SURGERY Right    partial mastectomy  . CHOLECYSTECTOMY    . COLONOSCOPY N/A 07/22/2012   Procedure: COLONOSCOPY;  Surgeon: [name], MD;  [facility];  Service: Endoscopy;  Laterality: N/A;  730  . COLONOSCOPY WITH PROPOFOL N/A 10/25/2017   Procedure: COLONOSCOPY WITH PROPOFOL;  Surgeon: [name], MD;  [facility];  Service: Endoscopy;  Laterality: N/A;  10:30  . DILATION AND CURETTAGE OF UTERUS    . EYE SURGERY     sty removal and reconstruction of eye lids.  Marland Kitchen NISSEN FUNDOPLICATION    . POLYPECTOMY  10/25/2017   Procedure: POLYPECTOMY;  Surgeon: [name], MD;  [facility];  Service: Endoscopy;;  sigmoid    Social History   Occupational History    Employer: [employer]    Comment: no longer  Tobacco Use  . Smoking status: Never Smoker  . Smokeless tobacco: Never Used  Substance and Sexual Activity  . Alcohol use: No    Alcohol/week: 0.0 standard drinks  . Drug use: No  . Sexual activity: Never    Birth control/protection: Surgical

## 2019-05-06 NOTE — Telephone Encounter (Signed)
Opened in error

## 2019-05-07 DIAGNOSIS — M797 Fibromyalgia: Secondary | ICD-10-CM | POA: Diagnosis not present

## 2019-05-07 DIAGNOSIS — D510 Vitamin B12 deficiency anemia due to intrinsic factor deficiency: Secondary | ICD-10-CM | POA: Diagnosis not present

## 2019-05-07 DIAGNOSIS — E559 Vitamin D deficiency, unspecified: Secondary | ICD-10-CM | POA: Diagnosis not present

## 2019-05-07 DIAGNOSIS — Z79899 Other long term (current) drug therapy: Secondary | ICD-10-CM | POA: Diagnosis not present

## 2019-05-07 DIAGNOSIS — F329 Major depressive disorder, single episode, unspecified: Secondary | ICD-10-CM | POA: Diagnosis not present

## 2019-05-07 DIAGNOSIS — D72819 Decreased white blood cell count, unspecified: Secondary | ICD-10-CM | POA: Diagnosis not present

## 2019-05-07 DIAGNOSIS — E782 Mixed hyperlipidemia: Secondary | ICD-10-CM | POA: Diagnosis not present

## 2019-05-07 DIAGNOSIS — D500 Iron deficiency anemia secondary to blood loss (chronic): Secondary | ICD-10-CM | POA: Diagnosis not present

## 2019-05-07 DIAGNOSIS — E039 Hypothyroidism, unspecified: Secondary | ICD-10-CM | POA: Diagnosis not present

## 2019-05-07 DIAGNOSIS — F419 Anxiety disorder, unspecified: Secondary | ICD-10-CM | POA: Diagnosis not present

## 2019-05-11 ENCOUNTER — Telehealth: Payer: Self-pay | Admitting: Orthopaedic Surgery

## 2019-05-11 NOTE — Telephone Encounter (Signed)
Patient called  She wants a copy of her AVS

## 2019-05-21 ENCOUNTER — Other Ambulatory Visit: Payer: Medicare Other

## 2019-05-25 DIAGNOSIS — E871 Hypo-osmolality and hyponatremia: Secondary | ICD-10-CM | POA: Diagnosis not present

## 2019-06-02 ENCOUNTER — Ambulatory Visit: Payer: Medicare Other | Admitting: Orthopaedic Surgery

## 2019-06-06 ENCOUNTER — Ambulatory Visit
Admission: RE | Admit: 2019-06-06 | Discharge: 2019-06-06 | Disposition: A | Discharge status: Home / Self Care | Payer: Medicare Other | Source: Ambulatory Visit | Attending: Orthopaedic Surgery | Admitting: Orthopaedic Surgery

## 2019-06-06 ENCOUNTER — Other Ambulatory Visit: Payer: Self-pay

## 2019-06-06 DIAGNOSIS — M47816 Spondylosis without myelopathy or radiculopathy, lumbar region: Secondary | ICD-10-CM | POA: Diagnosis not present

## 2019-06-06 DIAGNOSIS — G8929 Other chronic pain: Secondary | ICD-10-CM

## 2019-06-06 DIAGNOSIS — M5126 Other intervertebral disc displacement, lumbar region: Secondary | ICD-10-CM | POA: Diagnosis not present

## 2019-06-11 ENCOUNTER — Encounter: Payer: Self-pay | Admitting: Orthopaedic Surgery

## 2019-06-11 ENCOUNTER — Other Ambulatory Visit: Payer: Self-pay

## 2019-06-11 ENCOUNTER — Ambulatory Visit (INDEPENDENT_AMBULATORY_CARE_PROVIDER_SITE_OTHER): Payer: Medicare Other | Admitting: Orthopaedic Surgery

## 2019-06-11 VITALS — Ht 65.5 in | Wt 119.0 lb

## 2019-06-11 DIAGNOSIS — M79652 Pain in left thigh: Secondary | ICD-10-CM | POA: Diagnosis not present

## 2019-06-11 NOTE — Progress Notes (Signed)
Office Visit Note   Patient: Danielle Byrd           Date of Birth: 1955/12/26           MRN: [id number] Visit Date: 06/11/2019              Requested by: Allie Dimmer, [place] [address] PCP: Allie Dimmer, MD   Assessment & Plan: Visit Diagnoses:  1. Left thigh pain     Plan: MRI scan of the lumbar spine reveals some degenerative changes but no evidence of foraminal or central stenosis.  There was no obvious etiology of the left thigh pain based on the lumbar scan.  Danielle Byrd relates that she has been having trouble off and on for about a year when she fell.  She developed a hematoma in that thigh.  In addition she has issues with her balance and does use a walker but only for the past several months.  She uses a walker because of a combination of balance and her left thigh pain.  Prior x-rays of her left hemipelvis and femur were negative.  I did not see any arthritis of her hip.  She still having tenderness along the proximal lateral aspect of her left thigh.  No bruising.  No obvious masses.  She did not have any knee pain or any distal edema neurologically was intact.  She does have a history of fibromyalgia which is quite active and does have a history of both cervical and lumbar chronic pain.  It certainly could be that her chronic pain is related to her fibromyalgia with minimal pathology but I think it is her obtaining an MRI scan of her thigh just to be sure.  She certainly seems to have pain out of proportion to what I am finding by exam but I want to be sure that I am not missing some pathology  Follow-Up Instructions: Return After MRI scan left thigh.   Orders:  Orders Placed This Encounter  Procedures  . MR FEMUR LEFT WO CONTRAST   No orders of the defined types were placed in this encounter.     Procedures: No procedures performed   Clinical Data: No additional findings.   Subjective: Chief Complaint  Patient presents  with  . Lower Back - Follow-up    MRI results  Patient presents today for follow up on her lower back. She had an MRI of her L-Spine and is here today to discuss those results. Patient states that her pain has increased since her last visit. She uses Tylenol, Lidocaine Patches, muscle relaxers, and heat. She walks with a walker for assistance in ambulating.  Has chronic history of neck and back problems as well as fibromyalgia.  Has been using a walker for the last several months for a combination of balance and her left thigh pain.  HPI  Review of Systems   Objective: Vital Signs: Ht 5' 5.5" (1.664 m)   Wt 119 lb (54 kg)   BMI 19.50 kg/m   Physical Exam Constitutional:      Appearance: She is well-developed.  Eyes:     Pupils: Pupils are equal, round, and reactive to light.  Pulmonary:     Effort: Pulmonary effort is normal.  Skin:    General: Skin is warm and dry.  Neurological:     Mental Status: She is alert and oriented to person, place, and time.  Psychiatric:        Behavior:  Behavior normal.     Ortho Exam straight leg raise was negative bilaterally motor exam was intact.  On occasion Danielle Byrd relates that in the morning she may have some tingling in her feet but not at the present time.  +1 pulses.  Painless range of motion of both hips with internal and external rotation.  Left knee with full extension and flexion over 100 degrees without instability no localized areas of tenderness.  There are some areas of tenderness about the proximal left thigh more lateral but somewhat anteriorly.  This is in the area where she had a prior hematoma.  I did not feel any gaps in the muscle or any obvious masses. she did not have pain over the greater trochanter  Specialty Comments:  No specialty comments available.  Imaging: No results found.   PMFS History: Patient Active Problem List   Diagnosis Date Noted  . Left thigh pain 05/05/2019  . Osteoarthritis of lumbar spine  05/05/2019  . Hx of colonic polyps 09/05/2017  . Family hx of colon cancer 09/05/2017  . Migraine without aura and without status migrainosus, not intractable 03/04/2017  . Fibromyalgia 11/22/2016  . Other fatigue 11/22/2016  . DDD (degenerative disc disease), cervical 11/22/2016  . Primary osteoarthritis of both hands 11/22/2016  . History of migraine 11/22/2016  . History of anxiety 11/22/2016  . History of hypothyroidism 11/22/2016  . History of kidney stones 11/22/2016  . History of cholecystectomy 11/22/2016  . History of anemia 11/22/2016  . Hx of bone density study/ normal 2014 this is followed by her Primary Care  11/22/2016  . Sicca syndrome ([place]) 11/22/2016  . 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 Headache(784.0) 10/27/2013  . Leukopenia 07/08/2013  . Anemia 10/31/2011  . Iron deficiency 10/31/2011  . GERD (gastroesophageal reflux disease) 05/14/2011  . Abdominal pain, bilateral upper quadrant 05/14/2011  . H/O fibromyalgia 05/14/2011  . Constipation 05/14/2011  . Hyperlipemia 05/14/2011  . Kidney stones 01/11/2011   Past Medical History:  Diagnosis Date  . Anemia   . Anxiety   . Arthritis   . Cervical stenosis (uterine cervix)   . Closed fracture of left foot   . Constipation   . Copper deficiency   . Depression   . Fibromyalgia   . GERD (gastroesophageal reflux disease)   . High cholesterol   . History of kidney stones   . Hyperlipidemia   . Hypothyroidism   . Kidney stones   . Lumbar stenosis 11/27/2016  . Melanosis coli   . Migraines   . Nephrolithiasis   . Panic attack   . Rosacea   . Sliding hiatal hernia   . Spondylosis, cervical     Family History  Problem Relation Age of Onset  . Diabetes Mother   . Hypertension Mother   . Anemia Mother   . Colon cancer Father   . Cancer Father 30       colon cancer  . Heart attack Father     Past Surgical History:  Procedure Laterality Date  . ABDOMINAL HYSTERECTOMY    . BONE MARROW BIOPSY     &  aspirate  . BREAST SURGERY Right    partial mastectomy  . CHOLECYSTECTOMY    . COLONOSCOPY N/A 07/22/2012   Procedure: COLONOSCOPY;  Surgeon: [name], MD;  [facility];  Service: Endoscopy;  Laterality: N/A;  730  . COLONOSCOPY WITH PROPOFOL N/A 10/25/2017   Procedure: COLONOSCOPY WITH PROPOFOL;  Surgeon: [name], MD;  [facility];  Service: Endoscopy;  Laterality: N/A;  10:30  . DILATION AND CURETTAGE OF UTERUS    . EYE SURGERY     sty removal and reconstruction of eye lids.  Marland Kitchen NISSEN FUNDOPLICATION    . POLYPECTOMY  10/25/2017   Procedure: POLYPECTOMY;  Surgeon: [name], MD;  [facility];  Service: Endoscopy;;  sigmoid    Social History   Occupational History    Employer: [employer]    Comment: no longer  Tobacco Use  . Smoking status: Never Smoker  . Smokeless tobacco: Never Used  Substance and Sexual Activity  . Alcohol use: No    Alcohol/week: 0.0 standard drinks  . Drug use: No  . Sexual activity: Never    Birth control/protection: Surgical

## 2019-06-17 ENCOUNTER — Ambulatory Visit: Payer: Medicare Other | Admitting: Physician Assistant

## 2019-07-15 ENCOUNTER — Other Ambulatory Visit: Payer: Self-pay

## 2019-07-15 ENCOUNTER — Ambulatory Visit
Admission: RE | Admit: 2019-07-15 | Discharge: 2019-07-15 | Disposition: A | Discharge status: Home / Self Care | Payer: Medicare Other | Source: Ambulatory Visit | Attending: Orthopaedic Surgery | Admitting: Orthopaedic Surgery

## 2019-07-15 DIAGNOSIS — M79652 Pain in left thigh: Secondary | ICD-10-CM | POA: Diagnosis not present

## 2019-07-21 ENCOUNTER — Ambulatory Visit (INDEPENDENT_AMBULATORY_CARE_PROVIDER_SITE_OTHER): Payer: Medicare Other | Admitting: Orthopaedic Surgery

## 2019-07-21 ENCOUNTER — Other Ambulatory Visit: Payer: Self-pay

## 2019-07-21 ENCOUNTER — Encounter: Payer: Self-pay | Admitting: Orthopaedic Surgery

## 2019-07-21 VITALS — Ht 65.5 in | Wt 119.0 lb

## 2019-07-21 DIAGNOSIS — M79652 Pain in left thigh: Secondary | ICD-10-CM

## 2019-07-21 NOTE — Progress Notes (Signed)
Office Visit Note   Patient: Danielle Byrd           Date of Birth: [birth year]           MRN: [id number] Visit Date: 07/21/2019              Requested by: Allie Dimmer, [place] [place] [address] PCP: Allie Dimmer, MD   Assessment & Plan: Visit Diagnoses:  1. Left thigh pain     Plan: As previously outlined Danielle Byrd has had this chronic pain along the proximal anterior lateral left thigh.  I was concerned that the pain may be referred from her lumbar spine.  An MRI scan demonstrated some degenerative changes but no obvious stenosis.  Danielle Byrd just had an MRI scan of her thigh that was essentially benign with no significant abnormal muscle edema or impingement along the visualized sciatic nerve.  The proximal hamstring tendons are unremarkable.  No significant muscular asymmetry observed on the coronal images.  No abnormal subcutaneous edema along the left thigh region.  No abnormal osseous edema was identified in the visualized portion of the left femur and no periostitis.  There was a small enchondroma in the distal femoral metaphysis where she was not symptomatic.  In essence the left femur appeared normal visualized and without explanation for patient's symptoms.  Prior films of her pelvis were negative for any obvious arthritis.  She was not experiencing groin pain or anterior thigh pain Danielle Byrd has a history of fibromyalgia which is quite active.  She has been followed by Dr. Estanislado Pandy and has tried Lyrica, gabapentin and other medicines for fibromyalgia which are either not effective or to which she was allergic.  She also has a TENS unit at home but notes that it will not "stick to my skin when I sweat" at this point I am not sure if there is any identifiable lesion that would be causing her pain.  I would like Dr. Romona Curls input to see if he has any further ideas about further scanning or if he thought some local injection would be worthwhile.  I  thought she might even have meralgia paresthetica but I am not sure she is having the symptoms  Follow-Up Instructions: Return Refer to Dr. Ernestina Patches for.   Orders:  Orders Placed This Encounter  Procedures  . Ambulatory referral to Physical Medicine Rehab   No orders of the defined types were placed in this encounter.     Procedures: No procedures performed   Clinical Data: No additional findings.   Subjective: Chief Complaint  Patient presents with  . Left Leg - Follow-up    MRI review  Patient presents today for follow up on her left leg. She had an MRI on 07/15/2019 and is here today for those results.  HPI  Review of Systems   Objective: Vital Signs: Ht 5' 5.5" (1.664 m)   Wt 119 lb (54 kg)   BMI 19.50 kg/m   Physical Exam Constitutional:      Appearance: She is well-developed.  Eyes:     Pupils: Pupils are equal, round, and reactive to light.  Pulmonary:     Effort: Pulmonary effort is normal.  Skin:    General: Skin is warm and dry.  Neurological:     Mental Status: She is alert and oriented to person, place, and time.  Psychiatric:        Behavior: Behavior normal.     Ortho Exam awake alert  and oriented x3.  Wearing a face shield and mask.  No pain of the left distal thigh or knee.  Painless range of motion of her left hip with internal and external rotation.  There was some discomfort along the anterior lateral proximal thigh but not over the greater trochanter or over the anterior superior iliac spine.  No percussible back pain.  No related abdominal complaints today  Specialty Comments:  No specialty comments available.  Imaging: No results found.   PMFS History: Patient Active Problem List   Diagnosis Date Noted  . Left thigh pain 05/05/2019  . 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 Hx of colonic polyps 09/05/2017  . Family hx of colon cancer 09/05/2017  . Migraine without aura and without status migrainosus, not intractable  03/04/2017  . Fibromyalgia 11/22/2016  . Other fatigue 11/22/2016  . DDD (degenerative disc disease), cervical 11/22/2016  . Primary osteoarthritis of both hands 11/22/2016  . History of migraine 11/22/2016  . History of anxiety 11/22/2016  . History of hypothyroidism 11/22/2016  . History of kidney stones 11/22/2016  . History of cholecystectomy 11/22/2016  . History of anemia 11/22/2016  . Hx of bone density study/ normal 2014 this is followed by her Primary Care  11/22/2016  . Sicca syndrome ([place]) 11/22/2016  . 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 Headache(784.0) 10/27/2013  . Leukopenia 07/08/2013  . Anemia 10/31/2011  . Iron deficiency 10/31/2011  . GERD (gastroesophageal reflux disease) 05/14/2011  . Abdominal pain, bilateral upper quadrant 05/14/2011  . H/O fibromyalgia 05/14/2011  . Constipation 05/14/2011  . Hyperlipemia 05/14/2011  . Kidney stones 01/11/2011   Past Medical History:  Diagnosis Date  . Anemia   . Anxiety   . Arthritis   . Cervical stenosis (uterine cervix)   . Closed fracture of left foot   . Constipation   . Copper deficiency   . Depression   . Fibromyalgia   . GERD (gastroesophageal reflux disease)   . High cholesterol   . History of kidney stones   . Hyperlipidemia   . Hypothyroidism   . Kidney stones   . Lumbar stenosis 11/27/2016  . Melanosis coli   . Migraines   . Nephrolithiasis   . Panic attack   . Rosacea   . Sliding hiatal hernia   . Spondylosis, cervical     Family History  Problem Relation Age of Onset  . Diabetes Mother   . Hypertension Mother   . Anemia Mother   . Colon cancer Father   . Cancer Father 30       colon cancer  . Heart attack Father     Past Surgical History:  Procedure Laterality Date  . ABDOMINAL HYSTERECTOMY    . BONE MARROW BIOPSY     & aspirate  . BREAST SURGERY Right    partial mastectomy  . CHOLECYSTECTOMY    . COLONOSCOPY N/A 07/22/2012   Procedure: COLONOSCOPY;  Surgeon: [name],  MD;  [facility];  Service: Endoscopy;  Laterality: N/A;  730  . COLONOSCOPY WITH PROPOFOL N/A 10/25/2017   Procedure: COLONOSCOPY WITH PROPOFOL;  Surgeon: [name], MD;  [facility];  Service: Endoscopy;  Laterality: N/A;  10:30  . DILATION AND CURETTAGE OF UTERUS    . EYE SURGERY     sty removal and reconstruction of eye lids.  Marland Kitchen NISSEN FUNDOPLICATION    . POLYPECTOMY  10/25/2017   Procedure: POLYPECTOMY;  Surgeon: [name], MD;  [facility];  Service: Endoscopy;;  sigmoid    Social History   Occupational History    Employer: [employer]    Comment: no longer  Tobacco Use  . Smoking status: Never Smoker  . Smokeless tobacco: Never Used  Substance and Sexual Activity  . Alcohol use: No    Alcohol/week: 0.0 standard drinks  . Drug use: No  . Sexual activity: Never    Birth control/protection: Surgical

## 2019-08-20 ENCOUNTER — Other Ambulatory Visit: Payer: Self-pay

## 2019-08-20 ENCOUNTER — Encounter: Payer: Self-pay | Admitting: Physical Medicine and Rehabilitation

## 2019-08-20 ENCOUNTER — Ambulatory Visit (INDEPENDENT_AMBULATORY_CARE_PROVIDER_SITE_OTHER): Payer: Medicare Other | Admitting: Physical Medicine and Rehabilitation

## 2019-08-20 VITALS — BP 133/75 | HR 69 | Ht 65.5 in | Wt 120.0 lb

## 2019-08-20 DIAGNOSIS — M79652 Pain in left thigh: Secondary | ICD-10-CM

## 2019-08-20 DIAGNOSIS — M797 Fibromyalgia: Secondary | ICD-10-CM

## 2019-08-20 DIAGNOSIS — G8929 Other chronic pain: Secondary | ICD-10-CM | POA: Diagnosis not present

## 2019-08-20 DIAGNOSIS — M25552 Pain in left hip: Secondary | ICD-10-CM

## 2019-08-20 DIAGNOSIS — M545 Low back pain: Secondary | ICD-10-CM

## 2019-08-20 NOTE — Progress Notes (Signed)
Pt states pain across the lower back that radiates into the buttocks and into the outside of the left thigh. Pt states pain started October 2020. Pt states excessive activities makes pain worse. Sitting with both feet elevated helps with pain.   .Numeric Pain Rating Scale and Functional Assessment Average Pain 7 Pain Right Now 7 My pain is constant, dull and stabbing Pain is worse with: walking, standing and some activites Pain improves with: rest   In the last MONTH (on 0-10 scale) has pain interfered with the following?  1. General activity like being  able to carry out your everyday physical activities such as walking, climbing stairs, carrying groceries, or moving a chair?  Rating(8)  2. Relation with others like being able to carry out your usual social activities and roles such as  activities at home, at work and in your community. Rating(8)  3. Enjoyment of life such that you have  been bothered by emotional problems such as feeling anxious, depressed or irritable?  Rating(4)

## 2019-08-21 ENCOUNTER — Encounter: Payer: Self-pay | Admitting: Physical Medicine and Rehabilitation

## 2019-08-21 NOTE — Progress Notes (Signed)
Danielle Byrd - 64 y.o. female MRN [id number]  Date of birth: 06-02-1955  Office Visit Note: Visit Date: 08/20/2019 PCP: Allie Dimmer, MD Referred by: Allie Dimmer, MD  Subjective: Chief Complaint  Patient presents with  . Lower Back - Pain  . Left Thigh - Pain   HPI: Danielle Byrd is a 64 y.o. female who comes in today At the request of Dr. Joni Fears for evaluation and potential management of left thigh pain which has been chronic and severe since October 2020.  Evidently there was a fall that happened in May 2020 that may have started the exacerbation.  I did review Dr. Rudene Anda notes and per the patient's report it looks like there is been extensive work-up of the lower back and left femur as well as ongoing treatment of her symptoms.  We did review MRI of the lumbar spine and this is reviewed below and reviewed in detail with the patient with spine models and imaging.  I did review the MRI of the femur and although it was normal we did not look at that today.  She reports an average pain of 7 out of 10 that is constant worse first thing in the morning and worse with standing and doing activity.  Interestingly she does have significant fibromyalgia with multiple drug intolerances and ongoing active flares.  This is managed by Dr. Estanislado Pandy.  Along with this she describes this left thigh pain as lateral thigh pain from the trochanter to the knee.  She denies paresthesia or numbness.  She endorses this pain as twisting like someone is twisting the thigh and half as well as using the terms like someone taking a drill and drilling into the leg and then also uses some terms like taking a knife and stabbing into that area at times.  She has no pain on the right.  Some referral into the buttock area.  She does have ongoing back pain which has been chronic for many years.  She has been told that she had lumbar stenosis.  Initially Dr. Durward Fortes felt like this did seem consistent with a  lumbar spine issue with perhaps claudication from stenosis.  And again the MRI of the lumbar spine was performed.  I did note with the patient today that she has no lumbar stenosis whatsoever.  She does have mild facet arthropathy for her age particularly at L3-4 and L4-5.  She does have some degenerative disc height loss at L5-S1 but no specific nerve compression.  Dr. Durward Fortes also wondered about the diagnosis of meralgia paresthetica.  She has not had electrodiagnostic study.  Review of Systems  Musculoskeletal: Positive for back pain and joint pain.  All other systems reviewed and are negative.  Otherwise per HPI.  Assessment & Plan: Visit Diagnoses:  1. Chronic bilateral low back pain without sciatica   2. Pain in left hip   3. Pain in left thigh   4. Fibromyalgia     Plan: Findings:  Chronic constant severe and recalcitrant left buttock and lateral thigh pain with some ongoing back pain.  Relatively normal for age lumbar spine MRI showing some arthritic changes but no high-level stenosis or nerve compression.  On my review of the imaging there is very mild foraminal narrowing at L5-S1 but it is more left than right.  I discussed at length with her the diagnostic approach of potential left L5 transforaminal injection.  I also talked her at great length about meralgia paresthetica.  She really  has no reason to have that given her body size and no specific trauma.  She does not have any change in sensation on exam and so I do not think this is meralgia paresthetica.  I do think the underlying fibromyalgia at least is at some point modifying her pain complaints into this area and exacerbating what ever is causing the pain.  I did suggest that she could try a new TENS unit and we did show her on the Internet how to buy a fairly inexpensive TENS unit.  Her current TENS unit is very old.  We also talked about using a foam roller for massage although that is difficult at times with fibromyalgia but it  is something she could control the amount of force.  She will continue to use Voltaren gel.  At this point she will follow up with Dr. Joni Fears.  She really does not want to consider injection at this point.    Meds & Orders: No orders of the defined types were placed in this encounter.  No orders of the defined types were placed in this encounter.   Follow-up: Return for visit to requesting physician as needed.   Procedures: No procedures performed  No notes on file   Clinical History: No specialty comments available.   She reports that she has never smoked. She has never used smokeless tobacco. No results for input(s): HGBA1C, LABURIC in the last 8760 hours.  Objective:  VS:  HT:5' 5.5" (166.4 cm)   WT:120 lb (54.4 kg)  BMI:19.66    BP:133/75  HR:69bpm  TEMP: ( )  RESP:  Physical Exam Vitals and nursing note reviewed.  Constitutional:      General: She is not in acute distress.    Appearance: Normal appearance. She is well-developed.  HENT:     Head: Normocephalic and atraumatic.     Nose: Nose normal.     Mouth/Throat:     Mouth: Mucous membranes are moist.     Pharynx: Oropharynx is clear.  Eyes:     Conjunctiva/sclera: Conjunctivae normal.     Pupils: Pupils are equal, round, and reactive to light.  Cardiovascular:     Rate and Rhythm: Regular rhythm.  Pulmonary:     Effort: Pulmonary effort is normal. No respiratory distress.  Abdominal:     General: There is no distension.     Palpations: Abdomen is soft.     Tenderness: There is no guarding.  Musculoskeletal:     Cervical back: Normal range of motion and neck supple.     Right lower leg: No edema.     Left lower leg: No edema.     Comments: Patient ambulates without aid is somewhat slow to rise from a seated position to full extension.  She has no pain with hip rotation.  She has intact sensation in all dermatomes and peripheral nerve distributions bilaterally.  She has some pain over the left greater  trochanter but not exquisite.  She has normal strength in the lower extremities no clonus.  Skin:    General: Skin is warm and dry.     Findings: No erythema or rash.  Neurological:     General: No focal deficit present.     Mental Status: She is alert and oriented to person, place, and time.     Sensory: No sensory deficit.     Motor: No abnormal muscle tone.     Coordination: Coordination normal.     Gait: Gait normal.  Psychiatric:        Mood and Affect: Mood normal.        Behavior: Behavior normal.        Thought Content: Thought content normal.     Ortho Exam  Imaging: No results found.  Past Medical/Family/Surgical/Social History: Medications & Allergies reviewed per EMR, new medications updated. Patient Active Problem List   Diagnosis Date Noted  . Left thigh pain 05/05/2019  . 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 Hx of colonic polyps 09/05/2017  . Family hx of colon cancer 09/05/2017  . Migraine without aura and without status migrainosus, not intractable 03/04/2017  . Fibromyalgia 11/22/2016  . Other fatigue 11/22/2016  . DDD (degenerative disc disease), cervical 11/22/2016  . Primary osteoarthritis of both hands 11/22/2016  . History of migraine 11/22/2016  . History of anxiety 11/22/2016  . History of hypothyroidism 11/22/2016  . History of kidney stones 11/22/2016  . History of cholecystectomy 11/22/2016  . History of anemia 11/22/2016  . Hx of bone density study/ normal 2014 this is followed by her Primary Care  11/22/2016  . Sicca syndrome ([place]) 11/22/2016  . 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 Headache(784.0) 10/27/2013  . Leukopenia 07/08/2013  . Anemia 10/31/2011  . Iron deficiency 10/31/2011  . GERD (gastroesophageal reflux disease) 05/14/2011  . Abdominal pain, bilateral upper quadrant 05/14/2011  . H/O fibromyalgia 05/14/2011  . Constipation 05/14/2011  . Hyperlipemia 05/14/2011  . Kidney stones 01/11/2011   Past Medical History:    Diagnosis Date  . Anemia   . Anxiety   . Arthritis   . Cervical stenosis (uterine cervix)   . Closed fracture of left foot   . Constipation   . Copper deficiency   . Depression   . Fibromyalgia   . GERD (gastroesophageal reflux disease)   . High cholesterol   . History of kidney stones   . Hyperlipidemia   . Hypothyroidism   . Kidney stones   . Lumbar stenosis 11/27/2016  . Melanosis coli   . Migraines   . Nephrolithiasis   . Panic attack   . Rosacea   . Sliding hiatal hernia   . Spondylosis, cervical    Family History  Problem Relation Age of Onset  . Diabetes Mother   . Hypertension Mother   . Anemia Mother   . Colon cancer Father   . Cancer Father 48       colon cancer  . Heart attack Father    Past Surgical History:  Procedure Laterality Date  . ABDOMINAL HYSTERECTOMY    . BONE MARROW BIOPSY     & aspirate  . BREAST SURGERY Right    partial mastectomy  . CHOLECYSTECTOMY    . COLONOSCOPY N/A 07/22/2012   Procedure: COLONOSCOPY;  Surgeon: [name], MD;  Location: [facility];  Service: Endoscopy;  Laterality: N/A;  730  . COLONOSCOPY WITH PROPOFOL N/A 10/25/2017   Procedure: COLONOSCOPY WITH PROPOFOL;  Surgeon: [name], MD;  Location: [facility];  Service: Endoscopy;  Laterality: N/A;  10:30  . DILATION AND CURETTAGE OF UTERUS    . EYE SURGERY     sty removal and reconstruction of eye lids.  Marland Kitchen NISSEN FUNDOPLICATION    . POLYPECTOMY  10/25/2017   Procedure: POLYPECTOMY;  Surgeon: [name], MD;  Location: [facility];  Service: Endoscopy;;  sigmoid    Social History   Occupational History    Employer: [employer]    Comment: no  longer  Tobacco Use  . Smoking status: Never Smoker  . Smokeless tobacco: Never Used  Substance and Sexual Activity  . Alcohol use: No    Alcohol/week: 0.0 standard drinks  . Drug use: No  . Sexual activity: Never    Birth control/protection: Surgical

## 2019-09-03 ENCOUNTER — Encounter: Payer: Self-pay | Admitting: Physical Medicine and Rehabilitation

## 2019-09-03 ENCOUNTER — Other Ambulatory Visit: Payer: Self-pay

## 2019-09-03 ENCOUNTER — Ambulatory Visit: Payer: Self-pay

## 2019-09-03 ENCOUNTER — Ambulatory Visit (INDEPENDENT_AMBULATORY_CARE_PROVIDER_SITE_OTHER): Payer: Medicare Other | Admitting: Physical Medicine and Rehabilitation

## 2019-09-03 VITALS — BP 143/79 | HR 78

## 2019-09-03 DIAGNOSIS — M5416 Radiculopathy, lumbar region: Secondary | ICD-10-CM

## 2019-09-03 MED ORDER — METHYLPREDNISOLONE ACETATE 80 MG/ML IJ SUSP
40.0000 mg | Freq: Once | INTRAMUSCULAR | Status: AC
  Administered 2019-09-03: 40 mg

## 2019-09-03 NOTE — Progress Notes (Signed)
Pt states pain in the lower back and down the left thigh. Pt states no major changes since last visit.   .Numeric Pain Rating Scale and Functional Assessment Average Pain 7   In the last MONTH (on 0-10 scale) has pain interfered with the following?  1. General activity like being  able to carry out your everyday physical activities such as walking, climbing stairs, carrying groceries, or moving a chair?  Rating(7)   +Driver, -BT, -Dye Allergies.

## 2019-09-03 NOTE — Procedures (Signed)
Lumbosacral Transforaminal Epidural Steroid Injection - Sub-Pedicular Approach with Fluoroscopic Guidance  Patient: Danielle Byrd      Date of Birth: [birth year] MRN: [id number] PCP: Allie Dimmer, MD      Visit Date: 09/03/2019   Universal Protocol:    Date/Time: 09/03/2019  Consent Given By: the patient  Position: PRONE  Additional Comments: Vital signs were monitored before and after the procedure. Patient was prepped and draped in the usual sterile fashion. The correct patient, procedure, and site was verified.   Injection Procedure Details:  Procedure Site One Meds Administered:  Meds ordered this encounter  Medications  . methylPREDNISolone acetate (DEPO-MEDROL) injection 40 mg    Laterality: Left  Location/Site:  L5-S1  Needle size: 22 G  Needle type: Spinal  Needle Placement: Transforaminal  Findings:    -Comments: Excellent flow of contrast along the nerve and into the epidural space.  Procedure Details: After squaring off the end-plates to get a true AP view, the C-arm was positioned so that an oblique view of the foramen as noted above was visualized. The target area is just inferior to the "nose of the scotty dog" or sub pedicular. The soft tissues overlying this structure were infiltrated with 2-3 ml. of 1% Lidocaine without Epinephrine.  The spinal needle was inserted toward the target using a "trajectory" view along the fluoroscope beam.  Under AP and lateral visualization, the needle was advanced so it did not puncture dura and was located close the 6 O'Clock position of the pedical in AP tracterory. Biplanar projections were used to confirm position. Aspiration was confirmed to be negative for CSF and/or blood. A 1-2 ml. volume of Isovue-250 was injected and flow of contrast was noted at each level. Radiographs were obtained for documentation purposes.   After attaining the desired flow of contrast documented above, a 0.5 to 1.0 ml test dose of  0.25% Marcaine was injected into each respective transforaminal space.  The patient was observed for 90 seconds post injection.  After no sensory deficits were reported, and normal lower extremity motor function was noted,   the above injectate was administered so that equal amounts of the injectate were placed at each foramen (level) into the transforaminal epidural space.   Additional Comments:  The patient tolerated the procedure well Dressing: 2 x 2 sterile gauze and Band-Aid    Post-procedure details: Patient was observed during the procedure. Post-procedure instructions were reviewed.  Patient left the clinic in stable condition.

## 2019-09-03 NOTE — Progress Notes (Signed)
Danielle Byrd - 64 y.o. female MRN [id number]  Date of birth: 1955/07/05  Office Visit Note: Visit Date: 09/03/2019 PCP: Allie Dimmer, MD Referred by: Allie Dimmer, MD  Subjective: Chief Complaint  Patient presents with  . Lower Back - Pain  . Left Thigh - Pain   HPI:  Danielle Byrd is a 64 y.o. female who comes in today for planned Left L5-S1 lumbar transforaminal epidural steroid injection with fluoroscopic guidance.  The patient has failed conservative care including home exercise, medications, time and activity modification.  This injection will be diagnostic and hopefully therapeutic.  Please see requesting physician notes for further details and justification.   ROS Otherwise per HPI.  Assessment & Plan: Visit Diagnoses:  1. Lumbar radiculopathy     Plan: No additional findings.   Meds & Orders:  Meds ordered this encounter  Medications  . methylPREDNISolone acetate (DEPO-MEDROL) injection 40 mg    Orders Placed This Encounter  Procedures  . XR C-ARM NO REPORT  . Epidural Steroid injection    Follow-up: Return if symptoms worsen or fail to improve.   Procedures: No procedures performed  Lumbosacral Transforaminal Epidural Steroid Injection - Sub-Pedicular Approach with Fluoroscopic Guidance  Patient: Danielle Byrd      Date of Birth: Oct 18, 1955 MRN: [id number] PCP: Allie Dimmer, MD      Visit Date: 09/03/2019   Universal Protocol:    Date/Time: 09/03/2019  Consent Given By: the patient  Position: PRONE  Additional Comments: Vital signs were monitored before and after the procedure. Patient was prepped and draped in the usual sterile fashion. The correct patient, procedure, and site was verified.   Injection Procedure Details:  Procedure Site One Meds Administered:  Meds ordered this encounter  Medications  . methylPREDNISolone acetate (DEPO-MEDROL) injection 40 mg    Laterality: Left  Location/Site:  L5-S1  Needle size:  22 G  Needle type: Spinal  Needle Placement: Transforaminal  Findings:    -Comments: Excellent flow of contrast along the nerve and into the epidural space.  Procedure Details: After squaring off the end-plates to get a true AP view, the C-arm was positioned so that an oblique view of the foramen as noted above was visualized. The target area is just inferior to the "nose of the scotty dog" or sub pedicular. The soft tissues overlying this structure were infiltrated with 2-3 ml. of 1% Lidocaine without Epinephrine.  The spinal needle was inserted toward the target using a "trajectory" view along the fluoroscope beam.  Under AP and lateral visualization, the needle was advanced so it did not puncture dura and was located close the 6 O'Clock position of the pedical in AP tracterory. Biplanar projections were used to confirm position. Aspiration was confirmed to be negative for CSF and/or blood. A 1-2 ml. volume of Isovue-250 was injected and flow of contrast was noted at each level. Radiographs were obtained for documentation purposes.   After attaining the desired flow of contrast documented above, a 0.5 to 1.0 ml test dose of 0.25% Marcaine was injected into each respective transforaminal space.  The patient was observed for 90 seconds post injection.  After no sensory deficits were reported, and normal lower extremity motor function was noted,   the above injectate was administered so that equal amounts of the injectate were placed at each foramen (level) into the transforaminal epidural space.   Additional Comments:  The patient tolerated the procedure well Dressing: 2 x 2 sterile gauze and Band-Aid  Post-procedure details: Patient was observed during the procedure. Post-procedure instructions were reviewed.  Patient left the clinic in stable condition.      Clinical History: No specialty comments available.     Objective:  VS:  HT:    WT:   BMI:     BP:(!) 143/79   HR:78bpm  TEMP: ( )  RESP:  Physical Exam   Imaging: XR C-ARM NO REPORT  Result Date: 09/03/2019 Please see Notes tab for imaging impression.

## 2019-10-21 NOTE — Progress Notes (Signed)
Office Visit Note  Patient: Danielle Byrd             Date of Birth: 09/09/1955           MRN: [id number]             PCP: Allie Dimmer, MD Referring: Allie Dimmer, MD Visit Date: 11/03/2019 Occupation: _0 @  Subjective:  Generalized pain   History of Present Illness: Danielle Byrd is a 64 y.o. female with history of fibromyalgia, osteoarthritis, and DDD.  Patient reports that over the past 2 months has been experiencing frequent and severe fibromyalgia flares.  She is currently having generalized myalgias and muscle tenderness.  She is having trapezius muscle spasms bilaterally.  She requested trigger point injections today.  She has been taking Robaxin as needed for muscle spasms.  She continues to take turmeric, ginger, coenzyme Q 10, and vitamin D supplements as recommended. She uses voltaren gel topically as needed for pain relief.  She is also performing daily exercises several times per day which are helpful.  She reports that she had an epidural injection performed by Dr. Ernestina Patches on 09/03/2019 which provided significant relief but some of her discomfort has been returning.  She uses her TENS until every 2-3 weeks.  She continues to have chronic fatigue secondary to insomnia.  She takes trazodone 50 mg 1 tablet by mouth at bedtime to help her sleep.   Activities of Daily Living:  Patient reports joint stiffness all day  Patient Reports nocturnal pain.  Difficulty dressing/grooming: Denies Difficulty climbing stairs: Reports Difficulty getting out of chair: Reports Difficulty using hands for taps, buttons, cutlery, and/or writing: Reports  Review of Systems  Constitutional: Positive for fatigue.  HENT: Positive for mouth dryness and nose dryness. Negative for mouth sores.   Eyes: Positive for pain, itching and dryness.  Respiratory: Negative for shortness of breath and difficulty breathing.   Cardiovascular: Negative for chest pain and palpitations.    Gastrointestinal: Positive for constipation. Negative for blood in stool and diarrhea.  Endocrine: Negative for increased urination.  Genitourinary: Negative for difficulty urinating.  Musculoskeletal: Positive for arthralgias, joint pain, joint swelling, myalgias, morning stiffness, muscle tenderness and myalgias.  Skin: Negative for color change, rash and redness.  Allergic/Immunologic: Negative for susceptible to infections.  Neurological: Positive for headaches, memory loss and weakness. Negative for dizziness and numbness.  Hematological: Positive for bruising/bleeding tendency.  Psychiatric/Behavioral: Negative for confusion.    PMFS History:  Patient Active Problem List   Diagnosis Date Noted   Left thigh pain 05/05/2019   Osteoarthritis of lumbar spine 05/05/2019   Hx of colonic polyps 09/05/2017   Family hx of colon cancer 09/05/2017   Migraine without aura and without status migrainosus, not intractable 03/04/2017   Fibromyalgia 11/22/2016   Other fatigue 11/22/2016   DDD (degenerative disc disease), cervical 11/22/2016   Primary osteoarthritis of both hands 11/22/2016   History of migraine 11/22/2016   History of anxiety 11/22/2016   History of hypothyroidism 11/22/2016   History of kidney stones 11/22/2016   History of cholecystectomy 11/22/2016   History of anemia 11/22/2016   Hx of bone density study/ normal 2014 this is followed by her Primary Care  11/22/2016   Sicca syndrome ([place]) 11/22/2016   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   Headache(784.0) 10/27/2013   Leukopenia 07/08/2013   Anemia 10/31/2011   Iron deficiency 10/31/2011   GERD (gastroesophageal reflux disease) 05/14/2011   Abdominal pain, bilateral upper quadrant 05/14/2011   H/O  fibromyalgia 05/14/2011   Constipation 05/14/2011   Hyperlipemia 05/14/2011   Kidney stones 01/11/2011    Past Medical History:  Diagnosis Date   Anemia    Anxiety    Arthritis     Cervical stenosis (uterine cervix)    Closed fracture of left foot    Constipation    Copper deficiency    Depression    Fibromyalgia    GERD (gastroesophageal reflux disease)    High cholesterol    History of kidney stones    Hyperlipidemia    Hypothyroidism    Kidney stones    Lumbar stenosis 11/27/2016   Melanosis coli    Migraines    Nephrolithiasis    Panic attack    Rosacea    Sliding hiatal hernia    Spondylosis, cervical     Family History  Problem Relation Age of Onset   Diabetes Mother    Hypertension Mother    Anemia Mother    Colon cancer Father    Cancer Father 65       colon cancer   Heart attack Father    Past Surgical History:  Procedure Laterality Date   ABDOMINAL HYSTERECTOMY     BONE MARROW BIOPSY     & aspirate   BREAST SURGERY Right    partial mastectomy   CHOLECYSTECTOMY     COLONOSCOPY N/A 07/22/2012   Procedure: COLONOSCOPY;  Surgeon: [name], MD;  Location: [facility];  Service: Endoscopy;  Laterality: N/A;  730   COLONOSCOPY WITH PROPOFOL N/A 10/25/2017   Procedure: COLONOSCOPY WITH PROPOFOL;  Surgeon: [name], MD;  Location: [facility];  Service: Endoscopy;  Laterality: N/A;  10:30   DILATION AND CURETTAGE OF UTERUS     EYE SURGERY     sty removal and reconstruction of eye lids.   NISSEN FUNDOPLICATION     POLYPECTOMY  10/25/2017   Procedure: POLYPECTOMY;  Surgeon: [name], MD;  Location: [facility];  Service: Endoscopy;;  sigmoid    Social History   Social History Narrative   Patient lives at home with chacha (boxer, therapy dog)   Patient right handed   Patient has a BS degree.   Patient drinks 2 cups of tea daily.    There is no immunization history on file for this patient.   Objective: Vital Signs: BP 130/80 (BP Location: Left Arm, Patient Position: Sitting, Cuff Size: Normal)    Pulse 81    Resp 14    Ht 5' 5.5" (1.664 m)    Wt 116 lb 9.6 oz (52.9 kg)     BMI 19.11 kg/m    Physical Exam Vitals and nursing note reviewed.  Constitutional:      Appearance: She is well-developed.  HENT:     Head: Normocephalic and atraumatic.  Eyes:     Conjunctiva/sclera: Conjunctivae normal.  Pulmonary:     Effort: Pulmonary effort is normal.  Abdominal:     General: Bowel sounds are normal.     Palpations: Abdomen is soft.  Musculoskeletal:     Cervical back: Normal range of motion.  Lymphadenopathy:     Cervical: No cervical adenopathy.  Skin:    General: Skin is warm and dry.     Capillary Refill: Capillary refill takes less than 2 seconds.  Neurological:     Mental Status: She is alert and oriented to person, place, and time.  Psychiatric:        Behavior: Behavior normal.  Musculoskeletal Exam: Generalized hyperalgesia and positive tender points.  C-spine good ROM with discomfort.  Trapezius muscle tension and tenderness bilaterally.  Thoracic and lumbar spine good ROM.  Shoulder joints, elbow joints, wrist joints, MCPs, PIPs have good range of motion with no synovitis.  She has complete fist formation bilaterally.  Hip joints have good range of motion with discomfort bilaterally.  She has tenderness over the left trochanteric bursa.  Knee joints have good range of motion with no warmth or effusion.  Ankle joints have good range of motion no tenderness or inflammation.  CDAI Exam: CDAI Score: -- Patient Global: --; Provider Global: -- Swollen: --; Tender: -- Joint Exam 11/03/2019   No joint exam has been documented for this visit   There is currently no information documented on the homunculus. Go to the Rheumatology activity and complete the homunculus joint exam.  Investigation: No additional findings.  Imaging: No results found.  Recent Labs: Lab Results  Component Value Date   WBC 3.3 (L) 10/30/2017   HGB 11.0 (L) 10/30/2017   PLT 169 10/30/2017   NA 139 10/30/2017   K 4.0 10/30/2017   CL 103 10/30/2017   CO2 30  10/30/2017   GLUCOSE 111 (H) 10/30/2017   BUN 8 10/30/2017   CREATININE 0.73 10/30/2017   BILITOT 0.4 10/30/2017   ALKPHOS 72 10/30/2017   AST 23 10/30/2017   ALT 20 10/30/2017   PROT 6.4 (L) 10/30/2017   ALBUMIN 3.9 10/30/2017   CALCIUM 9.0 10/30/2017   GFRAA >60 10/30/2017    Speciality Comments: No specialty comments available.  Procedures:  Trigger Point Inj  Date/Time: 11/03/2019 1:53 PM Performed by: Ofilia Neas, PA-C Authorized by: Ofilia Neas, PA-C   Consent Given by:  Patient Site marked: the procedure site was marked   Timeout: prior to procedure the correct patient, procedure, and site was verified   Indications:  Pain Total # of Trigger Points:  2 Location: neck   Needle Size:  27 G Approach:  Dorsal Medications #1:  0.5 mL lidocaine 1 %; 10 mg triamcinolone acetonide 40 MG/ML Medications #2:  0.5 mL lidocaine 1 %; 10 mg triamcinolone acetonide 40 MG/ML Patient tolerance:  Patient tolerated the procedure well with no immediate complications   Allergies: Oxycodone, Reglan [metoclopramide], Tramadol hcl, Codeine, Cymbalta [duloxetine hcl], Eggs or egg-derived products, Gabapentin, Hydrocodone-acetaminophen, Penicillins, Savella [milnacipran hcl], Statins, Voltaren [diclofenac sodium], Zanaflex [tizanidine hcl], and Lyrica [pregabalin]   Assessment / Plan:     Visit Diagnoses: Fibromyalgia: She is currently having a fibromyalgia flare. She has been having more frequent and severe flares over the past 2 months. She presents today with trapezius muscle tension and muscle tenderness bilaterally. She is been experiencing muscle spasms and decreased range of motion of the C-spine. She requested trigger point injections today. She tolerated the procedure well. Aftercare was discussed. We discussed the use of her TENS unit more frequently as well as massage. She has been using a heating pad which has been helpful. She continues to take Robaxin 500 mg 1 tablet 3 times  daily as needed for muscle spasms. She was encouraged to continue to take coenzyme Q 10, turmeric, and ginger as recommended. She has been experiencing increased fatigue secondary to insomnia. We discussed the importance of regular exercise and good sleep hygiene. She was encouraged to continue to stretch on a daily basis. She will follow-up in the office in 3 months.   Other fatigue: Chronic and worsening secondary to insomnia.  Primary insomnia: She takes trazodone 50 mg 1 tablet by mouth at bedtime for insomnia. She has interrupted sleep at night due to nocturnal pain. We discussed the importance of good sleep hygiene.  Sicca syndrome [facility]): She continues to have chronic sicca symptoms. She uses Systane eyedrops for symptomatic relief.  Primary osteoarthritis of both hands: She is not having any discomfort in her hands at this time. No tenderness or inflammation was noted. She has complete fist formation bilaterally.  DDD (degenerative disc disease), cervical: C-spine has limited range of motion with lateral rotation. Trapezius muscle tension and muscle tenderness noted bilaterally. She is been experiencing muscle spasms more frequently. She continues to take Robaxin 500 mg 3 times daily as needed for muscle spasms and coenzyme Q 10 on a daily basis. She also uses a heating pad for symptomatic relief. We discussed the use of her TENS unit as well as neck stretching exercises. She requested trigger point injections today. She tolerated procedure well. The procedure note was completed above.  Ischial bursitis, right: She has ongoing discomfort and tenderness. She uses a cushion and perform stretching exercises several times per day. She declined physical therapy at this time.  Ischial bursitis of left side: She has ongoing tenderness and discomfort. She was encouraged to both continue to perform stretching exercises on a daily basis. She declined physical therapy at this time.  Trapezius muscle  spasm - She has trapezius muscle tension and tenderness bilaterally.  She has muscle spasms intermittently.  She takes Robaxin 500 mg 1 tablet 3 times daily as needed for muscle spasms.  She requested trigger point injections bilaterally. She tolerated the procedure well.    Pain of left femur - She had a fall on 08/22/2018 after falling off a ladder landing on her left side. Hematoma has resolved.  She continues to have intermittent discomfort on the anterolateral aspect of her left thigh. She was evaluated by Dr. Durward Fortes in the past.   Other medical conditions are listed as follows:   History of migraine  History of anemia  History of hypothyroidism  History of anxiety  Other neutropenia ([place])  History of kidney stones  History of cholecystectomy  Orders: Orders Placed This Encounter  Procedures   Trigger Point Inj   No orders of the defined types were placed in this encounter.   Follow-Up Instructions: Return in about 3 months (around 02/03/2020) for Fibromyalgia, Osteoarthritis, DDD.   Ofilia Neas, PA-C  Note - This record has been created using Dragon software.  Chart creation errors have been sought, but may not always  have been located. Such creation errors do not reflect on  the standard of medical care.

## 2019-11-03 ENCOUNTER — Other Ambulatory Visit: Payer: Self-pay

## 2019-11-03 ENCOUNTER — Encounter: Payer: Self-pay | Admitting: Physician Assistant

## 2019-11-03 ENCOUNTER — Ambulatory Visit (INDEPENDENT_AMBULATORY_CARE_PROVIDER_SITE_OTHER): Payer: Medicare Other | Admitting: Physician Assistant

## 2019-11-03 VITALS — BP 130/80 | HR 81 | Resp 14 | Ht 65.5 in | Wt 116.6 lb

## 2019-11-03 DIAGNOSIS — M7071 Other bursitis of hip, right hip: Secondary | ICD-10-CM

## 2019-11-03 DIAGNOSIS — Z862 Personal history of diseases of the blood and blood-forming organs and certain disorders involving the immune mechanism: Secondary | ICD-10-CM | POA: Diagnosis not present

## 2019-11-03 DIAGNOSIS — Z8669 Personal history of other diseases of the nervous system and sense organs: Secondary | ICD-10-CM

## 2019-11-03 DIAGNOSIS — Z8659 Personal history of other mental and behavioral disorders: Secondary | ICD-10-CM

## 2019-11-03 DIAGNOSIS — M3500 Sicca syndrome, unspecified: Secondary | ICD-10-CM | POA: Diagnosis not present

## 2019-11-03 DIAGNOSIS — M62838 Other muscle spasm: Secondary | ICD-10-CM | POA: Diagnosis not present

## 2019-11-03 DIAGNOSIS — Z9049 Acquired absence of other specified parts of digestive tract: Secondary | ICD-10-CM

## 2019-11-03 DIAGNOSIS — Z87442 Personal history of urinary calculi: Secondary | ICD-10-CM

## 2019-11-03 DIAGNOSIS — M797 Fibromyalgia: Secondary | ICD-10-CM | POA: Diagnosis not present

## 2019-11-03 DIAGNOSIS — M7072 Other bursitis of hip, left hip: Secondary | ICD-10-CM

## 2019-11-03 DIAGNOSIS — M5030 Other cervical disc degeneration, unspecified cervical region: Secondary | ICD-10-CM

## 2019-11-03 DIAGNOSIS — M19041 Primary osteoarthritis, right hand: Secondary | ICD-10-CM

## 2019-11-03 DIAGNOSIS — M19042 Primary osteoarthritis, left hand: Secondary | ICD-10-CM

## 2019-11-03 DIAGNOSIS — F5101 Primary insomnia: Secondary | ICD-10-CM

## 2019-11-03 DIAGNOSIS — M898X5 Other specified disorders of bone, thigh: Secondary | ICD-10-CM | POA: Diagnosis not present

## 2019-11-03 DIAGNOSIS — Z8639 Personal history of other endocrine, nutritional and metabolic disease: Secondary | ICD-10-CM

## 2019-11-03 DIAGNOSIS — D708 Other neutropenia: Secondary | ICD-10-CM

## 2019-11-03 DIAGNOSIS — R5383 Other fatigue: Secondary | ICD-10-CM

## 2019-11-03 MED ORDER — TRIAMCINOLONE ACETONIDE 40 MG/ML IJ SUSP
10.0000 mg | INTRAMUSCULAR | Status: AC | PRN
  Administered 2019-11-03: 10 mg via INTRAMUSCULAR

## 2019-11-03 MED ORDER — LIDOCAINE HCL 1 % IJ SOLN
0.5000 mL | INTRAMUSCULAR | Status: AC | PRN
  Administered 2019-11-03: .5 mL

## 2019-12-02 DIAGNOSIS — M810 Age-related osteoporosis without current pathological fracture: Secondary | ICD-10-CM

## 2019-12-02 DIAGNOSIS — M8580 Other specified disorders of bone density and structure, unspecified site: Secondary | ICD-10-CM

## 2019-12-02 HISTORY — DX: Other specified disorders of bone density and structure, unspecified site: M85.80

## 2019-12-02 HISTORY — DX: Age-related osteoporosis without current pathological fracture: M81.0

## 2019-12-08 DIAGNOSIS — D510 Vitamin B12 deficiency anemia due to intrinsic factor deficiency: Secondary | ICD-10-CM | POA: Diagnosis not present

## 2019-12-08 DIAGNOSIS — Z79899 Other long term (current) drug therapy: Secondary | ICD-10-CM | POA: Diagnosis not present

## 2019-12-08 DIAGNOSIS — D500 Iron deficiency anemia secondary to blood loss (chronic): Secondary | ICD-10-CM | POA: Diagnosis not present

## 2019-12-08 DIAGNOSIS — E559 Vitamin D deficiency, unspecified: Secondary | ICD-10-CM | POA: Diagnosis not present

## 2019-12-08 DIAGNOSIS — E039 Hypothyroidism, unspecified: Secondary | ICD-10-CM | POA: Diagnosis not present

## 2019-12-08 DIAGNOSIS — E782 Mixed hyperlipidemia: Secondary | ICD-10-CM | POA: Diagnosis not present

## 2019-12-14 DIAGNOSIS — N959 Unspecified menopausal and perimenopausal disorder: Secondary | ICD-10-CM | POA: Diagnosis not present

## 2019-12-14 DIAGNOSIS — Z0000 Encounter for general adult medical examination without abnormal findings: Secondary | ICD-10-CM | POA: Diagnosis not present

## 2019-12-18 NOTE — Progress Notes (Signed)
Office Visit Note  Patient: Danielle Byrd             Date of Birth: 07-28-1955           MRN: [id number]             PCP: Allie Dimmer, MD Referring: Allie Dimmer, MD Visit Date: 12/28/2019 Occupation: @GUAROCC @  Subjective:  Trapezius muscle spasms  History of Present Illness: Danielle Byrd is a 64 y.o. female with history of fibromyalgia and DDD.  She presents today with ongoing trapezius muscle tension and muscle tenderness bilaterally.  Her discomfort has been more severe on the left side recently.  She has been having difficulty lifting her left shoulder at times.  Her last trapezius muscle trigger point injection were performed on 11/03/19 and provided relief for about 1 month.  She has tried using Voltaren gel topically as needed as well as taking Tylenol as needed for pain relief.  She is also been using her TENS unit more frequently without much relief.  She continues to perform shoulder joint exercises on a daily basis. She had a recent DEXA on 12/14/19 was told she is in the osteoporosis range.  According to the patient her PCP plans on starting her on Fosamax but she has not started yet due to experiencing ongoing reflux as well as worsening constipation recently.  She is apprehensive to take Fosamax due to her underlying GI symptoms.   Activities of Daily Living:  Patient reports morning stiffness for 1.5  hours.   Patient Reports nocturnal pain.  Difficulty dressing/grooming: Reports Difficulty climbing stairs: Reports Difficulty getting out of chair: Reports Difficulty using hands for taps, buttons, cutlery, and/or writing: Denies  Review of Systems  Constitutional: Positive for fatigue.  HENT: Positive for mouth dryness. Negative for mouth sores and nose dryness.   Eyes: Positive for dryness. Negative for pain and visual disturbance.  Respiratory: Negative for cough, hemoptysis, shortness of breath and difficulty breathing.   Cardiovascular: Negative for  chest pain, palpitations, hypertension and swelling in legs/feet.  Gastrointestinal: Positive for constipation. Negative for blood in stool and diarrhea.  Endocrine: Negative for increased urination.  Genitourinary: Negative for difficulty urinating and painful urination.  Musculoskeletal: Positive for arthralgias, joint pain, joint swelling, myalgias, muscle weakness, morning stiffness, muscle tenderness and myalgias.  Skin: Negative for color change, pallor, rash, hair loss, nodules/bumps, skin tightness, ulcers and sensitivity to sunlight.  Allergic/Immunologic: Negative for susceptible to infections.  Neurological: Positive for headaches. Negative for dizziness, numbness and weakness.  Hematological: Negative for swollen glands.  Psychiatric/Behavioral: Positive for depressed mood and sleep disturbance. The patient is nervous/anxious.     PMFS History:  Patient Active Problem List   Diagnosis Date Noted  . Left thigh pain 05/05/2019  . 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 Hx of colonic polyps 09/05/2017  . Family hx of colon cancer 09/05/2017  . Migraine without aura and without status migrainosus, not intractable 03/04/2017  . Fibromyalgia 11/22/2016  . Other fatigue 11/22/2016  . DDD (degenerative disc disease), cervical 11/22/2016  . Primary osteoarthritis of both hands 11/22/2016  . History of migraine 11/22/2016  . History of anxiety 11/22/2016  . History of hypothyroidism 11/22/2016  . History of kidney stones 11/22/2016  . History of cholecystectomy 11/22/2016  . History of anemia 11/22/2016  . Hx of bone density study/ normal 2014 this is followed by her Primary Care  11/22/2016  . Sicca syndrome ([place]) 11/22/2016  . 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  Headache(784.0) 10/27/2013  . Leukopenia 07/08/2013  . Anemia 10/31/2011  . Iron deficiency 10/31/2011  . GERD (gastroesophageal reflux disease) 05/14/2011  . Abdominal pain, bilateral upper quadrant 05/14/2011    . H/O fibromyalgia 05/14/2011  . Constipation 05/14/2011  . Hyperlipemia 05/14/2011  . Kidney stones 01/11/2011    Past Medical History:  Diagnosis Date  . Anemia   . Anxiety   . Arthritis   . Cervical stenosis (uterine cervix)   . Closed fracture of left foot   . Constipation   . Copper deficiency   . Depression   . Fibromyalgia   . GERD (gastroesophageal reflux disease)   . High cholesterol   . History of kidney stones   . Hyperlipidemia   . Hypothyroidism   . Kidney stones   . Lumbar stenosis 11/27/2016  . Melanosis coli   . Migraines   . Nephrolithiasis   . Osteopenia 12/2019   Per patient, diagnosed by PCP Allie Dimmer, MD  . Osteoporosis 12/2019   Per patient, diagnosed by PCP Allie Dimmer, MD  . Panic attack   . Rosacea   . Sliding hiatal hernia   . Spondylosis, cervical     Family History  Problem Relation Age of Onset  . Diabetes Mother   . Hypertension Mother   . Anemia Mother   . Colon cancer Father   . Cancer Father 38       colon cancer  . Heart attack Father    Past Surgical History:  Procedure Laterality Date  . ABDOMINAL HYSTERECTOMY    . BONE MARROW BIOPSY     & aspirate  . BREAST SURGERY Right    partial mastectomy  . CHOLECYSTECTOMY    . COLONOSCOPY N/A 07/22/2012   Procedure: COLONOSCOPY;  Surgeon: [name], MD;  Location: [facility];  Service: Endoscopy;  Laterality: N/A;  730  . COLONOSCOPY WITH PROPOFOL N/A 10/25/2017   Procedure: COLONOSCOPY WITH PROPOFOL;  Surgeon: [name], MD;  Location: [facility];  Service: Endoscopy;  Laterality: N/A;  10:30  . DILATION AND CURETTAGE OF UTERUS    . EYE SURGERY     sty removal and reconstruction of eye lids.  Marland Kitchen NISSEN FUNDOPLICATION    . POLYPECTOMY  10/25/2017   Procedure: POLYPECTOMY;  Surgeon: [name], MD;  Location: [facility];  Service: Endoscopy;;  sigmoid    Social History   Social History Narrative   Patient lives at home with chacha (boxer,  therapy dog)   Patient right handed   Patient has a BS degree.   Patient drinks 2 cups of tea daily.    There is no immunization history on file for this patient.   Objective: Vital Signs: BP (!) 145/85 (BP Location: Left Arm, Patient Position: Sitting, Cuff Size: Small)   Pulse 81   Ht 5' 5.5" (1.664 m)   Wt 116 lb 12.8 oz (53 kg)   BMI 19.14 kg/m    Physical Exam Vitals and nursing note reviewed.  Constitutional:      Appearance: She is well-developed.  HENT:     Head: Normocephalic and atraumatic.  Eyes:     Conjunctiva/sclera: Conjunctivae normal.  Pulmonary:     Effort: Pulmonary effort is normal.  Abdominal:     Palpations: Abdomen is soft.  Musculoskeletal:     Cervical back: Normal range of motion.  Skin:    General: Skin is warm and dry.     Capillary Refill: Capillary refill takes  less than 2 seconds.  Neurological:     Mental Status: She is alert and oriented to person, place, and time.  Psychiatric:        Behavior: Behavior normal.      Musculoskeletal Exam: Generalized hyperalgesia and positive tender points. C-spine limited ROM with lateral rotation especially to the left.  Trapezius muscle tension and tenderness bilaterally.  Thoracic kyphosis noted.  Right shoulder good ROM.  Left shoulder limited abduction to 120 degrees.  Elbow joints, wrist joints, MCPs, PIPs, and DIPs good ROM with no synovitis.  Complete fist formation bilaterally.  Ischial bursitis bilaterally.  knee joints good ROM with no warmth or effusion.  Ankle joints good ROM with no tenderness or inflammation.   CDAI Exam: CDAI Score: -- Patient Global: --; Provider Global: -- Swollen: --; Tender: -- Joint Exam 12/28/2019   No joint exam has been documented for this visit   There is currently no information documented on the homunculus. Go to the Rheumatology activity and complete the homunculus joint exam.  Investigation: No additional findings.  Imaging: No results  found.  Recent Labs: Lab Results  Component Value Date   WBC 3.3 (L) 10/30/2017   HGB 11.0 (L) 10/30/2017   PLT 169 10/30/2017   NA 139 10/30/2017   K 4.0 10/30/2017   CL 103 10/30/2017   CO2 30 10/30/2017   GLUCOSE 111 (H) 10/30/2017   BUN 8 10/30/2017   CREATININE 0.73 10/30/2017   BILITOT 0.4 10/30/2017   ALKPHOS 72 10/30/2017   AST 23 10/30/2017   ALT 20 10/30/2017   PROT 6.4 (L) 10/30/2017   ALBUMIN 3.9 10/30/2017   CALCIUM 9.0 10/30/2017   GFRAA >60 10/30/2017    Speciality Comments: No specialty comments available.  Procedures:  Trigger Point Inj  Date/Time: 12/28/2019 2:03 PM Performed by: Ofilia Neas, PA-C Authorized by: Ofilia Neas, PA-C   Consent Given by:  Patient Site marked: the procedure site was marked   Timeout: prior to procedure the correct patient, procedure, and site was verified   Indications:  Pain Total # of Trigger Points:  2 Location: neck   Needle Size:  27 G Approach:  Dorsal Medications #1:  0.5 mL lidocaine 1 %; 10 mg triamcinolone acetonide 40 MG/ML Medications #2:  0.5 mL lidocaine 1 %; 10 mg triamcinolone acetonide 40 MG/ML Patient tolerance:  Patient tolerated the procedure well with no immediate complications   Allergies: Oxycodone, Reglan [metoclopramide], Tramadol hcl, Codeine, Cymbalta [duloxetine hcl], Eggs or egg-derived products, Gabapentin, Hydrocodone-acetaminophen, Penicillins, Savella [milnacipran hcl], Statins, Voltaren [diclofenac sodium], Zanaflex [tizanidine hcl], and Lyrica [pregabalin]   Assessment / Plan:     Visit Diagnoses: Fibromyalgia: She has generalized hyperalgesia and positive tender points on exam.  She presents today with trapezius muscle tension and muscle tenderness bilaterally.  She had trigger point injections on 11/03/2019 which provided significant relief for about 4 to 5 weeks but her discomfort has returned.  She has been using her TENS unit more frequently and is tried using Voltaren gel and  taking Tylenol as needed for pain relief.  She requested repeat trigger point injections today.  Procedure note was completed above.  She continues to have chronic fatigue secondary to insomnia.  She has interrupted sleep due to nocturnal pain.  She continues to take trazodone 50 mg 1 tablet by mouth at bedtime for insomnia.  We discussed the importance of regular exercise and good sleep hygiene.  She will be referred to physical therapy for further  evaluation and management.  She will follow-up in the office in 2 months.   Other fatigue: She has chronic fatigue secondary to insomnia.  She was encouraged to increase her activity level.   Primary insomnia: She takes trazodone 50 mg 1 tablet by mouth at bedtime for insomnia.  She continues to have interrupted sleep at night due to nocturnal pain.   Sicca syndrome [facility]): She continues to have chronic sicca symptoms.   Primary osteoarthritis of both hands: She is not experiencing any discomfort or joint swelling in her hands at this time.  No synovitis was noted.  She is able to make a complete fist.  DDD (degenerative disc disease), cervical - She has limited range of motion with lateral rotation.  Good flexion-extension of the C-spine.  She has been experiencing increased stiffness and discomfort in her neck.  She has trapezius muscle tension and muscle tenderness bilaterally.  Trigger point ductions were performed today.  She was encouraged to continue to perform neck exercises.  We will also refer her to physical therapy.  Plan: Ambulatory referral to Physical Therapy  Ischial bursitis, right: Chronic pain.  She uses a cushion if she plans on sitting for prolonged periods of time.  She tries to change positions frequently.  She continues to perform stretching exercises on a daily basis.  Ischial bursitis of left side: She has ongoing discomfort and difficulty sitting for a prolonged period of time.  She uses a cushion when sitting for prolonged periods  of time.  She perform stretching exercises on a daily basis.  Trapezius muscle spasm - She presents today with trapezius muscle tension and muscle tenderness bilaterally.  She has been experiencing frequent muscle spasms recently.  Her pain is started to radiate between her shoulder blades as well as down her left arm.  She has tried using Voltaren gel topically and taking Tylenol as needed for pain relief without much relief.  She is also been using her TENS unit more frequently.  She had bilateral trigger point injections performed on plan 11/03/2019 which provided significant relief for about 4 to 5 weeks.  She requested repeat trigger point injections today.  She tolerated the procedure well.  Aftercare was discussed.  We will also refer her to physical therapy for further evaluation and management since this has been such an ongoing issue.: Trigger Point Inj, Ambulatory referral to Physical Therapy  Chronic left shoulder pain - She presents today with increased discomfort her left shoulder/upper arm.  She has limited abduction to about 120 degrees and painful internal rotation on exam.  No tenderness over the [facility] joint or posteriorly.  She has ongoing trapezius muscle tension and tenderness, worse on the left side.  She has been applying voltaren gel and taking tylenol as needed for pain relief.  She has also been performing shoulder and neck exercises.  Her TENS unit has been providing temporary relief but she has had to use it more frequently.  X-rays of the left shoulder joint were obtained today.  She will be referred to physical therapy for further evaluation and management.  Plan: XR Shoulder Left  Age-related osteoporosis without current pathological fracture: DEXA updated on 12/14/2019 left femoral neck BMD 0.500 with T score -3.1.  According to the patient her PCP plans on starting her on Fosamax for management of osteoporosis.  Due to the patient's underlying GI symptoms she is apprehensive to start  on an oral bisphosphonate.  We briefly discussed other treatment options which she can  further discuss with her PCP.  She was given an informational handout about Reclast as well as Prolia to review.  She was encouraged to continue to take a calcium and vitamin D supplement.  Her vitamin D level was 86 on 12/08/2019.  Other medical conditions are listed as follows:   History of migraine  History of anemia  History of hypothyroidism  History of anxiety    Orders: Orders Placed This Encounter  Procedures  . Trigger Point Inj  . XR Shoulder Left  . Ambulatory referral to Physical Therapy   No orders of the defined types were placed in this encounter.     Follow-Up Instructions: Return in about 3 months (around 03/28/2020) for Fibromyalgia, Osteoporosis.   Ofilia Neas, PA-C  Note - This record has been created using Dragon software.  Chart creation errors have been sought, but may not always  have been located. Such creation errors do not reflect on  the standard of medical care.

## 2019-12-28 ENCOUNTER — Ambulatory Visit (INDEPENDENT_AMBULATORY_CARE_PROVIDER_SITE_OTHER): Payer: Medicare Other

## 2019-12-28 ENCOUNTER — Encounter: Payer: Self-pay | Admitting: Physician Assistant

## 2019-12-28 ENCOUNTER — Ambulatory Visit (INDEPENDENT_AMBULATORY_CARE_PROVIDER_SITE_OTHER): Payer: Medicare Other | Admitting: Physician Assistant

## 2019-12-28 ENCOUNTER — Other Ambulatory Visit: Payer: Self-pay

## 2019-12-28 VITALS — BP 145/85 | HR 81 | Ht 65.5 in | Wt 116.8 lb

## 2019-12-28 DIAGNOSIS — M25512 Pain in left shoulder: Secondary | ICD-10-CM

## 2019-12-28 DIAGNOSIS — M5030 Other cervical disc degeneration, unspecified cervical region: Secondary | ICD-10-CM | POA: Diagnosis not present

## 2019-12-28 DIAGNOSIS — M7072 Other bursitis of hip, left hip: Secondary | ICD-10-CM | POA: Diagnosis not present

## 2019-12-28 DIAGNOSIS — Z8669 Personal history of other diseases of the nervous system and sense organs: Secondary | ICD-10-CM

## 2019-12-28 DIAGNOSIS — R5383 Other fatigue: Secondary | ICD-10-CM | POA: Diagnosis not present

## 2019-12-28 DIAGNOSIS — M19042 Primary osteoarthritis, left hand: Secondary | ICD-10-CM

## 2019-12-28 DIAGNOSIS — Z862 Personal history of diseases of the blood and blood-forming organs and certain disorders involving the immune mechanism: Secondary | ICD-10-CM | POA: Diagnosis not present

## 2019-12-28 DIAGNOSIS — M797 Fibromyalgia: Secondary | ICD-10-CM | POA: Diagnosis not present

## 2019-12-28 DIAGNOSIS — M62838 Other muscle spasm: Secondary | ICD-10-CM

## 2019-12-28 DIAGNOSIS — Z8639 Personal history of other endocrine, nutritional and metabolic disease: Secondary | ICD-10-CM | POA: Diagnosis not present

## 2019-12-28 DIAGNOSIS — M7071 Other bursitis of hip, right hip: Secondary | ICD-10-CM

## 2019-12-28 DIAGNOSIS — G8929 Other chronic pain: Secondary | ICD-10-CM | POA: Diagnosis not present

## 2019-12-28 DIAGNOSIS — M810 Age-related osteoporosis without current pathological fracture: Secondary | ICD-10-CM

## 2019-12-28 DIAGNOSIS — M3500 Sicca syndrome, unspecified: Secondary | ICD-10-CM

## 2019-12-28 DIAGNOSIS — F5101 Primary insomnia: Secondary | ICD-10-CM | POA: Diagnosis not present

## 2019-12-28 DIAGNOSIS — M19041 Primary osteoarthritis, right hand: Secondary | ICD-10-CM

## 2019-12-28 DIAGNOSIS — Z8659 Personal history of other mental and behavioral disorders: Secondary | ICD-10-CM

## 2019-12-28 MED ORDER — LIDOCAINE HCL 1 % IJ SOLN
0.5000 mL | INTRAMUSCULAR | Status: AC | PRN
  Administered 2019-12-28: .5 mL

## 2019-12-28 MED ORDER — TRIAMCINOLONE ACETONIDE 40 MG/ML IJ SUSP
10.0000 mg | INTRAMUSCULAR | Status: AC | PRN
  Administered 2019-12-28: 10 mg via INTRAMUSCULAR

## 2019-12-29 NOTE — Progress Notes (Signed)
I called the patient to review x-ray findings of the left shoulder.  We discussed that the high riding humerus could be due to positioning and the severity of pain she was experiencing yesterday or could be an indicator of possible rotator cuff tear.  A referral to physical therapy was placed yesterday which we will update to also include evaluation and treatment of left shoulder pain.  She was advised to notify us if she does not notice improvement with physical therapy and then we will proceed with scheduling an MRI for further evaluation. She was encouraged to continue to use voltaren gel and her TENs unit as needed for pain relief.  She has started to notice some improvement since having the trigger point injections yesterday.

## 2020-01-06 DIAGNOSIS — M542 Cervicalgia: Secondary | ICD-10-CM | POA: Diagnosis not present

## 2020-01-06 DIAGNOSIS — G8929 Other chronic pain: Secondary | ICD-10-CM | POA: Diagnosis not present

## 2020-01-06 DIAGNOSIS — M5030 Other cervical disc degeneration, unspecified cervical region: Secondary | ICD-10-CM | POA: Diagnosis not present

## 2020-01-06 DIAGNOSIS — M25512 Pain in left shoulder: Secondary | ICD-10-CM | POA: Diagnosis not present

## 2020-01-06 DIAGNOSIS — M2560 Stiffness of unspecified joint, not elsewhere classified: Secondary | ICD-10-CM | POA: Diagnosis not present

## 2020-01-06 DIAGNOSIS — M6281 Muscle weakness (generalized): Secondary | ICD-10-CM | POA: Diagnosis not present

## 2020-01-06 DIAGNOSIS — M62838 Other muscle spasm: Secondary | ICD-10-CM | POA: Diagnosis not present

## 2020-01-07 DIAGNOSIS — M25512 Pain in left shoulder: Secondary | ICD-10-CM | POA: Diagnosis not present

## 2020-01-07 DIAGNOSIS — M2560 Stiffness of unspecified joint, not elsewhere classified: Secondary | ICD-10-CM | POA: Diagnosis not present

## 2020-01-07 DIAGNOSIS — G8929 Other chronic pain: Secondary | ICD-10-CM | POA: Diagnosis not present

## 2020-01-07 DIAGNOSIS — M6281 Muscle weakness (generalized): Secondary | ICD-10-CM | POA: Diagnosis not present

## 2020-01-07 DIAGNOSIS — M542 Cervicalgia: Secondary | ICD-10-CM | POA: Diagnosis not present

## 2020-01-07 DIAGNOSIS — M5030 Other cervical disc degeneration, unspecified cervical region: Secondary | ICD-10-CM | POA: Diagnosis not present

## 2020-01-07 DIAGNOSIS — M62838 Other muscle spasm: Secondary | ICD-10-CM | POA: Diagnosis not present

## 2020-01-13 DIAGNOSIS — M6281 Muscle weakness (generalized): Secondary | ICD-10-CM | POA: Diagnosis not present

## 2020-01-13 DIAGNOSIS — M62838 Other muscle spasm: Secondary | ICD-10-CM | POA: Diagnosis not present

## 2020-01-13 DIAGNOSIS — G8929 Other chronic pain: Secondary | ICD-10-CM | POA: Diagnosis not present

## 2020-01-13 DIAGNOSIS — M5030 Other cervical disc degeneration, unspecified cervical region: Secondary | ICD-10-CM | POA: Diagnosis not present

## 2020-01-13 DIAGNOSIS — M2560 Stiffness of unspecified joint, not elsewhere classified: Secondary | ICD-10-CM | POA: Diagnosis not present

## 2020-01-13 DIAGNOSIS — M542 Cervicalgia: Secondary | ICD-10-CM | POA: Diagnosis not present

## 2020-01-13 DIAGNOSIS — M25512 Pain in left shoulder: Secondary | ICD-10-CM | POA: Diagnosis not present

## 2020-01-14 DIAGNOSIS — M5030 Other cervical disc degeneration, unspecified cervical region: Secondary | ICD-10-CM | POA: Diagnosis not present

## 2020-01-14 DIAGNOSIS — G8929 Other chronic pain: Secondary | ICD-10-CM | POA: Diagnosis not present

## 2020-01-14 DIAGNOSIS — M2560 Stiffness of unspecified joint, not elsewhere classified: Secondary | ICD-10-CM | POA: Diagnosis not present

## 2020-01-14 DIAGNOSIS — M542 Cervicalgia: Secondary | ICD-10-CM | POA: Diagnosis not present

## 2020-01-14 DIAGNOSIS — M25512 Pain in left shoulder: Secondary | ICD-10-CM | POA: Diagnosis not present

## 2020-01-14 DIAGNOSIS — M62838 Other muscle spasm: Secondary | ICD-10-CM | POA: Diagnosis not present

## 2020-01-14 DIAGNOSIS — M6281 Muscle weakness (generalized): Secondary | ICD-10-CM | POA: Diagnosis not present

## 2020-01-18 DIAGNOSIS — G8929 Other chronic pain: Secondary | ICD-10-CM | POA: Diagnosis not present

## 2020-01-18 DIAGNOSIS — M2560 Stiffness of unspecified joint, not elsewhere classified: Secondary | ICD-10-CM | POA: Diagnosis not present

## 2020-01-18 DIAGNOSIS — M25512 Pain in left shoulder: Secondary | ICD-10-CM | POA: Diagnosis not present

## 2020-01-18 DIAGNOSIS — M6281 Muscle weakness (generalized): Secondary | ICD-10-CM | POA: Diagnosis not present

## 2020-01-18 DIAGNOSIS — M5030 Other cervical disc degeneration, unspecified cervical region: Secondary | ICD-10-CM | POA: Diagnosis not present

## 2020-01-18 DIAGNOSIS — M542 Cervicalgia: Secondary | ICD-10-CM | POA: Diagnosis not present

## 2020-01-18 DIAGNOSIS — M62838 Other muscle spasm: Secondary | ICD-10-CM | POA: Diagnosis not present

## 2020-01-20 NOTE — Progress Notes (Signed)
Office Visit Note  Patient: Danielle Byrd             Date of Birth: [birth year]           MRN: [id number]             PCP: Allie Dimmer, MD Referring: Allie Dimmer, MD Visit Date: 02/03/2020 Occupation: '@GUAROCC' @  Subjective:  Pain in both legs   History of Present Illness: Danielle Byrd is a 64 y.o. female with history of fibromyalgia, DDD, and osteoarthritis.  She has been experiencing increased discomfort in her lower back and bilateral lower extremities.  She reports her mother has recently moved in with her and has had to be more physically active recently.  She is currently having pain in the right knee joint and has been using a walker to assist with ambulation.  She states she has radiating pain down her left leg coming from her back which is severe at times.  She has to ice her legs to alleviate the discomfort.  She has been going to physical therapy twice a week and going to the pool twice a week which has been improving her generalized pain from fibromyalgia.  She has ongoing tenderness and tension in both trapezius muscles.      Activities of Daily Living:  Patient reports morning stiffness for 6-7 hours.   Patient Reports nocturnal pain.  Difficulty dressing/grooming: Reports Difficulty climbing stairs: Reports Difficulty getting out of chair: Reports Difficulty using hands for taps, buttons, cutlery, and/or writing: Reports  Review of Systems  Constitutional: Positive for fatigue.  HENT: Positive for mouth dryness and nose dryness. Negative for mouth sores.   Eyes: Positive for visual disturbance and dryness. Negative for pain.  Respiratory: Negative for cough, hemoptysis, shortness of breath and difficulty breathing.   Cardiovascular: Positive for hypertension. Negative for chest pain, palpitations and swelling in legs/feet.  Gastrointestinal: Positive for constipation. Negative for blood in stool and diarrhea.  Endocrine: Negative for increased  urination.  Genitourinary: Negative for painful urination.  Musculoskeletal: Positive for arthralgias, joint pain, joint swelling, myalgias, muscle weakness, morning stiffness, muscle tenderness and myalgias.  Skin: Positive for hair loss. Negative for color change, pallor, rash, nodules/bumps, skin tightness, ulcers and sensitivity to sunlight.  Allergic/Immunologic: Negative for susceptible to infections.  Neurological: Positive for dizziness and weakness. Negative for numbness and headaches.  Hematological: Negative for swollen glands.  Psychiatric/Behavioral: Positive for depressed mood and sleep disturbance. The patient is nervous/anxious.     PMFS History:  Patient Active Problem List   Diagnosis Date Noted  . Left thigh pain 05/05/2019  . 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 Hx of colonic polyps 09/05/2017  . Family hx of colon cancer 09/05/2017  . Migraine without aura and without status migrainosus, not intractable 03/04/2017  . Fibromyalgia 11/22/2016  . Other fatigue 11/22/2016  . DDD (degenerative disc disease), cervical 11/22/2016  . Primary osteoarthritis of both hands 11/22/2016  . History of migraine 11/22/2016  . History of anxiety 11/22/2016  . History of hypothyroidism 11/22/2016  . History of kidney stones 11/22/2016  . History of cholecystectomy 11/22/2016  . History of anemia 11/22/2016  . Hx of bone density study/ normal 2014 this is followed by her Primary Care  11/22/2016  . Sicca syndrome ([place]) 11/22/2016  . 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 Headache(784.0) 10/27/2013  . Leukopenia 07/08/2013  . Anemia 10/31/2011  . Iron deficiency 10/31/2011  . GERD (gastroesophageal reflux disease) 05/14/2011  . Abdominal pain,  bilateral upper quadrant 05/14/2011  . H/O fibromyalgia 05/14/2011  . Constipation 05/14/2011  . Hyperlipemia 05/14/2011  . Kidney stones 01/11/2011    Past Medical History:  Diagnosis Date  . Anemia   . Anxiety   . Arthritis    . Cervical stenosis (uterine cervix)   . Closed fracture of left foot   . Constipation   . Copper deficiency   . Depression   . Fibromyalgia   . GERD (gastroesophageal reflux disease)   . High cholesterol   . History of kidney stones   . Hyperlipidemia   . Hypothyroidism   . Kidney stones   . Lumbar stenosis 11/27/2016  . Melanosis coli   . Migraines   . Nephrolithiasis   . Osteopenia 12/2019   Per patient, diagnosed by PCP Allie Dimmer, MD  . Osteoporosis 12/2019   Per patient, diagnosed by PCP Allie Dimmer, MD  . Panic attack   . Rosacea   . Sliding hiatal hernia   . Spondylosis, cervical     Family History  Problem Relation Age of Onset  . Diabetes Mother   . Hypertension Mother   . Anemia Mother   . Colon cancer Father   . Cancer Father 24       colon cancer  . Heart attack Father    Past Surgical History:  Procedure Laterality Date  . ABDOMINAL HYSTERECTOMY    . BONE MARROW BIOPSY     & aspirate  . BREAST SURGERY Right    partial mastectomy  . CHOLECYSTECTOMY    . COLONOSCOPY N/A 07/22/2012   Procedure: COLONOSCOPY;  Surgeon: [name], MD;  Location: [facility];  Service: Endoscopy;  Laterality: N/A;  730  . COLONOSCOPY WITH PROPOFOL N/A 10/25/2017   Procedure: COLONOSCOPY WITH PROPOFOL;  Surgeon: [name], MD;  Location: [facility];  Service: Endoscopy;  Laterality: N/A;  10:30  . DILATION AND CURETTAGE OF UTERUS    . EYE SURGERY     sty removal and reconstruction of eye lids.  Marland Kitchen NISSEN FUNDOPLICATION    . POLYPECTOMY  10/25/2017   Procedure: POLYPECTOMY;  Surgeon: [name], MD;  Location: [facility];  Service: Endoscopy;;  sigmoid    Social History   Social History Narrative   Patient lives at home with chacha (boxer, therapy dog)   Patient right handed   Patient has a BS degree.   Patient drinks 2 cups of tea daily.    There is no immunization history on file for this patient.   Objective: Vital Signs: BP  123/73 (BP Location: Left Arm, Patient Position: Sitting, Cuff Size: Small)   Pulse 93   Ht 5' 5.5" (1.664 m)   Wt 116 lb (52.6 kg)   BMI 19.01 kg/m    Physical Exam Vitals and nursing note reviewed.  Constitutional:      Appearance: She is well-developed.  HENT:     Head: Normocephalic and atraumatic.  Eyes:     Conjunctiva/sclera: Conjunctivae normal.  Pulmonary:     Effort: Pulmonary effort is normal.  Abdominal:     Palpations: Abdomen is soft.  Musculoskeletal:     Cervical back: Normal range of motion.  Skin:    General: Skin is warm and dry.     Capillary Refill: Capillary refill takes less than 2 seconds.  Neurological:     Mental Status: She is alert and oriented to person, place, and time.  Psychiatric:  Behavior: Behavior normal.      Musculoskeletal Exam:  Generalized hyperalgesia and positive tender points on exam.  C-spine limited ROM with lateral rotation. Painful ROM of lumbar spine.  Left SI joint tenderness noted. Trapezius muscle tension and tenderness bilaterally.  Shoulder joints, elbow joints, wrist joints, MCPs, PIPs, and DIPs good ROM with no synovitis.  Complete fist formation bilaterally. Hip joints, knee joints, and ankle joints good ROM with no discomfort. Mild swelling of the right knee joint noted. No warmth or effusion of knee joints.  No tenderness or swelling of ankle joints. Tenderness over bilateral trochanteric bursa.   CDAI Exam: CDAI Score: -- Patient Global: --; Provider Global: -- Swollen: --; Tender: -- Joint Exam 02/03/2020   No joint exam has been documented for this visit   There is currently no information documented on the homunculus. Go to the Rheumatology activity and complete the homunculus joint exam.  Investigation: No additional findings.  Imaging: XR KNEE 3 VIEW RIGHT  Result Date: 02/03/2020 Moderate lateral compartment narrowing was noted.  Mild patellofemoral narrowing was noted.  No chondrocalcinosis was  noted. Impression: These findings are consistent with moderate osteoarthritis and mild chondromalacia patella.   Recent Labs: Lab Results  Component Value Date   WBC 3.3 (L) 10/30/2017   HGB 11.0 (L) 10/30/2017   PLT 169 10/30/2017   NA 139 10/30/2017   K 4.0 10/30/2017   CL 103 10/30/2017   CO2 30 10/30/2017   GLUCOSE 111 (H) 10/30/2017   BUN 8 10/30/2017   CREATININE 0.73 10/30/2017   BILITOT 0.4 10/30/2017   ALKPHOS 72 10/30/2017   AST 23 10/30/2017   ALT 20 10/30/2017   PROT 6.4 (L) 10/30/2017   ALBUMIN 3.9 10/30/2017   CALCIUM 9.0 10/30/2017   GFRAA >60 10/30/2017    Speciality Comments: No specialty comments available.  Procedures:  Trigger Point Inj  Date/Time: 02/03/2020 1:45 PM Performed by: Ofilia Neas, PA-C Authorized by: Ofilia Neas, PA-C   Consent Given by:  Patient Site marked: the procedure site was marked   Timeout: prior to procedure the correct patient, procedure, and site was verified   Indications:  Pain Total # of Trigger Points:  2 Location: neck   Needle Size:  27 G Approach:  Dorsal Medications #1:  0.5 mL lidocaine 1 %; 10 mg triamcinolone acetonide 40 MG/ML Medications #2:  0.5 mL lidocaine 1 %; 10 mg triamcinolone acetonide 40 MG/ML Patient tolerance:  Patient tolerated the procedure well with no immediate complications   Allergies: Oxycodone, Reglan [metoclopramide], Tramadol hcl, Codeine, Cymbalta [duloxetine hcl], Eggs or egg-derived products, Gabapentin, Hydrocodone-acetaminophen, Penicillins, Savella [milnacipran hcl], Statins, Voltaren [diclofenac sodium], Zanaflex [tizanidine hcl], and Lyrica [pregabalin]   Assessment / Plan:     Visit Diagnoses: Fibromyalgia: She has generalized hyperalgesia and positive tender points on exam.  She has ongoing trapezius muscle tension and muscle tenderness bilaterally.  She has been going to physical therapy twice a week which has been improving her symptoms significantly.  She requested  trigger point injections today.  She tolerated the procedure well.  She has been going to the pool twice a week to exercise which she feels has helped with some of her generalized myalgias.  Her mother recently moved in with her which has increased her physical burden as she has had to assist her mother on a daily basis.  She has been experiencing increased lower back pain with left-sided radiculopathy.  She has been using a cane to assist with  ambulation.  She plans on following up with Dr. Zannie Kehr to discuss a repeat epidural injection.  We discussed the importance of regular exercise and good sleep hygiene.  She will follow-up in the office in 3 months.  Other fatigue: Chronic and secondary to insomnia.  We discussed the importance of regular exercise.  She has been going to physical therapy twice a week and going to the pool to exercise twice a week.  Primary insomnia: She takes trazodone 50 mg 1 tablet by mouth at bedtime for insomnia.  Trapezius muscle spasm: She has ongoing trapezius muscle tension and muscle tenderness bilaterally.  She has been going to physical therapy twice a week which has improved her range of motion and overall discomfort.  She requested trigger point injections today which have been helpful in the past.  She tolerated the procedure well.  The procedure note was completed above.  Ischial bursitis, right: She has ongoing discomfort and tenderness.  She uses a cushion while sitting.  Ischial bursitis of left side: Chronic.  She uses a cushion for support.   Sicca syndrome ([place]): She has ongoing sicca symptoms.  Over-the-counter products were discussed. She is using systane eye drops.   Primary osteoarthritis of both hands: She is not having discomfort or stiffness in her hands at this time.  She has no tenderness or inflammation on exam.  She is able to make a complete fist bilaterally.  She was encouraged to continue to take natural anti-inflammatories as previously  recommended.  DDD (degenerative disc disease), cervical: She has slightly limited ROM with lateral rotation.    Age-related osteoporosis without current pathological fracture:  DEXA ordered by PCP and updated on 12/14/19. She is taking a calcium and vitamin D supplement. She is not a good candidate for fosamax due to the severity of reflux. She may be a good candidate for IV reclast.   Chronic pain of right knee - She has been experiencing increased pain in the right knee joint over the past 2 to 3 months.  She has been a lot more physically active since her mother moved in with her recently.  She has been having to walk with a walker to assist with ambulation.  She has mild swelling in the right knee joint on examination today.  X-rays of the right knee joint were obtained which were consistent with moderate osteoarthritis and mild chondromalacia patella.  We discussed the use of Voltaren gel, ice, compression, and strengthening exercises.  She was given a handout of exercises to perform.  She will notify us when she would like a referral to physical therapy.  She was advised to notify us if her discomfort persists or worsens..  Plan: XR KNEE 3 VIEW RIGHT  Other medical conditions are listed as follows:   History of anxiety  History of kidney stones  History of cholecystectomy  History of migraine  History of anemia  History of hypothyroidism     Orders: Orders Placed This Encounter  Procedures  . Trigger Point Inj  . XR KNEE 3 VIEW RIGHT   No orders of the defined types were placed in this encounter.   Follow-Up Instructions: Return in about 3 months (around 05/05/2020) for Fibromyalgia, Osteoarthritis, DDD.   Ofilia Neas, PA-C  Note - This record has been created using Dragon software.  Chart creation errors have been sought, but may not always  have been located. Such creation errors do not reflect on  the standard of medical care.

## 2020-01-21 DIAGNOSIS — G8929 Other chronic pain: Secondary | ICD-10-CM | POA: Diagnosis not present

## 2020-01-21 DIAGNOSIS — M25512 Pain in left shoulder: Secondary | ICD-10-CM | POA: Diagnosis not present

## 2020-01-21 DIAGNOSIS — M5030 Other cervical disc degeneration, unspecified cervical region: Secondary | ICD-10-CM | POA: Diagnosis not present

## 2020-01-21 DIAGNOSIS — M62838 Other muscle spasm: Secondary | ICD-10-CM | POA: Diagnosis not present

## 2020-01-21 DIAGNOSIS — M6281 Muscle weakness (generalized): Secondary | ICD-10-CM | POA: Diagnosis not present

## 2020-01-21 DIAGNOSIS — M542 Cervicalgia: Secondary | ICD-10-CM | POA: Diagnosis not present

## 2020-01-21 DIAGNOSIS — M2560 Stiffness of unspecified joint, not elsewhere classified: Secondary | ICD-10-CM | POA: Diagnosis not present

## 2020-01-27 DIAGNOSIS — M6281 Muscle weakness (generalized): Secondary | ICD-10-CM | POA: Diagnosis not present

## 2020-01-27 DIAGNOSIS — M5030 Other cervical disc degeneration, unspecified cervical region: Secondary | ICD-10-CM | POA: Diagnosis not present

## 2020-01-27 DIAGNOSIS — G8929 Other chronic pain: Secondary | ICD-10-CM | POA: Diagnosis not present

## 2020-01-27 DIAGNOSIS — M25512 Pain in left shoulder: Secondary | ICD-10-CM | POA: Diagnosis not present

## 2020-01-27 DIAGNOSIS — M62838 Other muscle spasm: Secondary | ICD-10-CM | POA: Diagnosis not present

## 2020-01-27 DIAGNOSIS — M542 Cervicalgia: Secondary | ICD-10-CM | POA: Diagnosis not present

## 2020-01-27 DIAGNOSIS — M2560 Stiffness of unspecified joint, not elsewhere classified: Secondary | ICD-10-CM | POA: Diagnosis not present

## 2020-01-28 DIAGNOSIS — M6281 Muscle weakness (generalized): Secondary | ICD-10-CM | POA: Diagnosis not present

## 2020-01-28 DIAGNOSIS — M62838 Other muscle spasm: Secondary | ICD-10-CM | POA: Diagnosis not present

## 2020-01-28 DIAGNOSIS — G8929 Other chronic pain: Secondary | ICD-10-CM | POA: Diagnosis not present

## 2020-01-28 DIAGNOSIS — M5030 Other cervical disc degeneration, unspecified cervical region: Secondary | ICD-10-CM | POA: Diagnosis not present

## 2020-01-28 DIAGNOSIS — M2560 Stiffness of unspecified joint, not elsewhere classified: Secondary | ICD-10-CM | POA: Diagnosis not present

## 2020-01-28 DIAGNOSIS — M542 Cervicalgia: Secondary | ICD-10-CM | POA: Diagnosis not present

## 2020-01-28 DIAGNOSIS — M25512 Pain in left shoulder: Secondary | ICD-10-CM | POA: Diagnosis not present

## 2020-02-01 DIAGNOSIS — G8929 Other chronic pain: Secondary | ICD-10-CM | POA: Diagnosis not present

## 2020-02-01 DIAGNOSIS — M542 Cervicalgia: Secondary | ICD-10-CM | POA: Diagnosis not present

## 2020-02-01 DIAGNOSIS — M2560 Stiffness of unspecified joint, not elsewhere classified: Secondary | ICD-10-CM | POA: Diagnosis not present

## 2020-02-01 DIAGNOSIS — M25512 Pain in left shoulder: Secondary | ICD-10-CM | POA: Diagnosis not present

## 2020-02-01 DIAGNOSIS — M5030 Other cervical disc degeneration, unspecified cervical region: Secondary | ICD-10-CM | POA: Diagnosis not present

## 2020-02-01 DIAGNOSIS — M6281 Muscle weakness (generalized): Secondary | ICD-10-CM | POA: Diagnosis not present

## 2020-02-01 DIAGNOSIS — M62838 Other muscle spasm: Secondary | ICD-10-CM | POA: Diagnosis not present

## 2020-02-03 ENCOUNTER — Other Ambulatory Visit: Payer: Self-pay

## 2020-02-03 ENCOUNTER — Ambulatory Visit (INDEPENDENT_AMBULATORY_CARE_PROVIDER_SITE_OTHER): Payer: Medicare Other | Admitting: Physician Assistant

## 2020-02-03 ENCOUNTER — Encounter: Payer: Self-pay | Admitting: Physician Assistant

## 2020-02-03 ENCOUNTER — Ambulatory Visit: Payer: PRIVATE HEALTH INSURANCE

## 2020-02-03 VITALS — BP 123/73 | HR 93 | Ht 65.5 in | Wt 116.0 lb

## 2020-02-03 DIAGNOSIS — Z862 Personal history of diseases of the blood and blood-forming organs and certain disorders involving the immune mechanism: Secondary | ICD-10-CM

## 2020-02-03 DIAGNOSIS — M3500 Sicca syndrome, unspecified: Secondary | ICD-10-CM | POA: Diagnosis not present

## 2020-02-03 DIAGNOSIS — M810 Age-related osteoporosis without current pathological fracture: Secondary | ICD-10-CM

## 2020-02-03 DIAGNOSIS — Z87442 Personal history of urinary calculi: Secondary | ICD-10-CM

## 2020-02-03 DIAGNOSIS — G8929 Other chronic pain: Secondary | ICD-10-CM

## 2020-02-03 DIAGNOSIS — M19041 Primary osteoarthritis, right hand: Secondary | ICD-10-CM

## 2020-02-03 DIAGNOSIS — Z8669 Personal history of other diseases of the nervous system and sense organs: Secondary | ICD-10-CM | POA: Diagnosis not present

## 2020-02-03 DIAGNOSIS — M797 Fibromyalgia: Secondary | ICD-10-CM | POA: Diagnosis not present

## 2020-02-03 DIAGNOSIS — M25561 Pain in right knee: Secondary | ICD-10-CM | POA: Diagnosis not present

## 2020-02-03 DIAGNOSIS — Z8639 Personal history of other endocrine, nutritional and metabolic disease: Secondary | ICD-10-CM | POA: Diagnosis not present

## 2020-02-03 DIAGNOSIS — F5101 Primary insomnia: Secondary | ICD-10-CM

## 2020-02-03 DIAGNOSIS — M7072 Other bursitis of hip, left hip: Secondary | ICD-10-CM

## 2020-02-03 DIAGNOSIS — M5030 Other cervical disc degeneration, unspecified cervical region: Secondary | ICD-10-CM

## 2020-02-03 DIAGNOSIS — Z9049 Acquired absence of other specified parts of digestive tract: Secondary | ICD-10-CM

## 2020-02-03 DIAGNOSIS — M7071 Other bursitis of hip, right hip: Secondary | ICD-10-CM | POA: Diagnosis not present

## 2020-02-03 DIAGNOSIS — M62838 Other muscle spasm: Secondary | ICD-10-CM | POA: Diagnosis not present

## 2020-02-03 DIAGNOSIS — M1711 Unilateral primary osteoarthritis, right knee: Secondary | ICD-10-CM

## 2020-02-03 DIAGNOSIS — M19042 Primary osteoarthritis, left hand: Secondary | ICD-10-CM

## 2020-02-03 DIAGNOSIS — R5383 Other fatigue: Secondary | ICD-10-CM | POA: Diagnosis not present

## 2020-02-03 DIAGNOSIS — Z8659 Personal history of other mental and behavioral disorders: Secondary | ICD-10-CM

## 2020-02-03 MED ORDER — TRIAMCINOLONE ACETONIDE 40 MG/ML IJ SUSP
10.0000 mg | INTRAMUSCULAR | Status: AC | PRN
  Administered 2020-02-03: 10 mg via INTRAMUSCULAR

## 2020-02-03 MED ORDER — LIDOCAINE HCL 1 % IJ SOLN
0.5000 mL | INTRAMUSCULAR | Status: AC | PRN
  Administered 2020-02-03: .5 mL

## 2020-02-03 NOTE — Patient Instructions (Signed)
Journal for Nurse Practitioners, 15(4), 263-267. Retrieved January 06, 2018 from http://clinicalkey.com/nursing">  Knee Exercises Ask your health care provider which exercises are safe for you. Do exercises exactly as told by your health care provider and adjust them as directed. It is normal to feel mild stretching, pulling, tightness, or discomfort as you do these exercises. Stop right away if you feel sudden pain or your pain gets worse. Do not begin these exercises until told by your health care provider. Stretching and range-of-motion exercises These exercises warm up your muscles and joints and improve the movement and flexibility of your knee. These exercises also help to relieve pain and swelling. Knee extension, prone 1. Lie on your abdomen (prone position) on a bed. 2. Place your left / right knee just beyond the edge of the surface so your knee is not on the bed. You can put a towel under your left / right thigh just above your kneecap for comfort. 3. Relax your leg muscles and allow gravity to straighten your knee (extension). You should feel a stretch behind your left / right knee. 4. Hold this position for __________ seconds. 5. Scoot up so your knee is supported between repetitions. Repeat __________ times. Complete this exercise __________ times a day. Knee flexion, active  1. Lie on your back with both legs straight. If this causes back discomfort, bend your left / right knee so your foot is flat on the floor. 2. Slowly slide your left / right heel back toward your buttocks. Stop when you feel a gentle stretch in the front of your knee or thigh (flexion). 3. Hold this position for __________ seconds. 4. Slowly slide your left / right heel back to the starting position. Repeat __________ times. Complete this exercise __________ times a day. Quadriceps stretch, prone  1. Lie on your abdomen on a firm surface, such as a bed or padded floor. 2. Bend your left / right knee and hold  your ankle. If you cannot reach your ankle or pant leg, loop a belt around your foot and grab the belt instead. 3. Gently pull your heel toward your buttocks. Your knee should not slide out to the side. You should feel a stretch in the front of your thigh and knee (quadriceps). 4. Hold this position for __________ seconds. Repeat __________ times. Complete this exercise __________ times a day. Hamstring, supine 1. Lie on your back (supine position). 2. Loop a belt or towel over the ball of your left / right foot. The ball of your foot is on the walking surface, right under your toes. 3. Straighten your left / right knee and slowly pull on the belt to raise your leg until you feel a gentle stretch behind your knee (hamstring). ? Do not let your knee bend while you do this. ? Keep your other leg flat on the floor. 4. Hold this position for __________ seconds. Repeat __________ times. Complete this exercise __________ times a day. Strengthening exercises These exercises build strength and endurance in your knee. Endurance is the ability to use your muscles for a long time, even after they get tired. Quadriceps, isometric This exercise stretches the muscles in front of your thigh (quadriceps) without moving your knee joint (isometric). 1. Lie on your back with your left / right leg extended and your other knee bent. Put a rolled towel or small pillow under your knee if told by your health care provider. 2. Slowly tense the muscles in the front of your left /   right thigh. You should see your kneecap slide up toward your hip or see increased dimpling just above the knee. This motion will push the back of the knee toward the floor. 3. For __________ seconds, hold the muscle as tight as you can without increasing your pain. 4. Relax the muscles slowly and completely. Repeat __________ times. Complete this exercise __________ times a day. Straight leg raises This exercise stretches the muscles in front  of your thigh (quadriceps) and the muscles that move your hips (hip flexors). 1. Lie on your back with your left / right leg extended and your other knee bent. 2. Tense the muscles in the front of your left / right thigh. You should see your kneecap slide up or see increased dimpling just above the knee. Your thigh may even shake a bit. 3. Keep these muscles tight as you raise your leg 4-6 inches (10-15 cm) off the floor. Do not let your knee bend. 4. Hold this position for __________ seconds. 5. Keep these muscles tense as you lower your leg. 6. Relax your muscles slowly and completely after each repetition. Repeat __________ times. Complete this exercise __________ times a day. Hamstring, isometric 1. Lie on your back on a firm surface. 2. Bend your left / right knee about __________ degrees. 3. Dig your left / right heel into the surface as if you are trying to pull it toward your buttocks. Tighten the muscles in the back of your thighs (hamstring) to "dig" as hard as you can without increasing any pain. 4. Hold this position for __________ seconds. 5. Release the tension gradually and allow your muscles to relax completely for __________ seconds after each repetition. Repeat __________ times. Complete this exercise __________ times a day. Hamstring curls If told by your health care provider, do this exercise while wearing ankle weights. Begin with __________ lb weights. Then increase the weight by 1 lb (0.5 kg) increments. Do not wear ankle weights that are more than __________ lb. 1. Lie on your abdomen with your legs straight. 2. Bend your left / right knee as far as you can without feeling pain. Keep your hips flat against the floor. 3. Hold this position for __________ seconds. 4. Slowly lower your leg to the starting position. Repeat __________ times. Complete this exercise __________ times a day. Squats This exercise strengthens the muscles in front of your thigh and knee  (quadriceps). 1. Stand in front of a table, with your feet and knees pointing straight ahead. You may rest your hands on the table for balance but not for support. 2. Slowly bend your knees and lower your hips like you are going to sit in a chair. ? Keep your weight over your heels, not over your toes. ? Keep your lower legs upright so they are parallel with the table legs. ? Do not let your hips go lower than your knees. ? Do not bend lower than told by your health care provider. ? If your knee pain increases, do not bend as low. 3. Hold the squat position for __________ seconds. 4. Slowly push with your legs to return to standing. Do not use your hands to pull yourself to standing. Repeat __________ times. Complete this exercise __________ times a day. Wall slides This exercise strengthens the muscles in front of your thigh and knee (quadriceps). 1. Lean your back against a smooth wall or door, and walk your feet out 18-24 inches (46-61 cm) from it. 2. Place your feet hip-width apart. 3.   Slowly slide down the wall or door until your knees bend __________ degrees. Keep your knees over your heels, not over your toes. Keep your knees in line with your hips. 4. Hold this position for __________ seconds. Repeat __________ times. Complete this exercise __________ times a day. Straight leg raises This exercise strengthens the muscles that rotate the leg at the hip and move it away from your body (hip abductors). 1. Lie on your side with your left / right leg in the top position. Lie so your head, shoulder, knee, and hip line up. You may bend your bottom knee to help you keep your balance. 2. Roll your hips slightly forward so your hips are stacked directly over each other and your left / right knee is facing forward. 3. Leading with your heel, lift your top leg 4-6 inches (10-15 cm). You should feel the muscles in your outer hip lifting. ? Do not let your foot drift forward. ? Do not let your knee  roll toward the ceiling. 4. Hold this position for __________ seconds. 5. Slowly return your leg to the starting position. 6. Let your muscles relax completely after each repetition. Repeat __________ times. Complete this exercise __________ times a day. Straight leg raises This exercise stretches the muscles that move your hips away from the front of the pelvis (hip extensors). 1. Lie on your abdomen on a firm surface. You can put a pillow under your hips if that is more comfortable. 2. Tense the muscles in your buttocks and lift your left / right leg about 4-6 inches (10-15 cm). Keep your knee straight as you lift your leg. 3. Hold this position for __________ seconds. 4. Slowly lower your leg to the starting position. 5. Let your leg relax completely after each repetition. Repeat __________ times. Complete this exercise __________ times a day. This information is not intended to replace advice given to you by your health care provider. Make sure you discuss any questions you have with your health care provider. Document Revised: 01/07/2018 Document Reviewed: 01/07/2018 [employer] Patient Education  2020 [employer]  

## 2020-02-09 DIAGNOSIS — M62838 Other muscle spasm: Secondary | ICD-10-CM | POA: Diagnosis not present

## 2020-02-09 DIAGNOSIS — M5030 Other cervical disc degeneration, unspecified cervical region: Secondary | ICD-10-CM | POA: Diagnosis not present

## 2020-02-09 DIAGNOSIS — M25512 Pain in left shoulder: Secondary | ICD-10-CM | POA: Diagnosis not present

## 2020-02-09 DIAGNOSIS — M2560 Stiffness of unspecified joint, not elsewhere classified: Secondary | ICD-10-CM | POA: Diagnosis not present

## 2020-02-09 DIAGNOSIS — M6281 Muscle weakness (generalized): Secondary | ICD-10-CM | POA: Diagnosis not present

## 2020-02-09 DIAGNOSIS — G8929 Other chronic pain: Secondary | ICD-10-CM | POA: Diagnosis not present

## 2020-02-11 DIAGNOSIS — M2560 Stiffness of unspecified joint, not elsewhere classified: Secondary | ICD-10-CM | POA: Diagnosis not present

## 2020-02-11 DIAGNOSIS — M5030 Other cervical disc degeneration, unspecified cervical region: Secondary | ICD-10-CM | POA: Diagnosis not present

## 2020-02-11 DIAGNOSIS — G8929 Other chronic pain: Secondary | ICD-10-CM | POA: Diagnosis not present

## 2020-02-11 DIAGNOSIS — M6281 Muscle weakness (generalized): Secondary | ICD-10-CM | POA: Diagnosis not present

## 2020-02-11 DIAGNOSIS — M62838 Other muscle spasm: Secondary | ICD-10-CM | POA: Diagnosis not present

## 2020-02-11 DIAGNOSIS — M25512 Pain in left shoulder: Secondary | ICD-10-CM | POA: Diagnosis not present

## 2020-02-16 DIAGNOSIS — M62838 Other muscle spasm: Secondary | ICD-10-CM | POA: Diagnosis not present

## 2020-02-16 DIAGNOSIS — M5030 Other cervical disc degeneration, unspecified cervical region: Secondary | ICD-10-CM | POA: Diagnosis not present

## 2020-02-16 DIAGNOSIS — M6281 Muscle weakness (generalized): Secondary | ICD-10-CM | POA: Diagnosis not present

## 2020-02-16 DIAGNOSIS — M2560 Stiffness of unspecified joint, not elsewhere classified: Secondary | ICD-10-CM | POA: Diagnosis not present

## 2020-02-16 DIAGNOSIS — G8929 Other chronic pain: Secondary | ICD-10-CM | POA: Diagnosis not present

## 2020-02-16 DIAGNOSIS — M25512 Pain in left shoulder: Secondary | ICD-10-CM | POA: Diagnosis not present

## 2020-02-22 DIAGNOSIS — M5030 Other cervical disc degeneration, unspecified cervical region: Secondary | ICD-10-CM | POA: Diagnosis not present

## 2020-02-22 DIAGNOSIS — M25512 Pain in left shoulder: Secondary | ICD-10-CM | POA: Diagnosis not present

## 2020-02-22 DIAGNOSIS — M6281 Muscle weakness (generalized): Secondary | ICD-10-CM | POA: Diagnosis not present

## 2020-02-22 DIAGNOSIS — G8929 Other chronic pain: Secondary | ICD-10-CM | POA: Diagnosis not present

## 2020-02-22 DIAGNOSIS — M62838 Other muscle spasm: Secondary | ICD-10-CM | POA: Diagnosis not present

## 2020-02-22 DIAGNOSIS — M2560 Stiffness of unspecified joint, not elsewhere classified: Secondary | ICD-10-CM | POA: Diagnosis not present

## 2020-02-24 DIAGNOSIS — M5030 Other cervical disc degeneration, unspecified cervical region: Secondary | ICD-10-CM | POA: Diagnosis not present

## 2020-02-24 DIAGNOSIS — M6281 Muscle weakness (generalized): Secondary | ICD-10-CM | POA: Diagnosis not present

## 2020-02-24 DIAGNOSIS — M25512 Pain in left shoulder: Secondary | ICD-10-CM | POA: Diagnosis not present

## 2020-02-24 DIAGNOSIS — M2560 Stiffness of unspecified joint, not elsewhere classified: Secondary | ICD-10-CM | POA: Diagnosis not present

## 2020-02-24 DIAGNOSIS — G8929 Other chronic pain: Secondary | ICD-10-CM | POA: Diagnosis not present

## 2020-02-24 DIAGNOSIS — M62838 Other muscle spasm: Secondary | ICD-10-CM | POA: Diagnosis not present

## 2020-03-01 DIAGNOSIS — M5030 Other cervical disc degeneration, unspecified cervical region: Secondary | ICD-10-CM | POA: Diagnosis not present

## 2020-03-01 DIAGNOSIS — M25512 Pain in left shoulder: Secondary | ICD-10-CM | POA: Diagnosis not present

## 2020-03-01 DIAGNOSIS — G8929 Other chronic pain: Secondary | ICD-10-CM | POA: Diagnosis not present

## 2020-03-01 DIAGNOSIS — M2560 Stiffness of unspecified joint, not elsewhere classified: Secondary | ICD-10-CM | POA: Diagnosis not present

## 2020-03-01 DIAGNOSIS — M62838 Other muscle spasm: Secondary | ICD-10-CM | POA: Diagnosis not present

## 2020-03-01 DIAGNOSIS — M6281 Muscle weakness (generalized): Secondary | ICD-10-CM | POA: Diagnosis not present

## 2020-03-03 DIAGNOSIS — G8929 Other chronic pain: Secondary | ICD-10-CM | POA: Diagnosis not present

## 2020-03-03 DIAGNOSIS — M2560 Stiffness of unspecified joint, not elsewhere classified: Secondary | ICD-10-CM | POA: Diagnosis not present

## 2020-03-03 DIAGNOSIS — M62838 Other muscle spasm: Secondary | ICD-10-CM | POA: Diagnosis not present

## 2020-03-03 DIAGNOSIS — M6281 Muscle weakness (generalized): Secondary | ICD-10-CM | POA: Diagnosis not present

## 2020-03-03 DIAGNOSIS — M5030 Other cervical disc degeneration, unspecified cervical region: Secondary | ICD-10-CM | POA: Diagnosis not present

## 2020-03-03 DIAGNOSIS — M25512 Pain in left shoulder: Secondary | ICD-10-CM | POA: Diagnosis not present

## 2020-03-09 DIAGNOSIS — M6281 Muscle weakness (generalized): Secondary | ICD-10-CM | POA: Diagnosis not present

## 2020-03-09 DIAGNOSIS — M62838 Other muscle spasm: Secondary | ICD-10-CM | POA: Diagnosis not present

## 2020-03-09 DIAGNOSIS — M5030 Other cervical disc degeneration, unspecified cervical region: Secondary | ICD-10-CM | POA: Diagnosis not present

## 2020-03-09 DIAGNOSIS — M2560 Stiffness of unspecified joint, not elsewhere classified: Secondary | ICD-10-CM | POA: Diagnosis not present

## 2020-03-09 DIAGNOSIS — G8929 Other chronic pain: Secondary | ICD-10-CM | POA: Diagnosis not present

## 2020-03-09 DIAGNOSIS — M25512 Pain in left shoulder: Secondary | ICD-10-CM | POA: Diagnosis not present

## 2020-03-15 DIAGNOSIS — G8929 Other chronic pain: Secondary | ICD-10-CM | POA: Diagnosis not present

## 2020-03-15 DIAGNOSIS — M25512 Pain in left shoulder: Secondary | ICD-10-CM | POA: Diagnosis not present

## 2020-03-15 DIAGNOSIS — M5030 Other cervical disc degeneration, unspecified cervical region: Secondary | ICD-10-CM | POA: Diagnosis not present

## 2020-03-15 DIAGNOSIS — M6281 Muscle weakness (generalized): Secondary | ICD-10-CM | POA: Diagnosis not present

## 2020-03-15 DIAGNOSIS — M2560 Stiffness of unspecified joint, not elsewhere classified: Secondary | ICD-10-CM | POA: Diagnosis not present

## 2020-03-15 DIAGNOSIS — M62838 Other muscle spasm: Secondary | ICD-10-CM | POA: Diagnosis not present

## 2020-04-20 NOTE — Progress Notes (Signed)
Office Visit Note  Patient: Danielle Byrd             Date of Birth: 05-25-1955           MRN: [id number]             PCP: Allie Dimmer, MD Referring: Allie Dimmer, MD Visit Date: 05/04/2020 Occupation: '@GUAROCC' @  Subjective:  Neck pain.   History of Present Illness: Danielle Byrd is a 65 y.o. female with history of fibromyalgia, osteoarthritis and osteoporosis.  She states that she fell about a week ago as her dog wrapped leash around her.  She did not have any major injuries but she has been hurting all over.  She would like to have trapezius injections asa she been causing a lot of discomfort in that area.  She has been having lower back pain.  She is having generalized discomfort from fibromyalgia flare.  She has not started the treatment for osteoporosis yet.  She has appointment coming up with her PCP.  Activities of Daily Living:  Patient reports morning stiffness for 2.5 hours.   Patient Reports nocturnal pain.  Difficulty dressing/grooming: Reports Difficulty climbing stairs: Reports Difficulty getting out of chair: Reports Difficulty using hands for taps, buttons, cutlery, and/or writing: Reports  Review of Systems  Constitutional: Positive for fatigue.  HENT: Positive for mouth dryness and nose dryness. Negative for mouth sores.   Eyes: Positive for dryness. Negative for pain, itching and visual disturbance.  Respiratory: Negative for cough, hemoptysis, shortness of breath and difficulty breathing.   Cardiovascular: Positive for swelling in legs/feet. Negative for chest pain and palpitations.  Gastrointestinal: Positive for constipation. Negative for abdominal pain, blood in stool and diarrhea.  Endocrine: Negative for increased urination.  Genitourinary: Negative for painful urination.  Musculoskeletal: Positive for arthralgias, joint pain, myalgias, morning stiffness, muscle tenderness and myalgias. Negative for joint swelling and muscle weakness.  Skin:  Negative for color change, rash and redness.  Allergic/Immunologic: Negative for susceptible to infections.  Neurological: Positive for dizziness, numbness, headaches and weakness. Negative for memory loss.  Hematological: Negative for swollen glands.  Psychiatric/Behavioral: Positive for sleep disturbance. Negative for confusion.    PMFS History:  Patient Active Problem List   Diagnosis Date Noted  . Left thigh pain 05/05/2019  . 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 Hx of colonic polyps 09/05/2017  . Family hx of colon cancer 09/05/2017  . Migraine without aura and without status migrainosus, not intractable 03/04/2017  . Fibromyalgia 11/22/2016  . Other fatigue 11/22/2016  . DDD (degenerative disc disease), cervical 11/22/2016  . Primary osteoarthritis of both hands 11/22/2016  . History of migraine 11/22/2016  . History of anxiety 11/22/2016  . History of hypothyroidism 11/22/2016  . History of kidney stones 11/22/2016  . History of cholecystectomy 11/22/2016  . History of anemia 11/22/2016  . Hx of bone density study/ normal 2014 this is followed by her Primary Care  11/22/2016  . Sicca syndrome ([place]) 11/22/2016  . 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 Headache(784.0) 10/27/2013  . Leukopenia 07/08/2013  . Anemia 10/31/2011  . Iron deficiency 10/31/2011  . GERD (gastroesophageal reflux disease) 05/14/2011  . Abdominal pain, bilateral upper quadrant 05/14/2011  . H/O fibromyalgia 05/14/2011  . Constipation 05/14/2011  . Hyperlipemia 05/14/2011  . Kidney stones 01/11/2011    Past Medical History:  Diagnosis Date  . Anemia   . Anxiety   . Arthritis   . Cervical stenosis (uterine cervix)   . Closed fracture of  left foot   . Constipation   . Copper deficiency   . Depression   . Fibromyalgia   . GERD (gastroesophageal reflux disease)   . High cholesterol   . History of kidney stones   . Hyperlipidemia   . Hypothyroidism   . Kidney stones   . Lumbar stenosis  11/27/2016  . Melanosis coli   . Migraines   . Nephrolithiasis   . Osteopenia 12/2019   Per patient, diagnosed by PCP Allie Dimmer, MD  . Osteoporosis 12/2019   Per patient, diagnosed by PCP Allie Dimmer, MD  . Panic attack   . Rosacea   . Sliding hiatal hernia   . Spondylosis, cervical     Family History  Problem Relation Age of Onset  . Diabetes Mother   . Hypertension Mother   . Anemia Mother   . Colon cancer Father   . Cancer Father 16       colon cancer  . Heart attack Father    Past Surgical History:  Procedure Laterality Date  . ABDOMINAL HYSTERECTOMY    . BONE MARROW BIOPSY     & aspirate  . BREAST SURGERY Right    partial mastectomy  . CHOLECYSTECTOMY    . COLONOSCOPY N/A 07/22/2012   Procedure: COLONOSCOPY;  Surgeon: [name], MD;  Location: [facility];  Service: Endoscopy;  Laterality: N/A;  730  . COLONOSCOPY WITH PROPOFOL N/A 10/25/2017   Procedure: COLONOSCOPY WITH PROPOFOL;  Surgeon: [name], MD;  Location: [facility];  Service: Endoscopy;  Laterality: N/A;  10:30  . DILATION AND CURETTAGE OF UTERUS    . EYE SURGERY     sty removal and reconstruction of eye lids.  Marland Kitchen NISSEN FUNDOPLICATION    . POLYPECTOMY  10/25/2017   Procedure: POLYPECTOMY;  Surgeon: [name], MD;  Location: [facility];  Service: Endoscopy;;  sigmoid    Social History   Social History Narrative   Patient lives at home with chacha (boxer, therapy dog)   Patient right handed   Patient has a BS degree.   Patient drinks 2 cups of tea daily.    There is no immunization history on file for this patient.   Objective: Vital Signs: BP (!) 146/79 (BP Location: Left Arm, Patient Position: Sitting, Cuff Size: Normal)   Pulse 80   Ht 5' 3.5" (1.613 m)   Wt 114 lb 12.8 oz (52.1 kg)   BMI 20.02 kg/m    Physical Exam Vitals and nursing note reviewed.  Constitutional:      Appearance: She is well-developed and well-nourished.  HENT:     Head:  Normocephalic and atraumatic.  Eyes:     Extraocular Movements: EOM normal.     Conjunctiva/sclera: Conjunctivae normal.  Cardiovascular:     Rate and Rhythm: Normal rate and regular rhythm.     Pulses: Intact distal pulses.     Heart sounds: Normal heart sounds.  Pulmonary:     Effort: Pulmonary effort is normal.     Breath sounds: Normal breath sounds.  Abdominal:     General: Bowel sounds are normal.     Palpations: Abdomen is soft.  Musculoskeletal:     Cervical back: Normal range of motion.  Lymphadenopathy:     Cervical: No cervical adenopathy.  Skin:    General: Skin is warm and dry.     Capillary Refill: Capillary refill takes less than 2 seconds.  Neurological:     Mental Status: She  is alert and oriented to person, place, and time.  Psychiatric:        Mood and Affect: Mood and affect normal.        Behavior: Behavior normal.      Musculoskeletal Exam: C-spine was in good range of motion. She had discomfort with range of motion of her cervical spine. She had bilateral trapezius spasm. She had painful range of motion of her lumbar spine with paraspinal muscle tightness. Shoulder joints, elbow joints, wrist joints, MCPs PIPs and DIPs with good range of motion with no synovitis. She had good range of motion of her hip joints and knee joints. There was no tenderness over ankles or MTPs. She generalized hyperalgesia and positive tender points.  CDAI Exam: CDAI Score: -- Patient Global: --; Provider Global: -- Swollen: --; Tender: -- Joint Exam 05/04/2020   No joint exam has been documented for this visit   There is currently no information documented on the homunculus. Go to the Rheumatology activity and complete the homunculus joint exam.  Investigation: No additional findings.  Imaging: No results found.  Recent Labs: Lab Results  Component Value Date   WBC 3.3 (L) 10/30/2017   HGB 11.0 (L) 10/30/2017   PLT 169 10/30/2017   NA 139 10/30/2017   K 4.0  10/30/2017   CL 103 10/30/2017   CO2 30 10/30/2017   GLUCOSE 111 (H) 10/30/2017   BUN 8 10/30/2017   CREATININE 0.73 10/30/2017   BILITOT 0.4 10/30/2017   ALKPHOS 72 10/30/2017   AST 23 10/30/2017   ALT 20 10/30/2017   PROT 6.4 (L) 10/30/2017   ALBUMIN 3.9 10/30/2017   CALCIUM 9.0 10/30/2017   GFRAA >60 10/30/2017    Speciality Comments: No specialty comments available.  Procedures:  Trigger Point Inj  Date/Time: 05/04/2020 3:46 PM Performed by: Bo Merino, MD Authorized by: Bo Merino, MD   Consent Given by:  Patient Site marked: the procedure site was marked   Timeout: prior to procedure the correct patient, procedure, and site was verified   Indications:  Muscle spasm and pain Total # of Trigger Points:  2 Location: neck   Needle Size:  27 G Approach:  Dorsal Medications #1:  0.5 mL lidocaine 1 %; 10 mg triamcinolone acetonide 40 MG/ML Medications #2:  0.5 mL lidocaine 1 %; 10 mg triamcinolone acetonide 40 MG/ML Patient tolerance:  Patient tolerated the procedure well with no immediate complications   Allergies: Oxycodone, Reglan [metoclopramide], Tramadol hcl, Codeine, Cymbalta [duloxetine hcl], Eggs or egg-derived products, Gabapentin, Hydrocodone-acetaminophen, Penicillins, Savella [milnacipran hcl], Statins, Voltaren [diclofenac sodium], Zanaflex [tizanidine hcl], and Lyrica [pregabalin]   Assessment / Plan:     Visit Diagnoses: Fibromyalgia-she is having a flare of fibromyalgia with generalized pain and discomfort. She has positive tender points.  Primary insomnia -she is on trazodone 50 mg 1 tablet by mouth at bedtime which helps. Good sleep hygiene was discussed  Other fatigue-related to fibromyalgia and insomnia.  Trapezius muscle spasm-she had bilateral trapezius spasm. Per her request bilateral trapezius area were injected with cortisone as described above. She tolerated the procedure well. Postprocedure instructions were given.  Ischial  bursitis of left side and ischial bursitis, right-she has been using a cushion. She continues to have some discomfort.  Sicca syndrome ([facility])-over-the-counter products were discussed.  Primary osteoarthritis of both hands-joint protection was discussed.  DDD (degenerative disc disease), cervical-she continues to have some neck discomfort.  Age-related osteoporosis without current pathological fracture - DEXA ordered by PCP and updated on  12/14/19. Patient states that she will discuss different treatment options with Dr. Jenean Lindau at the follow-up visit. She is hesitant to go on bisphosphonates.  History of anemia  History of migraine  History of cholecystectomy  History of anxiety  History of hypothyroidism  History of kidney stones  She is concerned about the side effects from COVID-19 vaccine and does not want to be vaccinated. Use of mask, social distancing and hand hygiene discussed.  Orders: Orders Placed This Encounter  Procedures  . Trigger Point Inj   No orders of the defined types were placed in this encounter.   Follow-Up Instructions: Return in about 3 months (around 08/01/2020) for Osteoarthritis, FMS, Osteoporosis.   Bo Merino, MD  Note - This record has been created using Editor, commissioning.  Chart creation errors have been sought, but may not always  have been located. Such creation errors do not reflect on  the standard of medical care.

## 2020-05-04 ENCOUNTER — Encounter: Payer: Self-pay | Admitting: Rheumatology

## 2020-05-04 ENCOUNTER — Ambulatory Visit (INDEPENDENT_AMBULATORY_CARE_PROVIDER_SITE_OTHER): Payer: Medicare Other | Admitting: Rheumatology

## 2020-05-04 ENCOUNTER — Other Ambulatory Visit: Payer: Self-pay

## 2020-05-04 VITALS — BP 146/79 | HR 80 | Ht 63.5 in | Wt 114.8 lb

## 2020-05-04 DIAGNOSIS — M19042 Primary osteoarthritis, left hand: Secondary | ICD-10-CM

## 2020-05-04 DIAGNOSIS — M62838 Other muscle spasm: Secondary | ICD-10-CM

## 2020-05-04 DIAGNOSIS — M797 Fibromyalgia: Secondary | ICD-10-CM | POA: Diagnosis not present

## 2020-05-04 DIAGNOSIS — Z9049 Acquired absence of other specified parts of digestive tract: Secondary | ICD-10-CM

## 2020-05-04 DIAGNOSIS — Z862 Personal history of diseases of the blood and blood-forming organs and certain disorders involving the immune mechanism: Secondary | ICD-10-CM

## 2020-05-04 DIAGNOSIS — M3500 Sicca syndrome, unspecified: Secondary | ICD-10-CM

## 2020-05-04 DIAGNOSIS — F5101 Primary insomnia: Secondary | ICD-10-CM

## 2020-05-04 DIAGNOSIS — M7071 Other bursitis of hip, right hip: Secondary | ICD-10-CM

## 2020-05-04 DIAGNOSIS — Z8669 Personal history of other diseases of the nervous system and sense organs: Secondary | ICD-10-CM

## 2020-05-04 DIAGNOSIS — Z87442 Personal history of urinary calculi: Secondary | ICD-10-CM

## 2020-05-04 DIAGNOSIS — M810 Age-related osteoporosis without current pathological fracture: Secondary | ICD-10-CM

## 2020-05-04 DIAGNOSIS — M5030 Other cervical disc degeneration, unspecified cervical region: Secondary | ICD-10-CM

## 2020-05-04 DIAGNOSIS — Z8659 Personal history of other mental and behavioral disorders: Secondary | ICD-10-CM

## 2020-05-04 DIAGNOSIS — R5383 Other fatigue: Secondary | ICD-10-CM | POA: Diagnosis not present

## 2020-05-04 DIAGNOSIS — Z8639 Personal history of other endocrine, nutritional and metabolic disease: Secondary | ICD-10-CM

## 2020-05-04 DIAGNOSIS — M7072 Other bursitis of hip, left hip: Secondary | ICD-10-CM

## 2020-05-04 DIAGNOSIS — M19041 Primary osteoarthritis, right hand: Secondary | ICD-10-CM

## 2020-05-04 MED ORDER — LIDOCAINE HCL 1 % IJ SOLN
0.5000 mL | INTRAMUSCULAR | Status: AC | PRN
  Administered 2020-05-04: .5 mL

## 2020-05-04 MED ORDER — TRIAMCINOLONE ACETONIDE 40 MG/ML IJ SUSP
10.0000 mg | INTRAMUSCULAR | Status: AC | PRN
  Administered 2020-05-04: 10 mg via INTRAMUSCULAR

## 2020-07-20 NOTE — Progress Notes (Addendum)
Office Visit Note  Patient: Danielle Byrd             Date of Birth: 05/30/1955           MRN: [id number]             PCP: Allie Dimmer, MD Referring: Allie Dimmer, MD Visit Date: 08/03/2020 Occupation: '@GUAROCC' @  Subjective:  Trapezius muscle tension and tenderness   History of Present Illness: ALYZABETH Byrd is a 65 y.o. female with history of fibromyalgia, osteoarthritis, and osteoporosis.  Patient presents today with trapezius muscle tension and muscle tenderness bilaterally.  She has been experiencing muscle spasms intermittently.  She requested trigger point injections today.  She continues to use her TENS unit on a daily basis.  Since her last office visit she has started to take a magnesium supplement over-the-counter which has improved some of her myalgias.  She continues to have discomfort in both legs at night.  She denies any muscle cramps currently.  She continues to take methocarbamol 500 mg 3 times daily as needed for muscle spasms.  She states that her right knee joint pain has improved.  She continues to have discomfort due to trochanter bursitis and ischial bursitis bilaterally.  She uses a cushion for more support when sitting.  She has intermittent discomfort in her lower back but no symptoms of radiculopathy at this time.  Over the past 6 weeks she has been experiencing increased pain in her right wrist.  She denies any injury prior to the onset of symptoms.  She has noticed some weakness and has been dropping things more frequently.  She denies any swelling in the right wrist at this time.  She has been using Voltaren gel topically as needed for pain relief.   Activities of Daily Living:  Patient reports morning stiffness for 2-3 hours.   Patient Reports nocturnal pain.  Difficulty dressing/grooming: Denies Difficulty climbing stairs: Reports Difficulty getting out of chair: Denies Difficulty using hands for taps, buttons, cutlery, and/or writing:  Reports  Review of Systems  Constitutional: Positive for fatigue.  HENT: Positive for mouth dryness and nose dryness. Negative for mouth sores.   Eyes: Positive for itching and dryness. Negative for pain.  Respiratory: Negative for shortness of breath and difficulty breathing.   Cardiovascular: Negative for palpitations.  Gastrointestinal: Positive for constipation. Negative for blood in stool and diarrhea.  Endocrine: Negative for increased urination.  Genitourinary: Negative for difficulty urinating.  Musculoskeletal: Positive for arthralgias, joint pain, myalgias, morning stiffness, muscle tenderness and myalgias. Negative for joint swelling.  Skin: Negative for color change, rash and redness.  Allergic/Immunologic: Negative for susceptible to infections.  Neurological: Positive for dizziness, headaches and weakness. Negative for numbness and memory loss.  Hematological: Positive for bruising/bleeding tendency.  Psychiatric/Behavioral: Positive for sleep disturbance. Negative for confusion.    PMFS History:  Patient Active Problem List   Diagnosis Date Noted  . Left thigh pain 05/05/2019  . 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 Hx of colonic polyps 09/05/2017  . Family hx of colon cancer 09/05/2017  . Migraine without aura and without status migrainosus, not intractable 03/04/2017  . Fibromyalgia 11/22/2016  . Other fatigue 11/22/2016  . DDD (degenerative disc disease), cervical 11/22/2016  . Primary osteoarthritis of both hands 11/22/2016  . History of migraine 11/22/2016  . History of anxiety 11/22/2016  . History of hypothyroidism 11/22/2016  . History of kidney stones 11/22/2016  . History of cholecystectomy 11/22/2016  . History of anemia  11/22/2016  . Hx of bone density study/ normal 2014 this is followed by her Primary Care  11/22/2016  . Sicca syndrome ([place]) 11/22/2016  . 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 Headache(784.0) 10/27/2013  . Leukopenia 07/08/2013   . Anemia 10/31/2011  . Iron deficiency 10/31/2011  . GERD (gastroesophageal reflux disease) 05/14/2011  . Abdominal pain, bilateral upper quadrant 05/14/2011  . H/O fibromyalgia 05/14/2011  . Constipation 05/14/2011  . Hyperlipemia 05/14/2011  . Kidney stones 01/11/2011    Past Medical History:  Diagnosis Date  . Anemia   . Anxiety   . Arthritis   . Cervical stenosis (uterine cervix)   . Closed fracture of left foot   . Constipation   . Copper deficiency   . Depression   . Fibromyalgia   . GERD (gastroesophageal reflux disease)   . High cholesterol   . History of kidney stones   . Hyperlipidemia   . Hypothyroidism   . Kidney stones   . Lumbar stenosis 11/27/2016  . Melanosis coli   . Migraines   . Nephrolithiasis   . Osteopenia 12/2019   Per patient, diagnosed by PCP Allie Dimmer, MD  . Osteoporosis 12/2019   Per patient, diagnosed by PCP Allie Dimmer, MD  . Panic attack   . Rosacea   . Sliding hiatal hernia   . Spondylosis, cervical     Family History  Problem Relation Age of Onset  . Diabetes Mother   . Hypertension Mother   . Anemia Mother   . Colon cancer Father   . Cancer Father 4       colon cancer  . Heart attack Father    Past Surgical History:  Procedure Laterality Date  . ABDOMINAL HYSTERECTOMY    . BONE MARROW BIOPSY     & aspirate  . BREAST SURGERY Right    partial mastectomy  . CHOLECYSTECTOMY    . COLONOSCOPY N/A 07/22/2012   Procedure: COLONOSCOPY;  Surgeon: [name], MD;  [facility];  Service: Endoscopy;  Laterality: N/A;  730  . COLONOSCOPY WITH PROPOFOL N/A 10/25/2017   Procedure: COLONOSCOPY WITH PROPOFOL;  Surgeon: [name], MD;  [facility]: [facility];  Service: Endoscopy;  Laterality: N/A;  10:30  . DILATION AND CURETTAGE OF UTERUS    . EYE SURGERY     sty removal and reconstruction of eye lids.  Marland Kitchen NISSEN FUNDOPLICATION    . POLYPECTOMY  10/25/2017   Procedure: POLYPECTOMY;  Surgeon: [name], MD;  [facility];  Service: Endoscopy;;  sigmoid    Social History   Social History Narrative   Patient lives at home with chacha (boxer, therapy dog)   Patient right handed   Patient has a BS degree.   Patient drinks 2 cups of tea daily.    There is no immunization history on file for this patient.   Objective: Vital Signs: BP 124/78 (BP Location: Left Arm, Patient Position: Sitting, Cuff Size: Normal)   Pulse 71   Ht 5' 5.5" (1.664 m)   Wt 118 lb 12.8 oz (53.9 kg)   BMI 19.47 kg/m    Physical Exam Vitals and nursing note reviewed.  Constitutional:      Appearance: She is well-developed.  HENT:     Head: Normocephalic and atraumatic.  Eyes:     Conjunctiva/sclera: Conjunctivae normal.  Pulmonary:     Effort: Pulmonary effort is normal.  Abdominal:     Palpations: Abdomen is soft.  Musculoskeletal:  Cervical back: Normal range of motion.  Skin:    General: Skin is warm and dry.     Capillary Refill: Capillary refill takes less than 2 seconds.  Neurological:     Mental Status: She is alert and oriented to person, place, and time.  Psychiatric:        Behavior: Behavior normal.      Musculoskeletal Exam: Generalized hyperalgesia and positive tender points.  C-spine is slightly limited range of motion with lateral rotation.  Trapezius muscle tension and muscle tenderness bilaterally.  Painful range of motion of the lumbar spine.  Shoulder joints, elbow joints, wrist joints, MCPs, PIPs, DIPs have good range of motion with no synovitis.  Tenderness along the ulnar aspect of the right wrist.  PIP and DIP thickening consistent with osteoarthritis of both hands.  Complete fist formation bilaterally.  Hip joints have good range of motion with discomfort bilaterally.  Knee joints have good range of motion with no warmth or effusion.  Ankle joints have good range of motion with no tenderness or swelling.  CDAI Exam: CDAI Score: -- Patient Global: --;  Provider Global: -- Swollen: --; Tender: -- Joint Exam 08/03/2020   No joint exam has been documented for this visit   There is currently no information documented on the homunculus. Go to the Rheumatology activity and complete the homunculus joint exam.  Investigation: No additional findings.  Imaging: XR Wrist Complete Right  Result Date: 08/25/2020 [facility] narrowing was noted.  No intercarpal or radiocarpal joint space narrowing was noted.  No erosive changes were noted. Impression: CMC arthritis was noted.  XR Cervical Spine 2 or 3 views  Result Date: 08/25/2020 Multilevel spondylosis with most significant narrowing of C5-C6.  Facet joint arthropathy was noted. Impression: These findings are consistent with the multilevel spondylosis and facet joint arthropathy.   Recent Labs: Lab Results  Component Value Date   WBC 3.3 (L) 10/30/2017   HGB 11.0 (L) 10/30/2017   PLT 169 10/30/2017   NA 139 10/30/2017   K 4.0 10/30/2017   CL 103 10/30/2017   CO2 30 10/30/2017   GLUCOSE 111 (H) 10/30/2017   BUN 8 10/30/2017   CREATININE 0.73 10/30/2017   BILITOT 0.4 10/30/2017   ALKPHOS 72 10/30/2017   AST 23 10/30/2017   ALT 20 10/30/2017   PROT 6.4 (L) 10/30/2017   ALBUMIN 3.9 10/30/2017   CALCIUM 9.0 10/30/2017   GFRAA >60 10/30/2017    Speciality Comments: No specialty comments available.  Procedures:  Trigger Point Inj  Date/Time: 08/25/2020 4:33 PM Performed by: Ofilia Neas, PA-C Authorized by: Ofilia Neas, PA-C   Consent Given by:  Patient Site marked: the procedure site was marked   Timeout: prior to procedure the correct patient, procedure, and site was verified   Indications:  Pain Total # of Trigger Points:  2 Location: neck   Needle Size:  27 G Approach:  Dorsal Medications #1:  0.5 mL lidocaine 1 %; 10 mg triamcinolone acetonide 40 MG/ML Medications #2:  0.5 mL lidocaine 1 %; 10 mg triamcinolone acetonide 40 MG/ML Patient tolerance:  Patient tolerated the  procedure well with no immediate complications   Allergies: Oxycodone, Reglan [metoclopramide], Tramadol hcl, Codeine, Cymbalta [duloxetine hcl], Eggs or egg-derived products, Gabapentin, Hydrocodone-acetaminophen, Penicillins, Savella [milnacipran hcl], Statins, Voltaren [diclofenac sodium], Zanaflex [tizanidine hcl], and Lyrica [pregabalin]   Assessment / Plan:     Visit Diagnoses: Fibromyalgia: She has generalized hyperalgesia and positive tender points on examination today.  She  continues to have generalized myalgias and muscle tenderness due to underlying fibromyalgia.  Since her last office visit she started taking magnesium 400 mg daily which has improved some of her myalgias.  She continues to have muscle spasms intermittently and takes methocarbamol 500 mg 3 times daily as needed for symptomatic relief.  She presents today with trapezius muscle tension and muscle tenderness bilaterally.  She has been using a TENS unit on a daily basis for relief.  She requested trigger point injections today.  She tolerated the procedure well.  Procedure note was completed above.  Aftercare was discussed.  We discussed the importance of performing stretching exercises on a daily basis. She will follow up in 3 months.   Primary insomnia: She takes trazodone 50 mg 1 tablet at bedtime as needed for insomnia.  She continues to have interrupted sleep at night due to nocturnal pain.  She typically sleeps about 4 hours at a time.  We discussed the importance of good sleep hygiene.   Other fatigue: Chronic and secondary to insomnia.  We discussed the importance of regular exercise.  Trapezius muscle spasm: She presents today with trapezius muscle tension and muscle tenderness bilaterally.  She experiences muscle spasms intermittently.  She takes her methocarbamol 500 mg 3 times daily as needed for relief.  She is also been using her TENS unit on a daily basis.  She requested trigger point injections today.  She  tolerated the procedures well.  Procedure notes were completed above.  Aftercare was discussed.  She was encouraged to perform neck exercises on a daily basis.  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  She uses a cushion for extra support and has to change positions frequently for relief.  Ischial bursitis, right: Chronic pain.  She uses a cushion for extra support.  Sicca syndrome [facility]): She uses Systane eyedrops at bedtime for symptomatic relief.  Primary osteoarthritis of both hands: She has mild PIP and DIP thickening consistent with osteoarthritis of both hands.  She has tenderness on the ulnar aspect of the right wrist but no synovitis was noted.  She was able to make a complete fist bilaterally. She declined x-rays today.   Discussed the importance of joint protection and muscle strengthening.  DDD (degenerative disc disease), cervical: She has limited range of motion with lateral rotation.  No symptoms of radiculopathy.  Age-related osteoporosis without current pathological fracture: She is taking a calcium and vitamin D supplement on a daily basis.  She has not had any recent fractures.  Other medical conditions are listed as follows:  History of anemia  History of migraine  History of cholecystectomy  History of anxiety  History of hypothyroidism  History of kidney stones  Orders: Orders Placed This Encounter  Procedures  . Trigger Point Inj   No orders of the defined types were placed in this encounter.     Follow-Up Instructions: Return in about 3 months (around 11/03/2020) for Fibromyalgia, Osteoarthritis, Osteoporosis.   Ofilia Neas, PA-C  Note - This record has been created using Dragon software.  Chart creation errors have been sought, but may not always  have been located. Such creation errors do not reflect on  the standard of medical care.

## 2020-08-03 ENCOUNTER — Ambulatory Visit (INDEPENDENT_AMBULATORY_CARE_PROVIDER_SITE_OTHER): Payer: Medicare Other | Admitting: Physician Assistant

## 2020-08-03 ENCOUNTER — Encounter: Payer: Self-pay | Admitting: Physician Assistant

## 2020-08-03 ENCOUNTER — Other Ambulatory Visit: Payer: Self-pay

## 2020-08-03 VITALS — BP 124/78 | HR 71 | Ht 65.5 in | Wt 118.8 lb

## 2020-08-03 DIAGNOSIS — M3500 Sicca syndrome, unspecified: Secondary | ICD-10-CM

## 2020-08-03 DIAGNOSIS — M797 Fibromyalgia: Secondary | ICD-10-CM

## 2020-08-03 DIAGNOSIS — M7071 Other bursitis of hip, right hip: Secondary | ICD-10-CM

## 2020-08-03 DIAGNOSIS — R5383 Other fatigue: Secondary | ICD-10-CM

## 2020-08-03 DIAGNOSIS — M5030 Other cervical disc degeneration, unspecified cervical region: Secondary | ICD-10-CM

## 2020-08-03 DIAGNOSIS — M62838 Other muscle spasm: Secondary | ICD-10-CM

## 2020-08-03 DIAGNOSIS — F5101 Primary insomnia: Secondary | ICD-10-CM | POA: Diagnosis not present

## 2020-08-03 DIAGNOSIS — M810 Age-related osteoporosis without current pathological fracture: Secondary | ICD-10-CM

## 2020-08-03 DIAGNOSIS — M19041 Primary osteoarthritis, right hand: Secondary | ICD-10-CM

## 2020-08-03 DIAGNOSIS — M19042 Primary osteoarthritis, left hand: Secondary | ICD-10-CM

## 2020-08-03 DIAGNOSIS — Z8659 Personal history of other mental and behavioral disorders: Secondary | ICD-10-CM

## 2020-08-03 DIAGNOSIS — M7072 Other bursitis of hip, left hip: Secondary | ICD-10-CM

## 2020-08-03 DIAGNOSIS — Z8639 Personal history of other endocrine, nutritional and metabolic disease: Secondary | ICD-10-CM

## 2020-08-03 DIAGNOSIS — Z87442 Personal history of urinary calculi: Secondary | ICD-10-CM

## 2020-08-03 DIAGNOSIS — Z9049 Acquired absence of other specified parts of digestive tract: Secondary | ICD-10-CM

## 2020-08-03 DIAGNOSIS — Z862 Personal history of diseases of the blood and blood-forming organs and certain disorders involving the immune mechanism: Secondary | ICD-10-CM

## 2020-08-03 DIAGNOSIS — Z8669 Personal history of other diseases of the nervous system and sense organs: Secondary | ICD-10-CM

## 2020-08-15 ENCOUNTER — Telehealth: Payer: Self-pay

## 2020-08-15 NOTE — Telephone Encounter (Signed)
Danielle Byrd performed bilateral trigger point trapezius muscle injection at 08/03/2020 visit. Please advise.

## 2020-08-15 NOTE — Telephone Encounter (Signed)
I called patient f/u appt 08/22/2020

## 2020-08-15 NOTE — Telephone Encounter (Signed)
Please a schedule an appointment next week.

## 2020-08-15 NOTE — Telephone Encounter (Signed)
Patient called stating over the weekend her right wrist pain increased with shooting pain up her arm into her neck and shoulder.  Patient states she had an appointment on 08/03/20 and Lovena Le wanted her to have x-rays, but patient declined and now is worried she made a mistake.  Patient requested a return call.

## 2020-08-15 NOTE — Progress Notes (Deleted)
Office Visit Note  Patient: Danielle Byrd             Date of Birth: 12/21/1955           MRN: [id number]             PCP: Allie Dimmer, MD Referring: Allie Dimmer, MD Visit Date: 08/22/2020 Occupation: _0 @  Subjective:  No chief complaint on file.   History of Present Illness: Danielle Byrd is a 65 y.o. female ***   Activities of Daily Living:  Patient reports morning stiffness for *** {minute/hour:19697}.   Patient {ACTIONS;DENIES/REPORTS:21021675::"Denies"} nocturnal pain.  Difficulty dressing/grooming: {ACTIONS;DENIES/REPORTS:21021675::"Denies"} Difficulty climbing stairs: {ACTIONS;DENIES/REPORTS:21021675::"Denies"} Difficulty getting out of chair: {ACTIONS;DENIES/REPORTS:21021675::"Denies"} Difficulty using hands for taps, buttons, cutlery, and/or writing: {ACTIONS;DENIES/REPORTS:21021675::"Denies"}  No Rheumatology ROS completed.   PMFS History:  Patient Active Problem List   Diagnosis Date Noted  . Left thigh pain 05/05/2019  . 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 Hx of colonic polyps 09/05/2017  . Family hx of colon cancer 09/05/2017  . Migraine without aura and without status migrainosus, not intractable 03/04/2017  . Fibromyalgia 11/22/2016  . Other fatigue 11/22/2016  . DDD (degenerative disc disease), cervical 11/22/2016  . Primary osteoarthritis of both hands 11/22/2016  . History of migraine 11/22/2016  . History of anxiety 11/22/2016  . History of hypothyroidism 11/22/2016  . History of kidney stones 11/22/2016  . History of cholecystectomy 11/22/2016  . History of anemia 11/22/2016  . Hx of bone density study/ normal 2014 this is followed by her Primary Care  11/22/2016  . Sicca syndrome ([place]) 11/22/2016  . 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 Headache(784.0) 10/27/2013  . Leukopenia 07/08/2013  . Anemia 10/31/2011  . Iron deficiency 10/31/2011  . GERD (gastroesophageal reflux disease) 05/14/2011  . Abdominal pain,  bilateral upper quadrant 05/14/2011  . H/O fibromyalgia 05/14/2011  . Constipation 05/14/2011  . Hyperlipemia 05/14/2011  . Kidney stones 01/11/2011    Past Medical History:  Diagnosis Date  . Anemia   . Anxiety   . Arthritis   . Cervical stenosis (uterine cervix)   . Closed fracture of left foot   . Constipation   . Copper deficiency   . Depression   . Fibromyalgia   . GERD (gastroesophageal reflux disease)   . High cholesterol   . History of kidney stones   . Hyperlipidemia   . Hypothyroidism   . Kidney stones   . Lumbar stenosis 11/27/2016  . Melanosis coli   . Migraines   . Nephrolithiasis   . Osteopenia 12/2019   Per patient, diagnosed by PCP Allie Dimmer, MD  . Osteoporosis 12/2019   Per patient, diagnosed by PCP Allie Dimmer, MD  . Panic attack   . Rosacea   . Sliding hiatal hernia   . Spondylosis, cervical     Family History  Problem Relation Age of Onset  . Diabetes Mother   . Hypertension Mother   . Anemia Mother   . Colon cancer Father   . Cancer Father 17       colon cancer  . Heart attack Father    Past Surgical History:  Procedure Laterality Date  . ABDOMINAL HYSTERECTOMY    . BONE MARROW BIOPSY     & aspirate  . BREAST SURGERY Right    partial mastectomy  . CHOLECYSTECTOMY    . COLONOSCOPY N/A 07/22/2012   Procedure: COLONOSCOPY;  Surgeon: [name], MD;  [facility];  Service: Endoscopy;  Laterality: N/A;  730  . COLONOSCOPY WITH PROPOFOL N/A 10/25/2017   Procedure: COLONOSCOPY WITH PROPOFOL;  Surgeon: [name], MD;  [facility];  Service: Endoscopy;  Laterality: N/A;  10:30  . DILATION AND CURETTAGE OF UTERUS    . EYE SURGERY     sty removal and reconstruction of eye lids.  Marland Kitchen NISSEN FUNDOPLICATION    . POLYPECTOMY  10/25/2017   Procedure: POLYPECTOMY;  Surgeon: [name], MD;  [facility];  Service: Endoscopy;;  sigmoid    Social History   Social History Narrative   Patient  lives at home with chacha (boxer, therapy dog)   Patient right handed   Patient has a BS degree.   Patient drinks 2 cups of tea daily.    There is no immunization history on file for this patient.   Objective: Vital Signs: There were no vitals taken for this visit.   Physical Exam   Musculoskeletal Exam: ***  CDAI Exam: CDAI Score: -- Patient Global: --; Provider Global: -- Swollen: --; Tender: -- Joint Exam 08/22/2020   No joint exam has been documented for this visit   There is currently no information documented on the homunculus. Go to the Rheumatology activity and complete the homunculus joint exam.  Investigation: No additional findings.  Imaging: No results found.  Recent Labs: Lab Results  Component Value Date   WBC 3.3 (L) 10/30/2017   HGB 11.0 (L) 10/30/2017   PLT 169 10/30/2017   NA 139 10/30/2017   K 4.0 10/30/2017   CL 103 10/30/2017   CO2 30 10/30/2017   GLUCOSE 111 (H) 10/30/2017   BUN 8 10/30/2017   CREATININE 0.73 10/30/2017   BILITOT 0.4 10/30/2017   ALKPHOS 72 10/30/2017   AST 23 10/30/2017   ALT 20 10/30/2017   PROT 6.4 (L) 10/30/2017   ALBUMIN 3.9 10/30/2017   CALCIUM 9.0 10/30/2017   GFRAA >60 10/30/2017    Speciality Comments: No specialty comments available.  Procedures:  No procedures performed Allergies: Oxycodone, Reglan [metoclopramide], Tramadol hcl, Codeine, Cymbalta [duloxetine hcl], Eggs or egg-derived products, Gabapentin, Hydrocodone-acetaminophen, Penicillins, Savella [milnacipran hcl], Statins, Voltaren [diclofenac sodium], Zanaflex [tizanidine hcl], and Lyrica [pregabalin]   Assessment / Plan:     Visit Diagnoses: No diagnosis found.  Orders: No orders of the defined types were placed in this encounter.  No orders of the defined types were placed in this encounter.   Face-to-face time spent with patient was *** minutes. Greater than 50% of time was spent in counseling and coordination of care.  Follow-Up  Instructions: No follow-ups on file.   Earnestine Mealing, CMA  Note - This record has been created using Editor, commissioning.  Chart creation errors have been sought, but may not always  have been located. Such creation errors do not reflect on  the standard of medical care.

## 2020-08-22 ENCOUNTER — Ambulatory Visit: Payer: Medicare Other | Admitting: Physician Assistant

## 2020-08-22 DIAGNOSIS — M7072 Other bursitis of hip, left hip: Secondary | ICD-10-CM

## 2020-08-22 DIAGNOSIS — Z87442 Personal history of urinary calculi: Secondary | ICD-10-CM

## 2020-08-22 DIAGNOSIS — Z8639 Personal history of other endocrine, nutritional and metabolic disease: Secondary | ICD-10-CM

## 2020-08-22 DIAGNOSIS — M62838 Other muscle spasm: Secondary | ICD-10-CM

## 2020-08-22 DIAGNOSIS — Z8669 Personal history of other diseases of the nervous system and sense organs: Secondary | ICD-10-CM

## 2020-08-22 DIAGNOSIS — M19041 Primary osteoarthritis, right hand: Secondary | ICD-10-CM

## 2020-08-22 DIAGNOSIS — M7071 Other bursitis of hip, right hip: Secondary | ICD-10-CM

## 2020-08-22 DIAGNOSIS — R5383 Other fatigue: Secondary | ICD-10-CM

## 2020-08-22 DIAGNOSIS — Z8659 Personal history of other mental and behavioral disorders: Secondary | ICD-10-CM

## 2020-08-22 DIAGNOSIS — M3500 Sicca syndrome, unspecified: Secondary | ICD-10-CM

## 2020-08-22 DIAGNOSIS — M810 Age-related osteoporosis without current pathological fracture: Secondary | ICD-10-CM

## 2020-08-22 DIAGNOSIS — Z862 Personal history of diseases of the blood and blood-forming organs and certain disorders involving the immune mechanism: Secondary | ICD-10-CM

## 2020-08-22 DIAGNOSIS — F5101 Primary insomnia: Secondary | ICD-10-CM

## 2020-08-22 DIAGNOSIS — M797 Fibromyalgia: Secondary | ICD-10-CM

## 2020-08-22 DIAGNOSIS — M5030 Other cervical disc degeneration, unspecified cervical region: Secondary | ICD-10-CM

## 2020-08-22 DIAGNOSIS — Z9049 Acquired absence of other specified parts of digestive tract: Secondary | ICD-10-CM

## 2020-08-22 NOTE — Progress Notes (Signed)
Office Visit Note  Patient: Danielle Byrd             Date of Birth: 1955/04/30           MRN: [id number]             PCP: Allie Dimmer, MD Referring: Allie Dimmer, MD Visit Date: 08/25/2020 Occupation: '@GUAROCC' @  Subjective:  Neck pain   History of Present Illness: ADRIA COSTLEY is a 65 y.o. female with history of fibromyalgia and osteoarthritis.  Patient presents today with increased neck pain and stiffness.  She has ongoing trapezius muscle tension and muscle tenderness.  She had trigger point injections performed on 05/04/2020 but did not notice any improvement in her symptoms.  She continues to take methocarbamol 500 mg 3 times daily for muscle spasms which helps alleviate some of her discomfort.  She states that she tried going for deep tissue massage but did not notice any improvement in her symptoms.  She states that using her TENS unit has made her pain worse she has noticed some numbness in her right hand intermittently.  She states that on 08/14/2020 she was lying in bed on her left side and felt a sharp shooting pain in her right wrist up to her elbow.  She states that the pain brought her to tears due to the severity.  She has not noticed any joint swelling in her right wrist.  She has been wearing a compression brace which has provided some relief.  She has tried using heat, Salonpas patches, lidocaine patches, and Voltaren gel with minimal improvement in her symptoms. She continues to have ongoing pain due to ischial bursitis of both hips.     Activities of Daily Living:  Patient reports morning stiffness for several hours.   Patient Reports nocturnal pain.  Difficulty dressing/grooming: Reports Difficulty climbing stairs: Reports Difficulty getting out of chair: Reports Difficulty using hands for taps, buttons, cutlery, and/or writing: Reports  Review of Systems  Constitutional: Positive for fatigue.  HENT: Positive for mouth dryness and nose dryness. Negative  for mouth sores.   Eyes: Positive for itching and dryness. Negative for pain and visual disturbance.  Respiratory: Negative for cough, hemoptysis, shortness of breath and difficulty breathing.   Cardiovascular: Positive for swelling in legs/feet. Negative for chest pain, palpitations and hypertension.  Gastrointestinal: Positive for constipation. Negative for abdominal pain, blood in stool and diarrhea.  Endocrine: Negative for increased urination.  Genitourinary: Negative for painful urination.  Musculoskeletal: Positive for arthralgias, joint pain, myalgias, muscle weakness, morning stiffness, muscle tenderness and myalgias. Negative for joint swelling.  Skin: Negative for color change, pallor, rash, hair loss, nodules/bumps, redness, skin tightness, ulcers and sensitivity to sunlight.  Allergic/Immunologic: Negative for susceptible to infections.  Neurological: Positive for numbness, headaches and weakness. Negative for dizziness.  Hematological: Negative for swollen glands.  Psychiatric/Behavioral: Positive for sleep disturbance. Negative for depressed mood. The patient is not nervous/anxious.     PMFS History:  Patient Active Problem List   Diagnosis Date Noted  . Left thigh pain 05/05/2019  . 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 Hx of colonic polyps 09/05/2017  . Family hx of colon cancer 09/05/2017  . Migraine without aura and without status migrainosus, not intractable 03/04/2017  . Fibromyalgia 11/22/2016  . Other fatigue 11/22/2016  . DDD (degenerative disc disease), cervical 11/22/2016  . Primary osteoarthritis of both hands 11/22/2016  . History of migraine 11/22/2016  . History of anxiety 11/22/2016  . History of  hypothyroidism 11/22/2016  . History of kidney stones 11/22/2016  . History of cholecystectomy 11/22/2016  . History of anemia 11/22/2016  . Hx of bone density study/ normal 2014 this is followed by her Primary Care  11/22/2016  . Sicca syndrome ([place])  11/22/2016  . 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 Headache(784.0) 10/27/2013  . Leukopenia 07/08/2013  . Anemia 10/31/2011  . Iron deficiency 10/31/2011  . GERD (gastroesophageal reflux disease) 05/14/2011  . Abdominal pain, bilateral upper quadrant 05/14/2011  . H/O fibromyalgia 05/14/2011  . Constipation 05/14/2011  . Hyperlipemia 05/14/2011  . Kidney stones 01/11/2011    Past Medical History:  Diagnosis Date  . Anemia   . Anxiety   . Arthritis   . Cervical stenosis (uterine cervix)   . Closed fracture of left foot   . Constipation   . Copper deficiency   . Depression   . Fibromyalgia   . GERD (gastroesophageal reflux disease)   . High cholesterol   . History of kidney stones   . Hyperlipidemia   . Hypothyroidism   . Kidney stones   . Lumbar stenosis 11/27/2016  . Melanosis coli   . Migraines   . Nephrolithiasis   . Osteopenia 12/2019   Per patient, diagnosed by PCP Allie Dimmer, MD  . Osteoporosis 12/2019   Per patient, diagnosed by PCP Allie Dimmer, MD  . Panic attack   . Rosacea   . Sliding hiatal hernia   . Spondylosis, cervical     Family History  Problem Relation Age of Onset  . Diabetes Mother   . Hypertension Mother   . Anemia Mother   . Colon cancer Father   . Cancer Father 49       colon cancer  . Heart attack Father    Past Surgical History:  Procedure Laterality Date  . ABDOMINAL HYSTERECTOMY    . BONE MARROW BIOPSY     & aspirate  . BREAST SURGERY Right    partial mastectomy  . CHOLECYSTECTOMY    . COLONOSCOPY N/A 07/22/2012   Procedure: COLONOSCOPY;  Surgeon: [name], MD;  Location: [facility];  Service: Endoscopy;  Laterality: N/A;  730  . COLONOSCOPY WITH PROPOFOL N/A 10/25/2017   Procedure: COLONOSCOPY WITH PROPOFOL;  Surgeon: [name], MD;  Location: [facility];  Service: Endoscopy;  Laterality: N/A;  10:30  . DILATION AND CURETTAGE OF UTERUS    . EYE SURGERY     sty removal and reconstruction of eye lids.   Marland Kitchen NISSEN FUNDOPLICATION    . POLYPECTOMY  10/25/2017   Procedure: POLYPECTOMY;  Surgeon: [name], MD;  Location: [facility];  Service: Endoscopy;;  sigmoid    Social History   Social History Narrative   Patient lives at home with chacha (boxer, therapy dog)   Patient right handed   Patient has a BS degree.   Patient drinks 2 cups of tea daily.    There is no immunization history on file for this patient.   Objective: Vital Signs: BP 120/79 (BP Location: Left Arm, Patient Position: Sitting, Cuff Size: Normal)   Pulse 75   Ht 5' 5.5" (1.664 m)   Wt 119 lb 3.2 oz (54.1 kg)   BMI 19.53 kg/m    Physical Exam Vitals and nursing note reviewed.  Constitutional:      Appearance: She is well-developed.  HENT:     Head: Normocephalic and atraumatic.  Eyes:     Conjunctiva/sclera: Conjunctivae normal.  Pulmonary:  Effort: Pulmonary effort is normal.  Abdominal:     Palpations: Abdomen is soft.  Musculoskeletal:     Cervical back: Normal range of motion.  Skin:    General: Skin is warm and dry.     Capillary Refill: Capillary refill takes less than 2 seconds.  Neurological:     Mental Status: She is alert and oriented to person, place, and time.  Psychiatric:        Behavior: Behavior normal.      Musculoskeletal Exam: Generalized hyperalgesia and positive tender points on examination.  She has limited range of motion of the C-spine on exam.  Trapezius muscle tension and muscle tenderness bilaterally.  She has tenderness over the deltoid insertion site bilaterally.  Shoulder joints have good range of motion with discomfort bilaterally.  Elbow joints have good range of motion with no joint tenderness or inflammation.  She has tenderness along the ulnar aspect of the right wrist but no synovitis was noted.  She has some tenderness over the right third MCP but no synovitis was noted.  She was able to make a complete fist bilaterally.  Hip joints have good range of  motion with no discomfort.  Tenderness over the ischial bursa bilaterally.  Knee joints have good range of motion with no warmth or effusion.  Ankle joints have good range of motion with no joint tenderness or swelling.  CDAI Exam: CDAI Score: -- Patient Global: --; Provider Global: -- Swollen: --; Tender: -- Joint Exam 08/25/2020   No joint exam has been documented for this visit   There is currently no information documented on the homunculus. Go to the Rheumatology activity and complete the homunculus joint exam.  Investigation: No additional findings.  Imaging: XR Wrist Complete Right  Result Date: 08/25/2020 [facility] narrowing was noted.  No intercarpal or radiocarpal joint space narrowing was noted.  No erosive changes were noted. Impression: CMC arthritis was noted.  XR Cervical Spine 2 or 3 views  Result Date: 08/25/2020 Multilevel spondylosis with most significant narrowing of C5-C6.  Facet joint arthropathy was noted. Impression: These findings are consistent with the multilevel spondylosis and facet joint arthropathy.   Recent Labs: Lab Results  Component Value Date   WBC 3.3 (L) 10/30/2017   HGB 11.0 (L) 10/30/2017   PLT 169 10/30/2017   NA 139 10/30/2017   K 4.0 10/30/2017   CL 103 10/30/2017   CO2 30 10/30/2017   GLUCOSE 111 (H) 10/30/2017   BUN 8 10/30/2017   CREATININE 0.73 10/30/2017   BILITOT 0.4 10/30/2017   ALKPHOS 72 10/30/2017   AST 23 10/30/2017   ALT 20 10/30/2017   PROT 6.4 (L) 10/30/2017   ALBUMIN 3.9 10/30/2017   CALCIUM 9.0 10/30/2017   GFRAA >60 10/30/2017    Speciality Comments: No specialty comments available.  Procedures:  No procedures performed Allergies: Oxycodone, Reglan [metoclopramide], Tramadol hcl, Codeine, Cymbalta [duloxetine hcl], Eggs or egg-derived products, Gabapentin, Hydrocodone-acetaminophen, Penicillins, Savella [milnacipran hcl], Statins, Voltaren [diclofenac sodium], Zanaflex [tizanidine hcl], and Lyrica [pregabalin]    Assessment / Plan:     Visit Diagnoses: Fibromyalgia: She has generalized hyperalgesia and positive tender points on examination today.  She has been experiencing increased myofascial pain in bilateral upper extremities and her neck over the past several weeks.  She has been taking methocarbamol 500 mg 3 times daily which has been alleviating some of her discomfort.  According to the patient her TENS unit has been making the pain worse so she has not  been using it as frequently as usual  She tried getting a deep tissue massage but did not notice any improvement in her symptoms.  She underwent trapezius trigger point injections on 08/03/20 which were not as effective as they have been in the past.  X-rays of the C-spine were updated today which were consistent with multilevel spondylosis and facet joint arthropathy. A referral to Dr. Louanne Skye was placed today. She declined physical therapy at this time. She will return for an office visit in August 2022.     Other fatigue: Chronic, unchanged.  Discussed the importance of regular exercise.  Primary insomnia - She takes trazodone 50 mg 1 tablet at bedtime as needed for insomnia.   Trapezius muscle spasm -She has ongoing trapezius muscle tension and tenderness bilaterally.  She had trigger point injections on 08/03/20 which did not provide any pain relief.  She has been taking methocarbamol 500 mg 3 times daily as needed for muscle spasms. She has also tried deep tissue massage, heat, TENS unit, salonpas, lidocaine patches, and voltaren gel with minimal improvement in her symptoms.  X-rays of the C-spine today revealed multilevel spondylosis and facet joint arthropathy.  She declined PT at this time.  A referral to Dr. Louanne Skye was placed today.  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   Ischial bursitis, right: Chronic pain.   Sicca syndrome ([place]): Unchanged.   Primary osteoarthritis of both hands: She has mild osteoarthritic changes in the both hands.   No tenderness over PIP or DIP joints noted.  No synovitis evident. Discussed the importance of joint protection and muscle strengthening.   Pain in right wrist - She has tenderness along the ulnar aspect of the right wrist joint.  Her discomfort started about 6 weeks ago with no injury prior to the onset of symptoms.  She has not noticed any joint swelling.  She does have difficulty lifting heavy objects especially first thing in the morning.  She has been using a wrist compression brace which has provided some relief.  She has also tried using salonpas patches and voltaren gel for pain relief.  X-rays of the right wrist were obtained today which revealed some CMC joint arthritis but were otherwise unremarkable. It is unclear if her discomfort is due to radiating pain from her neck.  She had no synovitis on examination today.  She can continue to use voltaren gel topically as needed for pain relief.  Plan: XR Wrist Complete Right  DDD (degenerative disc disease), cervical: Referral to Dr. Louanne Skye placed today. She declined a referral to PT at this time.   Neck pain -She presents today with ongoing neck pain and stiffness.  According to the patient on 08/14/2020 she was lying in bed on her left side and started to have a sharp shooting pain in her right arm which was so severe that it brought her to tears.  She has been experiencing these intermittent sharp shooting pains over the past several days.  She had trigger point injections performed on 08/03/2020 which did not provide any pain relief.  She tried going for a deep tissue massage which did not alleviate her discomfort.  Her TENS unit has been exacerbating her pain.  She has tried using heat, Salonpas patches, lidocaine patches, and Voltaren gel with minimal relief.  She has not had any weakness in her right upper extremity.  She has generalized muscle tenderness in bilateral upper extremities.  On examination today she has limited range of motion with lateral  rotation and painful flexion and extension of the C-spine.  Trapezius muscle tension and muscle tenderness noted bilaterally.  She also has tenderness over the deltoid insertion site bilaterally.  X-rays of the C-spine were obtained today which were consistent with multilevel spondylosis and facet joint arthropathy.  X-ray results were reviewed with the patient today in the office.  She declined physical therapy.  A referral to Dr. Louanne Skye was placed today for further evaluation and management.  Plan: XR Cervical Spine 2 or 3 views  Age-related osteoporosis without current pathological fracture: DEXA 12/14/19: [facility] spine BMD 0.780 with T-score -2.4.  She has been taking a calcium and vitamin D supplement.  No recent falls or fractures.   Other medical conditions are listed as follows:  History of anemia  History of cholecystectomy  History of migraine  History of anxiety  History of hypothyroidism  History of kidney stones    Orders: Orders Placed This Encounter  Procedures  . XR Cervical Spine 2 or 3 views  . XR Wrist Complete Right  . Ambulatory referral to Orthopedic Surgery   No orders of the defined types were placed in this encounter.    Follow-Up Instructions: Return in about 6 months (around 02/25/2021) for Fibromyalgia, Osteoarthritis.   Ofilia Neas, PA-C  Note - This record has been created using Dragon software.  Chart creation errors have been sought, but may not always  have been located. Such creation errors do not reflect on  the standard of medical care.

## 2020-08-25 ENCOUNTER — Encounter: Payer: Self-pay | Admitting: Physician Assistant

## 2020-08-25 ENCOUNTER — Ambulatory Visit: Payer: Self-pay

## 2020-08-25 ENCOUNTER — Ambulatory Visit (INDEPENDENT_AMBULATORY_CARE_PROVIDER_SITE_OTHER): Payer: Medicare Other | Admitting: Physician Assistant

## 2020-08-25 ENCOUNTER — Other Ambulatory Visit: Payer: Self-pay

## 2020-08-25 VITALS — BP 120/79 | HR 75 | Ht 65.5 in | Wt 119.2 lb

## 2020-08-25 DIAGNOSIS — Z862 Personal history of diseases of the blood and blood-forming organs and certain disorders involving the immune mechanism: Secondary | ICD-10-CM

## 2020-08-25 DIAGNOSIS — Z87442 Personal history of urinary calculi: Secondary | ICD-10-CM

## 2020-08-25 DIAGNOSIS — M542 Cervicalgia: Secondary | ICD-10-CM

## 2020-08-25 DIAGNOSIS — M810 Age-related osteoporosis without current pathological fracture: Secondary | ICD-10-CM

## 2020-08-25 DIAGNOSIS — M797 Fibromyalgia: Secondary | ICD-10-CM | POA: Diagnosis not present

## 2020-08-25 DIAGNOSIS — Z8639 Personal history of other endocrine, nutritional and metabolic disease: Secondary | ICD-10-CM

## 2020-08-25 DIAGNOSIS — M7072 Other bursitis of hip, left hip: Secondary | ICD-10-CM

## 2020-08-25 DIAGNOSIS — F5101 Primary insomnia: Secondary | ICD-10-CM | POA: Diagnosis not present

## 2020-08-25 DIAGNOSIS — M19042 Primary osteoarthritis, left hand: Secondary | ICD-10-CM

## 2020-08-25 DIAGNOSIS — Z8659 Personal history of other mental and behavioral disorders: Secondary | ICD-10-CM | POA: Diagnosis not present

## 2020-08-25 DIAGNOSIS — R5383 Other fatigue: Secondary | ICD-10-CM

## 2020-08-25 DIAGNOSIS — Z9049 Acquired absence of other specified parts of digestive tract: Secondary | ICD-10-CM

## 2020-08-25 DIAGNOSIS — Z8669 Personal history of other diseases of the nervous system and sense organs: Secondary | ICD-10-CM

## 2020-08-25 DIAGNOSIS — M19041 Primary osteoarthritis, right hand: Secondary | ICD-10-CM

## 2020-08-25 DIAGNOSIS — M62838 Other muscle spasm: Secondary | ICD-10-CM

## 2020-08-25 DIAGNOSIS — M25531 Pain in right wrist: Secondary | ICD-10-CM

## 2020-08-25 DIAGNOSIS — M3500 Sicca syndrome, unspecified: Secondary | ICD-10-CM

## 2020-08-25 DIAGNOSIS — M7071 Other bursitis of hip, right hip: Secondary | ICD-10-CM

## 2020-08-25 DIAGNOSIS — M5030 Other cervical disc degeneration, unspecified cervical region: Secondary | ICD-10-CM

## 2020-08-25 MED ORDER — TRIAMCINOLONE ACETONIDE 40 MG/ML IJ SUSP
10.0000 mg | INTRAMUSCULAR | Status: AC | PRN
  Administered 2020-08-25: 10 mg via INTRAMUSCULAR

## 2020-08-25 MED ORDER — LIDOCAINE HCL 1 % IJ SOLN
0.5000 mL | INTRAMUSCULAR | Status: AC | PRN
  Administered 2020-08-25: .5 mL

## 2020-09-21 ENCOUNTER — Telehealth: Payer: Self-pay

## 2020-09-21 NOTE — Telephone Encounter (Signed)
Spoke with patient. She does not have my chart and unable to send a picture. Patient advised if she is unable to send a picture then we would need to schedule an appointment. Patient declined an appointment at this time due to living in [place]. Patient will e-mail pictures to [facility].

## 2020-09-21 NOTE — Telephone Encounter (Signed)
Patient left a voicemail stating Danielle Byrd wanted her to send pictures of her wrist if she developed swelling.  Patient states her cell phone isn't working well and requested a return call on her home # to let her know if she can email the pictures.

## 2020-09-23 ENCOUNTER — Telehealth: Payer: Self-pay

## 2020-09-23 NOTE — Telephone Encounter (Signed)
Patient called stating she sent an email to [facility] with pictures of her wrist.  Patient requested a return call to let her know if Lovena Le was able to view them.

## 2020-09-23 NOTE — Telephone Encounter (Signed)
Patient advised that unfortunately Danielle Byrd is out of the office today and I will follow up with her on Monday to see if she received the picture in her e-mail. Patient expressed understanding.

## 2020-10-06 ENCOUNTER — Telehealth: Payer: Self-pay | Admitting: *Deleted

## 2020-10-06 NOTE — Telephone Encounter (Signed)
Patient advised she can try icing, elevating, and using a brace or ace wrap for compression.  She can also use voltaren gel topically as needed up to 4 times daily for pain relief. Patient states she has been doing these things.

## 2020-10-06 NOTE — Telephone Encounter (Signed)
Received pictures of patient's hands via e-mail from patient. She has complaints of swelling in her hands. Per [name], PA-C it would be best for patient to come to office for evaluation by ultrasound. Patient advised and has scheduled for 10/17/2020 at 11:15 am. Patient would like to know what she can do in the mean time for the pain and discomfort. Please advise.

## 2020-10-06 NOTE — Telephone Encounter (Signed)
She can try icing, elevating, and using a brace or ace wrap for compression.  She can also use voltaren gel topically as needed up to 4 times daily for pain relief.

## 2020-10-13 ENCOUNTER — Ambulatory Visit (INDEPENDENT_AMBULATORY_CARE_PROVIDER_SITE_OTHER): Payer: Medicare Other | Admitting: Specialist

## 2020-10-13 ENCOUNTER — Encounter: Payer: Self-pay | Admitting: Specialist

## 2020-10-13 ENCOUNTER — Ambulatory Visit (INDEPENDENT_AMBULATORY_CARE_PROVIDER_SITE_OTHER): Payer: Medicare Other

## 2020-10-13 ENCOUNTER — Other Ambulatory Visit: Payer: Self-pay

## 2020-10-13 VITALS — BP 143/82 | HR 64 | Ht 65.5 in | Wt 119.0 lb

## 2020-10-13 DIAGNOSIS — M25831 Other specified joint disorders, right wrist: Secondary | ICD-10-CM

## 2020-10-13 DIAGNOSIS — M542 Cervicalgia: Secondary | ICD-10-CM

## 2020-10-13 DIAGNOSIS — R202 Paresthesia of skin: Secondary | ICD-10-CM

## 2020-10-13 DIAGNOSIS — G5603 Carpal tunnel syndrome, bilateral upper limbs: Secondary | ICD-10-CM

## 2020-10-13 DIAGNOSIS — M47812 Spondylosis without myelopathy or radiculopathy, cervical region: Secondary | ICD-10-CM | POA: Diagnosis not present

## 2020-10-13 DIAGNOSIS — R200 Anesthesia of skin: Secondary | ICD-10-CM

## 2020-10-13 MED ORDER — AMITRIPTYLINE HCL 10 MG PO TABS: 10.0000 mg | ORAL_TABLET | Freq: Every day | ORAL | 3 refills | Status: DC

## 2020-10-13 NOTE — Patient Instructions (Signed)
Plan: Avoid overhead lifting and overhead use of the arms. Do not lift greater than 5 lbs. Adjust head rest in vehicle to prevent hyperextension if rear ended,Take extra precautions to avoid falling  Handicap license is approved. Dr. Romona Curls secretary/Assistant will call to arrange for electrical studies (EMG/NCV) of the arms.      Carpal Tunnel Syndrome  Carpal tunnel syndrome is a condition that causes pain in your hand and arm. The carpal tunnel is a narrow area located on the palm side of your wrist. Repeated wrist motion or certain diseases may cause swelling within the tunnel. This swelling pinches the main nerve in the wrist (median nerve). What are the causes? This condition may be caused by: Repeated wrist motions. Wrist injuries. Arthritis. A cyst or tumor in the carpal tunnel. Fluid buildup during pregnancy. Sometimes the cause of this condition is not known. What increases the risk? This condition is more likely to develop in: People who have jobs that cause them to repeatedly move their wrists in the same motion, such as Art gallery manager. Women. People with certain conditions, such as: Diabetes. Obesity. An underactive thyroid (hypothyroidism). Kidney failure. What are the signs or symptoms? Symptoms of this condition include: A tingling feeling in your fingers, especially in your thumb, index, and middle fingers. Tingling or numbness in your hand. An aching feeling in your entire arm, especially when your wrist and elbow are bent for long periods of time. Wrist pain that goes up your arm to your shoulder. Pain that goes down into your palm or fingers. A weak feeling in your hands. You may have trouble grabbing and holding items. Your symptoms may feel worse during the night. How is this diagnosed? This condition is diagnosed with a medical history and physical exam. You may also have tests, including: An electromyogram (EMG). This test measures electrical  signals sent by your nerves into the muscles. X-rays. How is this treated? Treatment for this condition includes: Lifestyle changes. It is important to stop doing or modify the activity that caused your condition. Physical or occupational therapy. Medicines for pain and inflammation. This may include medicine that is injected into your wrist. A wrist splint. Surgery. Follow these instructions at home: If you have a splint:  Wear it as told by your health care provider. Remove it only as told by your health care provider. Loosen the splint if your fingers become numb and tingle, or if they turn cold and blue. Keep the splint clean and dry. General instructions  Take over-the-counter and prescription medicines only as told by your health care provider. Rest your wrist from any activity that may be causing your pain. If your condition is work related, talk to your employer about changes that can be made, such as getting a wrist pad to use while typing. If directed, apply ice to the painful area: Put ice in a plastic bag. Place a towel between your skin and the bag. Leave the ice on for 20 minutes, 2-3 times per day. Keep all follow-up visits as told by your health care provider. This is important. Do any exercises as told by your health care provider, physical therapist, or occupational therapist. Contact a health care provider if: You have new symptoms. Your pain is not controlled with medicines. Your symptoms get worse. This information is not intended to replace advice given to you by your health care provider. Make sure you discuss any questions you have with your health care provider. Document Released: 03/16/2000 Document  Revised: 07/28/2015 Document Reviewed: 11/28/2016 Elsevier Interactive Patient Education  2017 [employer].

## 2020-10-13 NOTE — Progress Notes (Signed)
Office Visit Note   Patient: Danielle Byrd           Date of Birth: February 23, 1956           MRN: [id number] Visit Date: 10/13/2020              Requested by: Ofilia Neas, PA-C [address] [place],  [place] [address] PCP: Allie Dimmer, MD   Assessment & Plan: Visit Diagnoses:  1. Neck pain   2. Cervicalgia   3. Spondylosis without myelopathy or radiculopathy, cervical region   4. Bilateral carpal tunnel syndrome   5. Ulnocarpal abutment syndrome, right     Plan: Avoid overhead lifting and overhead use of the arms. Do not lift greater than 5 lbs. Adjust head rest in vehicle to prevent hyperextension if rear ended.Take extra precautions to avoid falling Handicap license is approved. Dr. Romona Curls secretary/Assistant will call to arrange for electrical studies (EMG/NCV) of the arms.   Carpal Tunnel Syndrome  Carpal tunnel syndrome is a condition that causes pain in your hand and arm. The carpal tunnel is a narrow area located on the palm side of your wrist. Repeated wrist motion or certain diseases may cause swelling within the tunnel. This swelling pinches the main nerve in the wrist (median nerve). What are the causes? This condition may be caused by: Repeated wrist motions. Wrist injuries. Arthritis. A cyst or tumor in the carpal tunnel. Fluid buildup during pregnancy. Sometimes the cause of this condition is not known. What increases the risk? This condition is more likely to develop in: People who have jobs that cause them to repeatedly move their wrists in the same motion, such as Art gallery manager. Women. People with certain conditions, such as: Diabetes. Obesity. An underactive thyroid (hypothyroidism). Kidney failure. What are the signs or symptoms? Symptoms of this condition include: A tingling feeling in your fingers, especially in your thumb, index, and middle fingers. Tingling or numbness in your hand. An aching feeling in your entire  arm, especially when your wrist and elbow are bent for long periods of time. Wrist pain that goes up your arm to your shoulder. Pain that goes down into your palm or fingers. A weak feeling in your hands. You may have trouble grabbing and holding items. Your symptoms may feel worse during the night. How is this diagnosed? This condition is diagnosed with a medical history and physical exam. You may also have tests, including: An electromyogram (EMG). This test measures electrical signals sent by your nerves into the muscles. X-rays. How is this treated? Treatment for this condition includes: Lifestyle changes. It is important to stop doing or modify the activity that caused your condition. Physical or occupational therapy. Medicines for pain and inflammation. This may include medicine that is injected into your wrist. A wrist splint. Surgery. Follow these instructions at home: If you have a splint:  Wear it as told by your health care provider. Remove it only as told by your health care provider. Loosen the splint if your fingers become numb and tingle, or if they turn cold and blue. Keep the splint clean and dry. General instructions  Take over-the-counter and prescription medicines only as told by your health care provider. Rest your wrist from any activity that may be causing your pain. If your condition is work related, talk to your employer about changes that can be made, such as getting a wrist pad to use while typing. If directed, apply ice to the painful  area: Put ice in a plastic bag. Place a towel between your skin and the bag. Leave the ice on for 20 minutes, 2-3 times per day. Keep all follow-up visits as told by your health care provider. This is important. Do any exercises as told by your health care provider, physical therapist, or occupational therapist. Contact a health care provider if: You have new symptoms. Your pain is not controlled with medicines. Your symptoms  get worse. This information is not intended to replace advice given to you by your health care provider. Make sure you discuss any questions you have with your health care provider. Document Released: 03/16/2000 Document Revised: 07/28/2015 Document Reviewed: 11/28/2016 Elsevier Interactive Patient Education  2017 [place] Instructions: No follow-ups on file.   Orders:  Orders Placed This Encounter  Procedures   XR Cervical Spine 2 or 3 views   No orders of the defined types were placed in this encounter.     Procedures: No procedures performed   Clinical Data: No additional findings.   Subjective: Chief Complaint  Patient presents with   Neck - Pain    65 year old right handed female with history of neck stiffness in AM with grating with neck rotation and bending. She does stretching and ROM all day long. Sees Dr. Estanislado Pandy for fibromyalgia and SICCA syndrome. History of hysterectomy and last bone density was this year and shows osteopenia.Takes Vit D supplements. She has neck pain from the occipute to the shoulders with stiffness and pain into the back of the head that some times takes her breath away. Positive night pain and numbness into the right hand more than left with the left being intermittant and better tolerated compared with the right hand numbness and tingling. Awakens shaking the right hand and sometimes has to wait for the right hand to wake up and that is painful. Has difficulty gripping with the hands and she drops items many Dishes have hit the floor and she has dropped food on the floor. Driving more than 15 minutes. Some loss of handwriting readability. No difficulty with bladder but she does have constipation. 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   Review of Systems  Constitutional:  Positive for activity change and unexpected weight change (during pandemic due to difficulty obtaining foods she takes in).  Negative for appetite change, chills, diaphoresis and fatigue.  HENT:  Positive for congestion, rhinorrhea, sinus pressure and sneezing. Negative for dental problem, drooling, ear discharge, ear pain, facial swelling, hearing loss, mouth sores, nosebleeds, postnasal drip, sinus pain, sore throat, tinnitus, trouble swallowing and voice change.   Eyes:  Positive for itching and visual disturbance (dry eyes, uses drops and ointments).  Respiratory:  Negative for apnea, cough, choking, chest tightness, shortness of breath and stridor.   Cardiovascular:  Positive for leg swelling (ankles swell even at beginng of the [place]). Negative for chest pain and palpitations.  Gastrointestinal:  Positive for constipation. Negative for abdominal distention, abdominal pain, anal bleeding, blood in stool, diarrhea, nausea, rectal pain and vomiting.  Endocrine: Positive for cold intolerance and polydipsia. Negative for heat intolerance, polyphagia and polyuria.  Genitourinary:  Negative for difficulty urinating, dyspareunia, dysuria and enuresis.  Musculoskeletal:  Positive for back pain, gait problem, neck pain and neck stiffness. Negative for arthralgias, joint swelling and myalgias.  Skin: Negative.  Negative for color change, pallor, rash and wound.  Neurological:  Positive for dizziness, weakness, light-headedness, numbness and headaches.  Negative for tremors, seizures, syncope, facial asymmetry and speech difficulty.  Hematological:  Negative for adenopathy. Bruises/bleeds easily.  Psychiatric/Behavioral: Negative.  Negative for agitation, behavioral problems, confusion, decreased concentration, dysphoric mood, hallucinations, self-injury, sleep disturbance and suicidal ideas. The patient is not nervous/anxious and is not hyperactive.     Objective: Vital Signs: BP (!) 143/82   Pulse 64   Ht 5' 5.5" (1.664 m)   Wt 119 lb (54 kg)   BMI 19.50 kg/m   Physical Exam  Ortho Exam  Specialty Comments:  No  specialty comments available.  Imaging: No results found.   PMFS History: Patient Active Problem List   Diagnosis Date Noted   Left thigh pain 05/05/2019   Osteoarthritis of lumbar spine 05/05/2019   Hx of colonic polyps 09/05/2017   Family hx of colon cancer 09/05/2017   Migraine without aura and without status migrainosus, not intractable 03/04/2017   Fibromyalgia 11/22/2016   Other fatigue 11/22/2016   DDD (degenerative disc disease), cervical 11/22/2016   Primary osteoarthritis of both hands 11/22/2016   History of migraine 11/22/2016   History of anxiety 11/22/2016   History of hypothyroidism 11/22/2016   History of kidney stones 11/22/2016   History of cholecystectomy 11/22/2016   History of anemia 11/22/2016   Hx of bone density study/ normal 2014 this is followed by her Primary Care  11/22/2016   Sicca syndrome ([place]) 11/22/2016   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   Headache(784.0) 10/27/2013   Leukopenia 07/08/2013   Anemia 10/31/2011   Iron deficiency 10/31/2011   GERD (gastroesophageal reflux disease) 05/14/2011   Abdominal pain, bilateral upper quadrant 05/14/2011   H/O fibromyalgia 05/14/2011   Constipation 05/14/2011   Hyperlipemia 05/14/2011   Kidney stones 01/11/2011   Past Medical History:  Diagnosis Date   Anemia    Anxiety    Arthritis    Cervical stenosis (uterine cervix)    Closed fracture of left foot    Constipation    Copper deficiency    Depression    Fibromyalgia    GERD (gastroesophageal reflux disease)    High cholesterol    History of kidney stones    Hyperlipidemia    Hypothyroidism    Kidney stones    Lumbar stenosis 11/27/2016   Melanosis coli    Migraines    Nephrolithiasis    Osteopenia 12/2019   Per patient, diagnosed by PCP Allie Dimmer, MD   Osteoporosis 12/2019   Per patient, diagnosed by PCP Allie Dimmer, MD   Panic attack    Rosacea    Sliding hiatal hernia    Spondylosis, cervical     Family History   Problem Relation Age of Onset   Diabetes Mother    Hypertension Mother    Anemia Mother    Colon cancer Father    Cancer Father 42       colon cancer   Heart attack Father     Past Surgical History:  Procedure Laterality Date   ABDOMINAL HYSTERECTOMY     BONE MARROW BIOPSY     & aspirate   BREAST SURGERY Right    partial mastectomy   CHOLECYSTECTOMY     COLONOSCOPY N/A 07/22/2012   Procedure: COLONOSCOPY;  Surgeon: [name], MD;  [facility];  Service: Endoscopy;  Laterality: N/A;  730   COLONOSCOPY WITH PROPOFOL N/A 10/25/2017   Procedure: COLONOSCOPY WITH PROPOFOL;  Surgeon: [name], MD;  Location: [facility];  Service: Endoscopy;  Laterality: N/A;  10:30  DILATION AND CURETTAGE OF UTERUS     EYE SURGERY     sty removal and reconstruction of eye lids.   NISSEN FUNDOPLICATION     POLYPECTOMY  10/25/2017   Procedure: POLYPECTOMY;  Surgeon: [name], MD;  [facility];  Service: Endoscopy;;  sigmoid    Social History   Occupational History    Employer: [employer]    Comment: no longer  Tobacco Use   Smoking status: Never   Smokeless tobacco: Never  Vaping Use   Vaping Use: Never used  Substance and Sexual Activity   Alcohol use: No    Alcohol/week: 0.0 standard drinks   Drug use: No   Sexual activity: Never    Birth control/protection: Surgical

## 2020-10-17 ENCOUNTER — Ambulatory Visit: Payer: Self-pay

## 2020-10-17 ENCOUNTER — Ambulatory Visit (INDEPENDENT_AMBULATORY_CARE_PROVIDER_SITE_OTHER): Payer: Medicare Other | Admitting: Rheumatology

## 2020-10-17 ENCOUNTER — Other Ambulatory Visit: Payer: Self-pay

## 2020-10-17 DIAGNOSIS — M19041 Primary osteoarthritis, right hand: Secondary | ICD-10-CM | POA: Diagnosis not present

## 2020-10-17 DIAGNOSIS — M19042 Primary osteoarthritis, left hand: Secondary | ICD-10-CM | POA: Diagnosis not present

## 2020-10-17 DIAGNOSIS — M79642 Pain in left hand: Secondary | ICD-10-CM

## 2020-10-17 DIAGNOSIS — M79641 Pain in right hand: Secondary | ICD-10-CM

## 2020-10-17 NOTE — Progress Notes (Signed)
Ultrasound examination of bilateral hands  Indication: Pain in bilateral hands and bilateral wrist.  Rule out synovitis.  Ultrasound examination of the bilateral hands was performed per EULAR recommendations. Using a 15 MHz transducer, grayscale and power Doppler bilateral second, third, and fifth MCP joints and bilateral wrist joints both dorsal and volar aspects were evaluated to look for synovitis or tenosynovitis. The findings were there was  synovitis noted in bilateral wrist joints.  No synovitis was noted in the MCPs  on the ultrasound examination. Right median nerve was 0.07 cm squares which was within normal limits and left median nerve was 0.09 cm squares which was within normal limits.  Impression: Ultrasound examination showed synovitis in bilateral wrist joints.  No median nerve enlargement was noted.  She continues to have pain and discomfort in her bilateral wrist joints.  She had tenderness on palpation of bilateral wrist joints today.  I gave her a handout on hydroxychloroquine to review.  She has an appointment coming up on November 01, 2020.  We will discuss treatment at that visit.  Bo Merino, MD

## 2020-10-18 NOTE — Progress Notes (Deleted)
Office Visit Note  Patient: Danielle Byrd             Date of Birth: October 07, 1955           MRN: [id number]             PCP: Allie Dimmer, MD Referring: Allie Dimmer, MD Visit Date: 11/01/2020 Occupation: _0 @  Subjective:  No chief complaint on file.   History of Present Illness: Danielle Byrd is a 65 y.o. female ***   Activities of Daily Living:  Patient reports morning stiffness for *** {minute/hour:19697}.   Patient {ACTIONS;DENIES/REPORTS:21021675::"Denies"} nocturnal pain.  Difficulty dressing/grooming: {ACTIONS;DENIES/REPORTS:21021675::"Denies"} Difficulty climbing stairs: {ACTIONS;DENIES/REPORTS:21021675::"Denies"} Difficulty getting out of chair: {ACTIONS;DENIES/REPORTS:21021675::"Denies"} Difficulty using hands for taps, buttons, cutlery, and/or writing: {ACTIONS;DENIES/REPORTS:21021675::"Denies"}  No Rheumatology ROS completed.   PMFS History:  Patient Active Problem List   Diagnosis Date Noted   Left thigh pain 05/05/2019   Osteoarthritis of lumbar spine 05/05/2019   Hx of colonic polyps 09/05/2017   Family hx of colon cancer 09/05/2017   Migraine without aura and without status migrainosus, not intractable 03/04/2017   Fibromyalgia 11/22/2016   Other fatigue 11/22/2016   DDD (degenerative disc disease), cervical 11/22/2016   Primary osteoarthritis of both hands 11/22/2016   History of migraine 11/22/2016   History of anxiety 11/22/2016   History of hypothyroidism 11/22/2016   History of kidney stones 11/22/2016   History of cholecystectomy 11/22/2016   History of anemia 11/22/2016   Hx of bone density study/ normal 2014 this is followed by her Primary Care  11/22/2016   Sicca syndrome ([place]) 11/22/2016   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   Headache(784.0) 10/27/2013   Leukopenia 07/08/2013   Anemia 10/31/2011   Iron deficiency 10/31/2011   GERD (gastroesophageal reflux disease) 05/14/2011   Abdominal pain, bilateral upper quadrant  05/14/2011   H/O fibromyalgia 05/14/2011   Constipation 05/14/2011   Hyperlipemia 05/14/2011   Kidney stones 01/11/2011    Past Medical History:  Diagnosis Date   Anemia    Anxiety    Arthritis    Cervical stenosis (uterine cervix)    Closed fracture of left foot    Constipation    Copper deficiency    Depression    Fibromyalgia    GERD (gastroesophageal reflux disease)    High cholesterol    History of kidney stones    Hyperlipidemia    Hypothyroidism    Kidney stones    Lumbar stenosis 11/27/2016   Melanosis coli    Migraines    Nephrolithiasis    Osteopenia 12/2019   Per patient, diagnosed by PCP Allie Dimmer, MD   Osteoporosis 12/2019   Per patient, diagnosed by PCP Allie Dimmer, MD   Panic attack    Rosacea    Sliding hiatal hernia    Spondylosis, cervical     Family History  Problem Relation Age of Onset   Diabetes Mother    Hypertension Mother    Anemia Mother    Colon cancer Father    Cancer Father 82       colon cancer   Heart attack Father    Past Surgical History:  Procedure Laterality Date   ABDOMINAL HYSTERECTOMY     BONE MARROW BIOPSY     & aspirate   BREAST SURGERY Right    partial mastectomy   CHOLECYSTECTOMY     COLONOSCOPY N/A 07/22/2012   Procedure: COLONOSCOPY;  Surgeon: [name], MD;  [facility];  Service: Endoscopy;  Laterality: N/A;  730   COLONOSCOPY WITH PROPOFOL N/A 10/25/2017   Procedure: COLONOSCOPY WITH PROPOFOL;  Surgeon: [name], MD;  [facility];  Service: Endoscopy;  Laterality: N/A;  10:30   DILATION AND CURETTAGE OF UTERUS     EYE SURGERY     sty removal and reconstruction of eye lids.   NISSEN FUNDOPLICATION     POLYPECTOMY  10/25/2017   Procedure: POLYPECTOMY;  Surgeon: [name], MD;  [facility];  Service: Endoscopy;;  sigmoid    Social History   Social History Narrative   Patient lives at home with chacha (boxer, therapy dog)   Patient right handed    Patient has a BS degree.   Patient drinks 2 cups of tea daily.    There is no immunization history on file for this patient.   Objective: Vital Signs: There were no vitals taken for this visit.   Physical Exam   Musculoskeletal Exam: ***  CDAI Exam: CDAI Score: -- Patient Global: --; Provider Global: -- Swollen: --; Tender: -- Joint Exam 11/01/2020   No joint exam has been documented for this visit   There is currently no information documented on the homunculus. Go to the Rheumatology activity and complete the homunculus joint exam.  Investigation: No additional findings.  Imaging: [place] COMPLETE JOINT SPACE STRUCTURES UP BILAT  Result Date: 10/17/2020 Ultrasound examination of the bilateral hands was performed per EULAR recommendations. Using a 15 MHz transducer, grayscale and power Doppler bilateral second, third, and fifth MCP joints and bilateral wrist joints both dorsal and volar aspects were evaluated to look for synovitis or tenosynovitis. The findings were there was  synovitis noted in bilateral wrist joints.  No synovitis was noted in the MCPs  on the ultrasound examination. Right median nerve was 0.07 cm squares which was within normal limits and left median nerve was 0.09 cm squares which was within normal limits. Impression: Ultrasound examination showed synovitis in bilateral wrist joints.  No median nerve enlargement was noted.  XR Cervical Spine 2 or 3 views  Result Date: 10/13/2020 Lateral flexion and extension radiographs of the cervical spine with review of previous [facility] and lateral cervical spine radiographs. There id some minimal straightening if the normal cervical lordosis. The disc height is minimaly narrowed at C3-4, C5-6 and C6-7. There is posterior disc spur present at the C5-6 level but the space available for the Cord appears adequate throughout the cervical spine. With flexion and extension there is no significant abnormal motion at C1-2 anteriorly and a  minimal anterior listhesis present at C2-3 of 1-43m, stable in flexion and extension. The posterior facet joint show mild sclerosis and mild degenerative narrowing. AP radiographs previously demonstrate Uncovertebral hypertrophy bilateral C5-6 and C6-7.    Recent Labs: Lab Results  Component Value Date   WBC 3.3 (L) 10/30/2017   HGB 11.0 (L) 10/30/2017   PLT 169 10/30/2017   NA 139 10/30/2017   K 4.0 10/30/2017   CL 103 10/30/2017   CO2 30 10/30/2017   GLUCOSE 111 (H) 10/30/2017   BUN 8 10/30/2017   CREATININE 0.73 10/30/2017   BILITOT 0.4 10/30/2017   ALKPHOS 72 10/30/2017   AST 23 10/30/2017   ALT 20 10/30/2017   PROT 6.4 (L) 10/30/2017   ALBUMIN 3.9 10/30/2017   CALCIUM 9.0 10/30/2017   GFRAA >60 10/30/2017    Speciality Comments: No specialty comments available.  Procedures:  No procedures performed Allergies: Oxycodone, Reglan [metoclopramide], Tramadol hcl, Codeine, Cymbalta [duloxetine  hcl], Eggs or egg-derived products, Gabapentin, Hydrocodone-acetaminophen, Penicillins, Savella [milnacipran hcl], Statins, Voltaren [diclofenac sodium], Zanaflex [tizanidine hcl], and Lyrica [pregabalin]   Assessment / Plan:     Visit Diagnoses: No diagnosis found.  Orders: No orders of the defined types were placed in this encounter.  No orders of the defined types were placed in this encounter.   Face-to-face time spent with patient was *** minutes. Greater than 50% of time was spent in counseling and coordination of care.  Follow-Up Instructions: No follow-ups on file.   Earnestine Mealing, CMA  Note - This record has been created using Editor, commissioning.  Chart creation errors have been sought, but may not always  have been located. Such creation errors do not reflect on  the standard of medical care.

## 2020-10-24 ENCOUNTER — Other Ambulatory Visit: Payer: Self-pay | Admitting: Rheumatology

## 2020-10-24 MED ORDER — PREDNISONE 5 MG PO TABS: ORAL_TABLET | ORAL | 0 refills | Status: DC

## 2020-10-24 NOTE — Telephone Encounter (Signed)
Attempted to contact the patient and left message for patient to call the office.  

## 2020-10-24 NOTE — Telephone Encounter (Signed)
Patient states she is having pain and swelling in bilateral wrists. Patient states the right wrist is worse than the left. Patient states she has an appointment on 11/01/2020 with Lovena Le. Patient states she needs some relief before then. Patient states she has been icing it which helps. Patient states using it all makes it work. Patient states she has tried Voltaren Gel, lidocaine ointment and ice. Patient states she is getting little relief. Patient is requesting a prescription for prednisone. Please advise.

## 2020-10-24 NOTE — Progress Notes (Signed)
Office Visit Note  Patient: Danielle Byrd             Date of Birth: Sep 11, 1955           MRN: [id number]             PCP: Allie Dimmer, MD Referring: Allie Dimmer, MD Visit Date: 10/26/2020 Occupation: '@GUAROCC' @  Subjective:  Discussed starting on Plaquenil  History of Present Illness: Danielle Byrd is a 65 y.o. female with history of fibromyalgia, osteoarthritis, and osteoporosis.  Today to discuss different treatment options.  She continues to have persistent pain and inflammation in both wrist joints.  She had an ultrasound of both hands performed on 10/17/2020 which revealed synovitis in both wrists.  She was hesitant to start on Plaquenil at that time.  Her discomfort has persistently been worsening so prednisone taper was sent to the pharmacy yesterday.  Today was her second dose so she has not noticed much improvement in her symptoms yet.  She states that she is also been experiencing intermittent pain in the right knee joint.  Her discomfort is exacerbated by going up and down steps.  She continues to have persistent trapezius muscle tension and tenderness bilaterally.  She requested bilateral trigger point injections.   Activities of Daily Living:  Patient reports morning stiffness for all day. Patient Reports nocturnal pain.  Difficulty dressing/grooming: Denies Difficulty climbing stairs: Reports Difficulty getting out of chair: Reports Difficulty using hands for taps, buttons, cutlery, and/or writing: Reports  Review of Systems  Constitutional:  Positive for fatigue.  HENT:  Positive for mouth dryness and nose dryness. Negative for mouth sores.   Eyes:  Positive for pain, itching and dryness.  Respiratory:  Negative for shortness of breath and difficulty breathing.   Cardiovascular:  Negative for chest pain and palpitations.  Gastrointestinal:  Positive for constipation. Negative for blood in stool and diarrhea.  Endocrine: Negative for increased urination.   Genitourinary:  Negative for difficulty urinating.  Musculoskeletal:  Positive for joint pain, joint pain, joint swelling, myalgias, morning stiffness, muscle tenderness and myalgias.  Skin:  Negative for color change, rash and redness.  Allergic/Immunologic: Negative for susceptible to infections.  Neurological:  Positive for dizziness, numbness, headaches and weakness. Negative for memory loss.  Hematological:  Positive for bruising/bleeding tendency.  Psychiatric/Behavioral:  Negative for confusion.    PMFS History:  Patient Active Problem List   Diagnosis Date Noted   Left thigh pain 05/05/2019   Osteoarthritis of lumbar spine 05/05/2019   Hx of colonic polyps 09/05/2017   Family hx of colon cancer 09/05/2017   Migraine without aura and without status migrainosus, not intractable 03/04/2017   Fibromyalgia 11/22/2016   Other fatigue 11/22/2016   DDD (degenerative disc disease), cervical 11/22/2016   Primary osteoarthritis of both hands 11/22/2016   History of migraine 11/22/2016   History of anxiety 11/22/2016   History of hypothyroidism 11/22/2016   History of kidney stones 11/22/2016   History of cholecystectomy 11/22/2016   History of anemia 11/22/2016   Hx of bone density study/ normal 2014 this is followed by her Primary Care  11/22/2016   Sicca syndrome ([place]) 11/22/2016   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   Headache(784.0) 10/27/2013   Leukopenia 07/08/2013   Anemia 10/31/2011   Iron deficiency 10/31/2011   GERD (gastroesophageal reflux disease) 05/14/2011   Abdominal pain, bilateral upper quadrant 05/14/2011   H/O fibromyalgia 05/14/2011   Constipation 05/14/2011   Hyperlipemia 05/14/2011   Kidney stones 01/11/2011  Past Medical History:  Diagnosis Date   Anemia    Anxiety    Arthritis    Cervical stenosis (uterine cervix)    Closed fracture of left foot    Constipation    Copper deficiency    Depression    Fibromyalgia    GERD (gastroesophageal reflux  disease)    High cholesterol    History of kidney stones    Hyperlipidemia    Hypothyroidism    Kidney stones    Lumbar stenosis 11/27/2016   Melanosis coli    Migraines    Nephrolithiasis    Osteopenia 12/2019   Per patient, diagnosed by PCP Allie Dimmer, MD   Osteoporosis 12/2019   Per patient, diagnosed by PCP Allie Dimmer, MD   Panic attack    Rosacea    Sliding hiatal hernia    Spondylosis, cervical     Family History  Problem Relation Age of Onset   Diabetes Mother    Hypertension Mother    Anemia Mother    Colon cancer Father    Cancer Father 70       colon cancer   Heart attack Father    Past Surgical History:  Procedure Laterality Date   ABDOMINAL HYSTERECTOMY     BONE MARROW BIOPSY     & aspirate   BREAST SURGERY Right    partial mastectomy   CHOLECYSTECTOMY     COLONOSCOPY N/A 07/22/2012   Procedure: COLONOSCOPY;  Surgeon: [name], MD;  Location: [facility];  Service: Endoscopy;  Laterality: N/A;  730   COLONOSCOPY WITH PROPOFOL N/A 10/25/2017   Procedure: COLONOSCOPY WITH PROPOFOL;  Surgeon: [name], MD;  Location: [facility];  Service: Endoscopy;  Laterality: N/A;  10:30   DILATION AND CURETTAGE OF UTERUS     EYE SURGERY     sty removal and reconstruction of eye lids.   NISSEN FUNDOPLICATION     POLYPECTOMY  10/25/2017   Procedure: POLYPECTOMY;  Surgeon: [name], MD;  Location: [facility];  Service: Endoscopy;;  sigmoid    Social History   Social History Narrative   Patient lives at home with chacha (boxer, therapy dog)   Patient right handed   Patient has a BS degree.   Patient drinks 2 cups of tea daily.    There is no immunization history on file for this patient.   Objective: Vital Signs: BP (!) 148/77 (BP Location: Right Arm, Patient Position: Sitting, Cuff Size: Normal)   Pulse 82   Ht '5\' 4"'  (1.626 m)   Wt 119 lb 6.4 oz (54.2 kg)   BMI 20.49 kg/m    Physical Exam Vitals and nursing note  reviewed.  Constitutional:      Appearance: She is well-developed.  HENT:     Head: Normocephalic and atraumatic.  Eyes:     Conjunctiva/sclera: Conjunctivae normal.  Pulmonary:     Effort: Pulmonary effort is normal.  Abdominal:     Palpations: Abdomen is soft.  Musculoskeletal:     Cervical back: Normal range of motion.  Skin:    General: Skin is warm and dry.     Capillary Refill: Capillary refill takes less than 2 seconds.  Neurological:     Mental Status: She is alert and oriented to person, place, and time.  Psychiatric:        Behavior: Behavior normal.     Musculoskeletal Exam: Generalized hyperalgesia and positive tender points.  C-spine has limited range of  motion with lateral rotation.  Trapezius muscle tension and tenderness noted bilaterally.  Shoulder joints have painful range of motion of both shoulders.  Elbow joints have good range of motion with no tenderness or inflammation.  She has tenderness on the ulnar aspect of both wrist.  No tenderness or synovitis over MCP joints.  Complete fist formation bilaterally.  Hip joints have good range of motion with no discomfort.  Small effusion in the right knee noted.  Ankle joints have good range of motion with no tenderness or joint swelling.  CDAI Exam: CDAI Score: -- Patient Global: --; Provider Global: -- Swollen: --; Tender: -- Joint Exam 10/26/2020   No joint exam has been documented for this visit   There is currently no information documented on the homunculus. Go to the Rheumatology activity and complete the homunculus joint exam.  Investigation: No additional findings.  Imaging: [place] COMPLETE JOINT SPACE STRUCTURES UP BILAT  Result Date: 10/17/2020 Ultrasound examination of the bilateral hands was performed per EULAR recommendations. Using a 15 MHz transducer, grayscale and power Doppler bilateral second, third, and fifth MCP joints and bilateral wrist joints both dorsal and volar aspects were evaluated to  look for synovitis or tenosynovitis. The findings were there was  synovitis noted in bilateral wrist joints.  No synovitis was noted in the MCPs  on the ultrasound examination. Right median nerve was 0.07 cm squares which was within normal limits and left median nerve was 0.09 cm squares which was within normal limits. Impression: Ultrasound examination showed synovitis in bilateral wrist joints.  No median nerve enlargement was noted.  XR Cervical Spine 2 or 3 views  Result Date: 10/13/2020 Lateral flexion and extension radiographs of the cervical spine with review of previous [facility] and lateral cervical spine radiographs. There id some minimal straightening if the normal cervical lordosis. The disc height is minimaly narrowed at C3-4, C5-6 and C6-7. There is posterior disc spur present at the C5-6 level but the space available for the Cord appears adequate throughout the cervical spine. With flexion and extension there is no significant abnormal motion at C1-2 anteriorly and a minimal anterior listhesis present at C2-3 of 1-66m, stable in flexion and extension. The posterior facet joint show mild sclerosis and mild degenerative narrowing. [facility] radiographs previously demonstrate Uncovertebral hypertrophy bilateral C5-6 and C6-7.    Recent Labs: Lab Results  Component Value Date   WBC 3.3 (L) 10/30/2017   HGB 11.0 (L) 10/30/2017   PLT 169 10/30/2017   NA 139 10/30/2017   K 4.0 10/30/2017   CL 103 10/30/2017   CO2 30 10/30/2017   GLUCOSE 111 (H) 10/30/2017   BUN 8 10/30/2017   CREATININE 0.73 10/30/2017   BILITOT 0.4 10/30/2017   ALKPHOS 72 10/30/2017   AST 23 10/30/2017   ALT 20 10/30/2017   PROT 6.4 (L) 10/30/2017   ALBUMIN 3.9 10/30/2017   CALCIUM 9.0 10/30/2017   GFRAA >60 10/30/2017    Speciality Comments: No specialty comments available.  Procedures:  No procedures performed Allergies: Oxycodone, Reglan [metoclopramide], Tramadol hcl, Codeine, Cymbalta [duloxetine hcl], Eggs or  egg-derived products, Gabapentin, Hydrocodone-acetaminophen, Penicillins, Savella [milnacipran hcl], Statins, Voltaren [diclofenac sodium], Zanaflex [tizanidine hcl], and Lyrica [pregabalin]      Assessment / Plan:     Visit Diagnoses: Pain in both hands - She continues to have persistent pain and stiffness in both wrist joints in both hands.  She has an underlying history of osteoarthritis of both hands but over the past several months  has been experiencing severe pain in her right wrist. She had an ultrasound of both hands performed on 10/17/2020 which revealed synovitis of both wrists.  Dr. Estanislado Pandy discussed a trial of Plaquenil at that time but the patient declined.  She has been experiencing persistent pain and inflammation in both wrists so a prednisone taper starting at 20 mg tapering by 5 mg every 2 days was sent to the pharmacy on 10/25/2020.  On examination today she continues to have tenderness over both wrist especially the ulnar aspect of the right wrist.  She had no tenderness or synovitis over MCP joints.  Baseline x-rays of both hands were ordered today.  We will also obtain the following lab work for further evaluation.  Due to the active synovitis on ultrasound and persistent pain different treatment options were discussed today in detail.  Indications, contraindications, potential side effects of Plaquenil were discussed.  All questions were addressed and consent was obtained.  She would like to discuss starting on Plaquenil with her ophthalmologist prior to starting the prescription.  She will notify us with her decision.  Prescription will also be pending lab results obtained today.   plan: XR Hand 2 View Left, XR Hand 2 View Right  Plan: 14-3-3 eta Protein, Cyclic citrul peptide antibody, IgG, Rheumatoid factor, Sedimentation rate, ANA  Patient was counseled on the purpose, proper use, and adverse effects of hydroxychloroquine including nausea/diarrhea, skin rash, headaches, and sun  sensitivity.  Discussed importance of annual eye exams while on hydroxychloroquine to monitor to ocular toxicity and discussed importance of frequent laboratory monitoring.  Provided patient with eye exam form for baseline ophthalmologic exam.  Provided patient with educational materials on hydroxychloroquine and answered all questions.  Patient consented to hydroxychloroquine.  Will upload consent in the media tab.    Dose will be Plaquenil 200 mg once daily.  Prescription pending lab results.  High risk medication use -The plan is to start on Plaquenil pending lab results as well as approval of the patient.  She plans on reaching out to her ophthalmologist to get their opinion prior to starting on Plaquenil.  She is aware that she will require a baseline Plaquenil eye exam.  She was given a Plaquenil eye exam form to take with her to her next appointment.  The following lab work was ordered today and we will notify her of these results.  Plan: COMPLETE METABOLIC PANEL WITH GFR, CBC with Differential/Platelet, Glucose 6 phosphate dehydrogenase  Primary osteoarthritis of both hands - 10/17/2020: Ultrasound examination showed synovitis in bilateral wrist joints.  No median nerve enlargement was noted.  She continues to have persistent pain in both wrist joints.  She has been wearing arthritis wrist compression braces daily.  She continues to take tumeric and ginger on a daily basis.  She has tried applying Voltaren gel topically for pain relief.  A prednisone taper starting at 20 mg tapering by 5 mg every 2 days was sent to the pharmacy yesterday.  Fibromyalgia: Generalized hyperalgesia and positive tender points on exam.  She continues to have generalized myalgias and muscle tenderness due to underlying fibromyalgia.  She has trapezius muscle tension and tenderness bilaterally.  She takes methocarbamol 500 mg 3 times daily as needed for muscle spasms.  She requested trigger point injections today.  She  remains on magnesium and coenzyme Q10 daily.  Other fatigue: Chronic and secondary to insomnia.  Primary insomnia - She takes trazodone 50 mg 1 tablet at bedtime as needed for insomnia.  Discussed the importance of good sleep hygiene.  Trapezius muscle spasm - X-rays of the C-spine revealed multilevel spondylosis and facet joint arthropathy.  She continues to experience trapezius muscle tension and tenderness bilaterally.  She has been having muscle spasms intermittently.  She continues to use Lidoderm patches as needed for pain relief.  She requested trigger point injections today.  She tolerated the procedure well.  Procedure note completed above.  Aftercare was discussed.  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  She uses a cushion for support while seated.  Ischial bursitis, right: Chronic pain.  She uses a cushion for support while sitting.  Sicca syndrome ([place]): She has ongoing sicca symptoms which have been tolerable overall.  She continues to use refresh eyedrops  DDD (degenerative disc disease), cervical - She continues to have chronic neck pain and stiffness.  She established care with Dr. Louanne Skye on 10/13/2020.  She is awaiting her appointment for NCV with EMG with Dr. Ernestina Patches for further evaluation.  Other medical conditions are listed as follows:   Age-related osteoporosis without current pathological fracture - DEXA 12/14/19: AP spine BMD 0.780 with T-score -2.4.  She is taking vitamin D 10,000 units daily and a calcium supplement 1200 mg daily.Marland Kitchen  History of anxiety  History of migraine  History of anemia  History of kidney stones  History of hypothyroidism - Plan: HgB A1c  History of cholecystectomy  Polyarthralgia   Screening for diabetes mellitus (DM) - Patient requested to have Hgb A1c checked today. Plan: HgB A1c  Vision changes - Plan: HgB A1c  Family history of diabetes mellitus - Plan: HgB A1c  Pedal edema - Plan: HgB A1c  Orders: Orders Placed This  Encounter  Procedures   XR Hand 2 View Left   XR Hand 2 View Right   14-3-3 eta Protein   Cyclic citrul peptide antibody, IgG   Rheumatoid factor   Sedimentation rate   COMPLETE METABOLIC PANEL WITH GFR   CBC with Differential/Platelet   ANA   Glucose 6 phosphate dehydrogenase   HgB A1c    No orders of the defined types were placed in this encounter.     Follow-Up Instructions: Return in about 8 weeks (around 12/21/2020) for Fibromyalgia, Osteoarthritis.   Ofilia Neas, PA-C  Note - This record has been created using Dragon software.  Chart creation errors have been sought, but may not always  have been located. Such creation errors do not reflect on  the standard of medical care.

## 2020-10-24 NOTE — Telephone Encounter (Signed)
Ok to send in prednisone 20 mg tapering by 5 mg every 2 days. She should take prednisone in the morning with food and avoid taking NSAIDs while on prednisone.  Ok to schedule appointment tomorrow afternoon on my schedule if she would like to discuss other treatment options.

## 2020-10-24 NOTE — Telephone Encounter (Signed)
Prescription sent in for prednisone 20 mg tapering by 5 mg every 2 days. Patient advised she should take prednisone in the morning with food and avoid taking NSAIDs while on prednisone. Ok to schedule appointment tomorrow afternoon on my schedule if she would like to discuss other treatment options. Patient unable to come into the office tomorrow as she has to take her mother to the doctor. Patient scheduled for 10/26/2020 at 1:40 pm.

## 2020-10-24 NOTE — Telephone Encounter (Signed)
Patient calling in reference to pain, and inflammation in bilateral wrists. Patient states the only relief she gets is to have her wrists on ice. Patient requesting an rx for Prednisone dose pack to be sent into [facility] on [address]. Please call to advise.

## 2020-10-26 ENCOUNTER — Ambulatory Visit (INDEPENDENT_AMBULATORY_CARE_PROVIDER_SITE_OTHER): Payer: Medicare Other | Admitting: Physician Assistant

## 2020-10-26 ENCOUNTER — Ambulatory Visit: Payer: Self-pay

## 2020-10-26 ENCOUNTER — Telehealth: Payer: Self-pay

## 2020-10-26 ENCOUNTER — Other Ambulatory Visit: Payer: Self-pay

## 2020-10-26 ENCOUNTER — Encounter: Payer: Self-pay | Admitting: Physician Assistant

## 2020-10-26 VITALS — BP 148/77 | HR 82 | Ht 64.0 in | Wt 119.4 lb

## 2020-10-26 DIAGNOSIS — M810 Age-related osteoporosis without current pathological fracture: Secondary | ICD-10-CM

## 2020-10-26 DIAGNOSIS — M79641 Pain in right hand: Secondary | ICD-10-CM

## 2020-10-26 DIAGNOSIS — M5030 Other cervical disc degeneration, unspecified cervical region: Secondary | ICD-10-CM

## 2020-10-26 DIAGNOSIS — M19042 Primary osteoarthritis, left hand: Secondary | ICD-10-CM

## 2020-10-26 DIAGNOSIS — Z8659 Personal history of other mental and behavioral disorders: Secondary | ICD-10-CM

## 2020-10-26 DIAGNOSIS — Z131 Encounter for screening for diabetes mellitus: Secondary | ICD-10-CM

## 2020-10-26 DIAGNOSIS — Z9049 Acquired absence of other specified parts of digestive tract: Secondary | ICD-10-CM

## 2020-10-26 DIAGNOSIS — Z8639 Personal history of other endocrine, nutritional and metabolic disease: Secondary | ICD-10-CM

## 2020-10-26 DIAGNOSIS — R5383 Other fatigue: Secondary | ICD-10-CM

## 2020-10-26 DIAGNOSIS — M79642 Pain in left hand: Secondary | ICD-10-CM

## 2020-10-26 DIAGNOSIS — M2550 Pain in unspecified joint: Secondary | ICD-10-CM

## 2020-10-26 DIAGNOSIS — Z79899 Other long term (current) drug therapy: Secondary | ICD-10-CM | POA: Diagnosis not present

## 2020-10-26 DIAGNOSIS — M797 Fibromyalgia: Secondary | ICD-10-CM

## 2020-10-26 DIAGNOSIS — M19041 Primary osteoarthritis, right hand: Secondary | ICD-10-CM | POA: Diagnosis not present

## 2020-10-26 DIAGNOSIS — M3500 Sicca syndrome, unspecified: Secondary | ICD-10-CM

## 2020-10-26 DIAGNOSIS — R600 Localized edema: Secondary | ICD-10-CM

## 2020-10-26 DIAGNOSIS — Z8669 Personal history of other diseases of the nervous system and sense organs: Secondary | ICD-10-CM

## 2020-10-26 DIAGNOSIS — F5101 Primary insomnia: Secondary | ICD-10-CM

## 2020-10-26 DIAGNOSIS — Z862 Personal history of diseases of the blood and blood-forming organs and certain disorders involving the immune mechanism: Secondary | ICD-10-CM

## 2020-10-26 DIAGNOSIS — M62838 Other muscle spasm: Secondary | ICD-10-CM

## 2020-10-26 DIAGNOSIS — M542 Cervicalgia: Secondary | ICD-10-CM

## 2020-10-26 DIAGNOSIS — Z87442 Personal history of urinary calculi: Secondary | ICD-10-CM

## 2020-10-26 DIAGNOSIS — M25531 Pain in right wrist: Secondary | ICD-10-CM

## 2020-10-26 DIAGNOSIS — H539 Unspecified visual disturbance: Secondary | ICD-10-CM

## 2020-10-26 DIAGNOSIS — M7072 Other bursitis of hip, left hip: Secondary | ICD-10-CM

## 2020-10-26 DIAGNOSIS — M7071 Other bursitis of hip, right hip: Secondary | ICD-10-CM

## 2020-10-26 DIAGNOSIS — Z833 Family history of diabetes mellitus: Secondary | ICD-10-CM

## 2020-10-26 NOTE — Patient Instructions (Signed)
Standing Labs We placed an order today for your standing lab work.   Please have your standing labs drawn in 1 month, 3 months, then every 5 months   If possible, please have your labs drawn 2 weeks prior to your appointment so that the provider can discuss your results at your appointment.  Please note that you may see your imaging and lab results in [facility] before we have reviewed them. We may be awaiting multiple results to interpret others before contacting you. Please allow our office up to 72 hours to thoroughly review all of the results before contacting the office for clarification of your results.  We have open lab daily: Monday through Thursday from 1:30-4:30 PM and Friday from 1:30-4:00 PM at the office of Dr. Bo Merino, [facility].   Please be advised, all patients with office appointments requiring lab work will take precedent over walk-in lab work.  If possible, please come for your lab work on Monday and Friday afternoons, as you may experience shorter wait times. The office is located at [address], [place], [place], [place] [address] No appointment is necessary.   Labs are drawn by [facility]. Please bring your co-pay at the time of your lab draw.  You may receive a bill from [facility] for your lab work.  If you wish to have your labs drawn at another location, please call the office 24 hours in advance to send orders.  If you have any questions regarding directions or hours of operation,  please call [phone].   As a reminder, please drink plenty of water prior to coming for your lab work. Thanks!   Hydroxychloroquine tablets What is this medication? HYDROXYCHLOROQUINE (hye drox ee KLOR oh kwin) is used to treat rheumatoidarthritis and systemic lupus erythematosus. It is also used to treat malaria. This medicine may be used for other purposes; ask your health care provider orpharmacist if you have questions. COMMON BRAND NAME(S): Plaquenil,  Quineprox What should I tell my care team before I take this medication? They need to know if you have any of these conditions: diabetes eye disease, vision problems G6PD deficiency heart disease history of irregular heartbeat if you often drink alcohol kidney disease liver disease porphyria psoriasis an unusual or allergic reaction to chloroquine, hydroxychloroquine, other medicines, foods, dyes, or preservatives pregnant or trying to get pregnant breast-feeding How should I use this medication? Take this medicine by mouth with a glass of water. Take it as directed on the prescription label. Do not cut, crush or chew this medicine. Swallow the tablets whole. Take it with food. Do not take it more than directed. Take all of this medicine unless your health care provider tells you to stop it early.Keep taking it even if you think you are better. Take products with antacids in them at a different time of day than this medicine. Take this medicine 4 hours before or 4 hours after antacids. Talk toyour health care provider if you have questions. Talk to your pediatrician regarding the use of this medicine in children. Whilethis drug may be prescribed for selected conditions, precautions do apply. Overdosage: If you think you have taken too much of this medicine contact apoison control center or emergency room at once. NOTE: This medicine is only for you. Do not share this medicine with others. What if I miss a dose? If you miss a dose, take it as soon as you can. If it is almost time for yournext dose, take only that dose.  Do not take double or extra doses. What may interact with this medication? Do not take this medicine with any of the following medications: cisapride dronedarone pimozide thioridazine This medicine may also interact with the following medications: ampicillin antacids cimetidine cyclosporine digoxin kaolin medicines for diabetes, like insulin, glipizide,  glyburide medicines for seizures like carbamazepine, phenobarbital, phenytoin mefloquine methotrexate other medicines that prolong the QT interval (cause an abnormal heart rhythm) praziquantel This list may not describe all possible interactions. Give your health care provider a list of all the medicines, herbs, non-prescription drugs, or dietary supplements you use. Also tell them if you smoke, drink alcohol, or use illegaldrugs. Some items may interact with your medicine. What should I watch for while using this medication? Visit your health care provider for regular checks on your progress. Tell your health care provider if your symptoms do not start to get better or if they getworse. You may need blood work done while you are taking this medicine. If you take other medicines that can affect heart rhythm, you may need more testing. Talkto your health care provider if you have questions. Your vision may be tested before and during use of this medicine. Tell yourhealth care provider right away if you have any change in your eyesight. This medicine may cause serious skin reactions. They can happen weeks to months after starting the medicine. Contact your health care provider right away if you notice fevers or flu-like symptoms with a rash. The rash may be red or purple and then turn into blisters or peeling of the skin. Or, you might notice a red rash with swelling of the face, lips or lymph nodes in your neck or underyour arms. If you or your family notice any changes in your behavior, such as new or worsening depression, thoughts of harming yourself, anxiety, or other unusual or disturbing thoughts, or memory loss, call your health care provider rightaway. What side effects may I notice from receiving this medication? Side effects that you should report to your doctor or health care professionalas soon as possible: allergic reactions (skin rash, itching or hives; swelling of the face, lips, or  tongue) changes in vision decreased hearing, ringing in the ears heartbeat rhythm changes (trouble breathing; chest pain; dizziness; fast, irregular heartbeat; feeling faint or lightheaded, falls) liver injury (dark yellow or brown urine; general ill feeling or flu-like symptoms; loss of appetite, right upper belly pain; unusually weak or tired, yellowing of the eyes or skin) low blood sugar (feeling anxious; confusion; dizziness; increased hunger; unusually weak or tired; increased sweating; shakiness; cold, clammy skin; irritable; headache; blurred vision; fast heartbeat; loss of consciousness) low red blood cell counts (trouble breathing; feeling faint; lightheaded, falls; unusually weak or tired) muscle weakness pain, tingling, numbness in the hands or feet rash, fever, and swollen lymph nodes redness, blistering, peeling or loosening of the skin, including inside the mouth suicidal thoughts, mood changes uncontrollable head, mouth, neck, arm, or leg movements unusual bruising or bleeding Side effects that usually do not require medical attention (report to yourdoctor or health care professional if they continue or are bothersome): diarrhea hair loss irritable This list may not describe all possible side effects. Call your doctor for medical advice about side effects. You may report side effects to FDA at1-800-FDA-1088. Where should I keep my medication? Keep out of the reach of children and pets. Store at room temperature up to 30 degrees C (86 degrees F). Protect fromlight. Get rid of any unused medicine after the  expiration date. To get rid of medicines that are no longer needed or have expired: Take the medicine to a medicine take-back program. Check with your pharmacy or law enforcement to find a location. If you cannot return the medicine, check the label or package insert to see if the medicine should be thrown out in the garbage or flushed down the toilet. If you are not sure, ask  your health care provider. If it is safe to put it in the trash, empty the medicine out of the container. Mix the medicine with cat litter, dirt, coffee grounds, or other unwanted substance. Seal the mixture in a bag or container. Put it in the trash. NOTE: This sheet is a summary. It may not cover all possible information. If you have questions about this medicine, talk to your doctor, pharmacist, orhealth care provider.  2022 [employer]vier/Gold Standard (2019-09-07 15:07:49)

## 2020-10-26 NOTE — Telephone Encounter (Signed)
Patient will be starting plaquenil pending labs and pt reaching out to her eye doctor, per [name], PA-C.   Consent obtained and sent to the scan center. Thanks!

## 2020-10-26 NOTE — Progress Notes (Signed)
X-rays of both hands were consistent with osteoarthritis.  No erosive changes were noted.

## 2020-10-27 NOTE — Telephone Encounter (Signed)
Patient states she has an appointment with eye doctor on 10/31/2020.

## 2020-10-31 ENCOUNTER — Other Ambulatory Visit: Payer: Self-pay

## 2020-10-31 NOTE — Telephone Encounter (Signed)
Placed on Danielle Byrd's desk for review.

## 2020-10-31 NOTE — Telephone Encounter (Signed)
Patient left a voicemail stating she had her eye doctor appointment today and she had the OCT test which was normal.

## 2020-11-01 ENCOUNTER — Ambulatory Visit: Payer: Medicare Other | Admitting: Rheumatology

## 2020-11-01 DIAGNOSIS — M62838 Other muscle spasm: Secondary | ICD-10-CM

## 2020-11-01 DIAGNOSIS — Z87442 Personal history of urinary calculi: Secondary | ICD-10-CM

## 2020-11-01 DIAGNOSIS — Z8669 Personal history of other diseases of the nervous system and sense organs: Secondary | ICD-10-CM

## 2020-11-01 DIAGNOSIS — Z8639 Personal history of other endocrine, nutritional and metabolic disease: Secondary | ICD-10-CM

## 2020-11-01 DIAGNOSIS — M25531 Pain in right wrist: Secondary | ICD-10-CM

## 2020-11-01 DIAGNOSIS — M3500 Sicca syndrome, unspecified: Secondary | ICD-10-CM

## 2020-11-01 DIAGNOSIS — M7071 Other bursitis of hip, right hip: Secondary | ICD-10-CM

## 2020-11-01 DIAGNOSIS — M542 Cervicalgia: Secondary | ICD-10-CM

## 2020-11-01 DIAGNOSIS — Z8659 Personal history of other mental and behavioral disorders: Secondary | ICD-10-CM

## 2020-11-01 DIAGNOSIS — M810 Age-related osteoporosis without current pathological fracture: Secondary | ICD-10-CM

## 2020-11-01 DIAGNOSIS — Z862 Personal history of diseases of the blood and blood-forming organs and certain disorders involving the immune mechanism: Secondary | ICD-10-CM

## 2020-11-01 DIAGNOSIS — R5383 Other fatigue: Secondary | ICD-10-CM

## 2020-11-01 DIAGNOSIS — M5030 Other cervical disc degeneration, unspecified cervical region: Secondary | ICD-10-CM

## 2020-11-01 DIAGNOSIS — F5101 Primary insomnia: Secondary | ICD-10-CM

## 2020-11-01 DIAGNOSIS — M19041 Primary osteoarthritis, right hand: Secondary | ICD-10-CM

## 2020-11-01 DIAGNOSIS — M7072 Other bursitis of hip, left hip: Secondary | ICD-10-CM

## 2020-11-01 DIAGNOSIS — M797 Fibromyalgia: Secondary | ICD-10-CM

## 2020-11-01 DIAGNOSIS — Z9049 Acquired absence of other specified parts of digestive tract: Secondary | ICD-10-CM

## 2020-11-01 MED ORDER — HYDROXYCHLOROQUINE SULFATE 200 MG PO TABS: 200.0000 mg | ORAL_TABLET | Freq: Every day | ORAL | 0 refills | Status: DC

## 2020-11-01 NOTE — Telephone Encounter (Signed)
See phone note from 10/31/2020 for details

## 2020-11-01 NOTE — Telephone Encounter (Signed)
PLQ eye exam appt 10/31/2020 normal [facility] f/u 12 months Macular OCT - WNL   Per office note on 10/26/2020: Dose will be Plaquenil 200 mg once daily.

## 2020-11-03 ENCOUNTER — Telehealth: Payer: Self-pay

## 2020-11-03 NOTE — Telephone Encounter (Signed)
Patient advised her Hgb A1C was normal. Patient asked about PLQ. Patient states she was reading through the side effects. Patient wanted to be sure that if she starts to have any side effects she can stop the medication and the side effects will go away. Patient advised that if she has any side effects to stop the medication and to call us. Patient advise side effects should go away once stopping. Patient reminded to wear sunscreen.

## 2020-11-03 NOTE — Telephone Encounter (Signed)
Patient called requesting a return call regarding her labwork. Patient states she is concerned about being diabetic.    Patient states she also picked up her Plaquenil medication yesterday, but before she starts wants to discuss the side effects and if she develops symptoms will they go away when she stops the medication.

## 2020-11-04 ENCOUNTER — Other Ambulatory Visit: Payer: Self-pay | Admitting: Specialist

## 2020-11-04 LAB — COMPLETE METABOLIC PANEL WITH GFR
AG Ratio: 2 (calc) (ref 1.0–2.5)
ALT: 22 U/L (ref 6–29)
AST: 26 U/L (ref 10–35)
Albumin: 4.5 g/dL (ref 3.6–5.1)
Alkaline phosphatase (APISO): 92 U/L (ref 37–153)
BUN: 13 mg/dL (ref 7–25)
CO2: 28 mmol/L (ref 20–32)
Calcium: 9.4 mg/dL (ref 8.6–10.4)
Chloride: 99 mmol/L (ref 98–110)
Creat: 0.75 mg/dL (ref 0.50–1.05)
Globulin: 2.2 g/dL (calc) (ref 1.9–3.7)
Glucose, Bld: 140 mg/dL — ABNORMAL HIGH (ref 65–99)
Potassium: 4.2 mmol/L (ref 3.5–5.3)
Sodium: 136 mmol/L (ref 135–146)
Total Bilirubin: 0.4 mg/dL (ref 0.2–1.2)
Total Protein: 6.7 g/dL (ref 6.1–8.1)
eGFR: 88 mL/min/{1.73_m2} (ref >=60)

## 2020-11-04 LAB — CBC WITH DIFFERENTIAL/PLATELET
Absolute Monocytes: 102 {cells}/uL — ABNORMAL LOW (ref 200–950)
Basophils Absolute: 0 {cells}/uL (ref 0–200)
Basophils Relative: 0 %
Eosinophils Absolute: 0 {cells}/uL — ABNORMAL LOW (ref 15–500)
Eosinophils Relative: 0 %
HCT: 36.1 % (ref 35.0–45.0)
Hemoglobin: 12.1 g/dL (ref 11.7–15.5)
Lymphs Abs: 679 {cells}/uL — ABNORMAL LOW (ref 850–3900)
MCH: 34.5 pg — ABNORMAL HIGH (ref 27.0–33.0)
MCHC: 33.5 g/dL (ref 32.0–36.0)
MCV: 102.8 fL — ABNORMAL HIGH (ref 80.0–100.0)
MPV: 9.7 fL (ref 7.5–12.5)
Monocytes Relative: 2.9 %
Neutro Abs: 2720 {cells}/uL (ref 1500–7800)
Neutrophils Relative %: 77.7 %
Platelets: 204 Thousand/uL (ref 140–400)
RBC: 3.51 Million/uL — ABNORMAL LOW (ref 3.80–5.10)
RDW: 12.3 % (ref 11.0–15.0)
Total Lymphocyte: 19.4 %
WBC: 3.5 Thousand/uL — ABNORMAL LOW (ref 3.8–10.8)

## 2020-11-04 LAB — RHEUMATOID FACTOR: Rheumatoid fact SerPl-aCnc: <14 [IU]/mL

## 2020-11-04 LAB — GLUCOSE 6 PHOSPHATE DEHYDROGENASE: G-6PDH: 13.3 U/g Hgb (ref 7.0–20.5)

## 2020-11-04 LAB — HEMOGLOBIN A1C
Hgb A1c MFr Bld: 5.1 % of total Hgb (ref <5.7)
Mean Plasma Glucose: 100 mg/dL
eAG (mmol/L): 5.5 mmol/L

## 2020-11-04 LAB — SEDIMENTATION RATE: Sed Rate: 2 mm/h (ref 0–30)

## 2020-11-04 LAB — ANA: Anti Nuclear Antibody (ANA): NEGATIVE

## 2020-11-04 LAB — CYCLIC CITRUL PEPTIDE ANTIBODY, IGG: Cyclic Citrullin Peptide Ab: <16 U

## 2020-11-04 LAB — 14-3-3 ETA PROTEIN: 14-3-3 eta Protein: <0.2 ng/mL (ref <0.2)

## 2020-11-04 NOTE — Telephone Encounter (Signed)
Please advise 

## 2020-11-07 ENCOUNTER — Telehealth: Payer: Self-pay | Admitting: *Deleted

## 2020-11-07 DIAGNOSIS — Z79899 Other long term (current) drug therapy: Secondary | ICD-10-CM

## 2020-11-07 NOTE — Progress Notes (Signed)
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 ESR WNL.  ANA negative. G6PDH WNL.   Hgb A1C 5.1. Please forward results to the patients PCP as requested.  Glucose is elevated-140. Rest of CMP WNL.  WBC count and RBC count are slightly low.  She will be starting PLQ.  She will require updated CBC and CMP 1 month after starting PLQ then 3 months then every 5 months.

## 2020-11-07 NOTE — Telephone Encounter (Signed)
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 ESR WNL.  ANA negative. G6PDH WNL.   Hgb A1C 5.1. Please forward results to the patients PCP as requested.  Glucose is elevated-140. Rest of CMP WNL.  WBC count and RBC count are slightly low.  She will be starting PLQ.  She will require updated CBC and CMP 1 month after starting PLQ then 3 months then every 5 months.

## 2020-11-30 NOTE — Progress Notes (Signed)
Office Visit Note  Patient: Danielle Byrd             Date of Birth: Jun 03, 1955           MRN: [id number]             PCP: Allie Dimmer, MD Referring: Allie Dimmer, MD Visit Date: 12/14/2020 Occupation: _0 @  Subjective:  Patient monitoring  History of Present Illness: Danielle Byrd is a 65 y.o. female with history of seronegative rheumatoid arthritis.  She is currently taking plaquenil 200 mg 1 tablet daily which was started after her last office visit on 10/26/2020.  She is tolerating PLQ without any side effects.  She states that she has noticed about a 10% improvement in her joint pain and inflammation since starting on Plaquenil.  She continues to have discomfort in both wrist joints and notices intermittent swelling in her right wrist.  She has been wearing right wrist compression wraps on a daily basis but has been able to take breaks throughout the day which she could not do previously.  She states that she is scheduled for a nerve conduction study with Dr. Ernestina Patches next week.  She continues to have trapezius muscle tension and tenderness bilaterally.  She requested trigger point injections today.  She states that the trigger point injections typically provide relief for 1-1/2 to 2 months. She is still thinking about starting on Reclast versus Prolia which will be prescribed by her PCP for osteoporosis.  She has been taking a calcium and vitamin D supplement on a daily basis.    Activities of Daily Living:  Patient reports morning stiffness for 2 hours.   Patient Reports nocturnal pain.  Difficulty dressing/grooming: Reports Difficulty climbing stairs: Reports Difficulty getting out of chair: Denies Difficulty using hands for taps, buttons, cutlery, and/or writing: Reports  Review of Systems  Constitutional:  Positive for fatigue.  HENT:  Positive for mouth dryness and nose dryness. Negative for mouth sores.   Eyes:  Positive for pain, itching and dryness.   Respiratory:  Negative for shortness of breath and difficulty breathing.   Cardiovascular:  Negative for chest pain and palpitations.  Gastrointestinal:  Positive for constipation. Negative for blood in stool and diarrhea.  Endocrine: Negative for increased urination.  Genitourinary:  Negative for difficulty urinating.  Musculoskeletal:  Positive for joint pain, joint pain, joint swelling, myalgias, morning stiffness, muscle tenderness and myalgias.  Skin:  Negative for color change, rash and redness.  Allergic/Immunologic: Negative for susceptible to infections.  Neurological:  Positive for numbness, headaches and weakness. Negative for dizziness and memory loss.  Hematological:  Positive for bruising/bleeding tendency.  Psychiatric/Behavioral:  Negative for confusion.    PMFS History:  Patient Active Problem List   Diagnosis Date Noted   Left thigh pain 05/05/2019   Osteoarthritis of lumbar spine 05/05/2019   Hx of colonic polyps 09/05/2017   Family hx of colon cancer 09/05/2017   Migraine without aura and without status migrainosus, not intractable 03/04/2017   Fibromyalgia 11/22/2016   Other fatigue 11/22/2016   DDD (degenerative disc disease), cervical 11/22/2016   Primary osteoarthritis of both hands 11/22/2016   History of migraine 11/22/2016   History of anxiety 11/22/2016   History of hypothyroidism 11/22/2016   History of kidney stones 11/22/2016   History of cholecystectomy 11/22/2016   History of anemia 11/22/2016   Hx of bone density study/ normal 2014 this is followed by her Primary Care  11/22/2016   Sicca syndrome ([place]) 11/22/2016  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   Headache(784.0) 10/27/2013   Leukopenia 07/08/2013   Anemia 10/31/2011   Iron deficiency 10/31/2011   GERD (gastroesophageal reflux disease) 05/14/2011   Abdominal pain, bilateral upper quadrant 05/14/2011   H/O fibromyalgia 05/14/2011   Constipation 05/14/2011   Hyperlipemia 05/14/2011    Kidney stones 01/11/2011    Past Medical History:  Diagnosis Date   Anemia    Anxiety    Arthritis    Cervical stenosis (uterine cervix)    Closed fracture of left foot    Constipation    Copper deficiency    Depression    Fibromyalgia    GERD (gastroesophageal reflux disease)    High cholesterol    History of kidney stones    Hyperlipidemia    Hypothyroidism    Kidney stones    Lumbar stenosis 11/27/2016   Melanosis coli    Migraines    Nephrolithiasis    Osteopenia 12/2019   Per patient, diagnosed by PCP Allie Dimmer, MD   Osteoporosis 12/2019   Per patient, diagnosed by PCP Allie Dimmer, MD   Panic attack    Rosacea    Sliding hiatal hernia    Spondylosis, cervical     Family History  Problem Relation Age of Onset   Diabetes Mother    Hypertension Mother    Anemia Mother    Colon cancer Father    Cancer Father 23       colon cancer   Heart attack Father    Past Surgical History:  Procedure Laterality Date   ABDOMINAL HYSTERECTOMY     BONE MARROW BIOPSY     & aspirate   BREAST SURGERY Right    partial mastectomy   CHOLECYSTECTOMY     COLONOSCOPY N/A 07/22/2012   Procedure: COLONOSCOPY;  Surgeon: [name], MD;  Location: [facility];  Service: Endoscopy;  Laterality: N/A;  730   COLONOSCOPY WITH PROPOFOL N/A 10/25/2017   Procedure: COLONOSCOPY WITH PROPOFOL;  Surgeon: [name], MD;  Location: [facility];  Service: Endoscopy;  Laterality: N/A;  10:30   DILATION AND CURETTAGE OF UTERUS     EYE SURGERY     sty removal and reconstruction of eye lids.   NISSEN FUNDOPLICATION     POLYPECTOMY  10/25/2017   Procedure: POLYPECTOMY;  Surgeon: [name], MD;  Location: [facility];  Service: Endoscopy;;  sigmoid    Social History   Social History Narrative   Patient lives at home with chacha (boxer, therapy dog)   Patient right handed   Patient has a BS degree.   Patient drinks 2 cups of tea daily.    There is no immunization  history on file for this patient.   Objective: Vital Signs: BP (!) 168/93 (BP Location: Left Arm, Patient Position: Sitting, Cuff Size: Normal)   Pulse 82   Ht 5' 5.5" (1.664 m)   Wt 122 lb 4.8 oz (55.5 kg)   BMI 20.04 kg/m    Physical Exam Vitals and nursing note reviewed.  Constitutional:      Appearance: She is well-developed.  HENT:     Head: Normocephalic and atraumatic.  Eyes:     Conjunctiva/sclera: Conjunctivae normal.  Pulmonary:     Effort: Pulmonary effort is normal.  Abdominal:     Palpations: Abdomen is soft.  Musculoskeletal:     Cervical back: Normal range of motion.  Skin:    General: Skin is warm and dry.     Capillary  Refill: Capillary refill takes less than 2 seconds.  Neurological:     Mental Status: She is alert and oriented to person, place, and time.  Psychiatric:        Behavior: Behavior normal.     Musculoskeletal Exam: C-spine has limited range of motion with lateral rotation.  Trapezius muscle tension and tenderness bilaterally.  Shoulder joints have good range of motion with some discomfort in the left shoulder.  Elbow joints have good range of motion with no tenderness or inflammation.  She has tenderness on the volar aspect of the right wrist with mild flexor tenosynovitis.  Small nodule likely a ganglion cyst on the volar aspect of the right wrist noted.  No tenderness or synovitis over MCP joints.  Complete fist formation bilaterally.  Knee joints have good range of motion with crepitus bilaterally.  No warmth or effusion of knee joints noted.  Ankle joints have good range of motion with no tenderness or joint swelling.  CDAI Exam: CDAI Score: 4  Patient Global: 6 mm; Provider Global: 4 mm Swollen: 1 ; Tender: 2  Joint Exam 12/14/2020      Right  Left  Wrist  Swollen Tender   Tender     Investigation: No additional findings.  Imaging: No results found.  Recent Labs: Lab Results  Component Value Date   WBC 3.5 (L) 10/26/2020    HGB 12.1 10/26/2020   PLT 204 10/26/2020   NA 136 10/26/2020   K 4.2 10/26/2020   CL 99 10/26/2020   CO2 28 10/26/2020   GLUCOSE 140 (H) 10/26/2020   BUN 13 10/26/2020   CREATININE 0.75 10/26/2020   BILITOT 0.4 10/26/2020   ALKPHOS 72 10/30/2017   AST 26 10/26/2020   ALT 22 10/26/2020   PROT 6.7 10/26/2020   ALBUMIN 3.9 10/30/2017   CALCIUM 9.4 10/26/2020   GFRAA >60 10/30/2017    Speciality Comments: PLQ eye exam appt 10/31/2020 normal [facility] f/u 12 months Macular OCT - WNL @ annual eye exam  Procedures:  Trigger Point Inj  Date/Time: 12/14/2020 3:15 PM Performed by: Ofilia Neas, PA-C Authorized by: Ofilia Neas, PA-C   Consent Given by:  Patient Site marked: the procedure site was marked   Timeout: prior to procedure the correct patient, procedure, and site was verified   Indications:  Pain Total # of Trigger Points:  2 Location: neck   Needle Size:  27 G Approach:  Dorsal Medications #1:  0.5 mL lidocaine 1 %; 10 mg triamcinolone acetonide 40 MG/ML Medications #2:  0.5 mL lidocaine 1 %; 10 mg triamcinolone acetonide 40 MG/ML Patient tolerance:  Patient tolerated the procedure well with no immediate complications Allergies: Oxycodone, Reglan [metoclopramide], Tramadol hcl, Codeine, Cymbalta [duloxetine hcl], Eggs or egg-derived products, Gabapentin, Hydrocodone-acetaminophen, Penicillins, Savella [milnacipran hcl], Statins, Voltaren [diclofenac sodium], Zanaflex [tizanidine hcl], and Lyrica [pregabalin]   Assessment / Plan:     Visit Diagnoses: Seronegative rheumatoid arthritis ([place]) - RF-, anti-CCP-, 14-3-3 eta-, ANA-: She was started on Plaquenil 200 mg 1 tablet by mouth daily after her last office visit on 10/26/2020.  She has been tolerating Plaquenil without any side effects and has not missed any doses.  She has noticed about a 10% improvement in her joint pain and inflammation since starting on Plaquenil.  She continues to have persistent pain  in both wrist joints especially her right wrist.  She has mild flexor tenosynovitis of the right wrist on examination today.  She has been wearing  right wrist compression wraps on a daily basis but has been able to take breaks throughout the day which she was unable to do previously.  She has no other joint inflammation on examination today.  She will remain on Plaquenil as prescribed.  She will follow-up in the office in about 3 months and we will reassess the true efficacy of Plaquenil at that time.  High risk medication use - Plaquenil 200 mg 1 tablet by mouth once daily.  PLQ eye exam: 10/31/2020.  CBC and CMP were drawn on 10/26/2020.  She is due to update lab work today.  Orders for CBC and CMP were released.  Lab work will be forwarded to her primary care once completed.  Her next lab work will be due in 3 months then every 5 months to monitor for drug toxicity.- Plan: COMPLETE METABOLIC PANEL WITH GFR, CBC with Differential/Platelet  Primary osteoarthritis of both hands - 10/17/2020: Ultrasound examination showed synovitis in bilateral wrist joints.  No median nerve enlargement was noted.  She continues to persistent pain in both wrist joints.  She has mild flexor tenosynovitis of the right wrist on examination today.  She is scheduled for an NCV with EMG with Dr. next week.  Fibromyalgia: She has generalized hyperalgesia and positive tender points on examination today.  She presents today with trapezius muscle tension and tenderness bilaterally.  She requested trigger point injections today.  She tolerated the procedure well.  Procedure is completed above.  Aftercare was discussed.  Primary insomnia -She takes trazodone 50 mg 1 tablet at bedtime as needed for insomnia.    Other fatigue: Chronic and secondary to insomnia.  She has some increased fatigue since being diagnosed with COVID-19 at the end of August.  Trapezius muscle spasm - X-rays of the C-spine revealed multilevel spondylosis and facet  joint arthropathy.  She has ongoing trapezius muscle tension and tenderness bilaterally.  She experiences muscle spasms intermittently.  She takes methocarbamol 500 mg 3 times daily for muscle spasms.  She requested trigger point injections today.  She tolerated the procedure well.  Procedure note was completed above.  Aftercare was discussed.  Ischial bursitis of left side: Chronic pain  Ischial bursitis, right: Chronic pain  Sicca syndrome ([place]): Unchanged  DDD (degenerative disc disease), cervical - She established care with Dr. Louanne Skye on 10/13/2020.  She has limited range of motion of the C-spine on examination today.  Has been experiencing radiating pain down her left arm.  She is scheduled for an NCV with EMG next week with Dr. Ernestina Patches.  Age-related osteoporosis without current pathological fracture - DEXA 12/14/19: AP spine BMD 0.780 with T-score -2.4. Ordered by PCP.  She has been taking a calcium and vitamin D supplement on a daily basis.  She could not tolerate taking Fosamax in the past.  Her PCP plans on initiating Reclast or Prolia.  CBC, CMP, vitamin D level will be checked today. Discussed the importance of avoiding long term prednisone use and recurrent cortisone injections.   Vitamin D deficiency -She has been taking calcium and vitamin D supplement on a daily basis.  Her vitamin D level will be checked today.  Plan: VITAMIN D 25 Hydroxy (Vit-D Deficiency, Fractures)  Other medical conditions are listed as follows:   History of anemia  History of anxiety  History of migraine  History of cholecystectomy  History of kidney stones  History of hypothyroidism  Polyarthralgia  Vision changes  Pedal edema  Family history of diabetes mellitus  Orders: Orders Placed This Encounter  Procedures   Trigger Point Inj   COMPLETE METABOLIC PANEL WITH GFR   CBC with Differential/Platelet   VITAMIN D 25 Hydroxy (Vit-D Deficiency, Fractures)   No orders of the defined types  were placed in this encounter.     Follow-Up Instructions: Return in about 3 months (around 03/15/2021) for Rheumatoid arthritis, Osteoporosis.   Ofilia Neas, PA-C  Note - This record has been created using Dragon software.  Chart creation errors have been sought, but may not always  have been located. Such creation errors do not reflect on  the standard of medical care.

## 2020-12-02 ENCOUNTER — Other Ambulatory Visit: Payer: Self-pay | Admitting: *Deleted

## 2020-12-02 ENCOUNTER — Telehealth: Payer: Self-pay

## 2020-12-02 DIAGNOSIS — Z79899 Other long term (current) drug therapy: Secondary | ICD-10-CM

## 2020-12-02 NOTE — Telephone Encounter (Signed)
Lab Orders faxed

## 2020-12-02 NOTE — Telephone Encounter (Signed)
Patient left a voicemail requesting the labwork that is needed for her Plaquenil medication be sent to Dr. Cletis Media office.

## 2020-12-08 ENCOUNTER — Telehealth: Payer: Self-pay

## 2020-12-08 NOTE — Telephone Encounter (Signed)
Patient left a voicemail stating she has decided to have the Plaquenil labwork at her appointment with Lovena Le on 12/14/20.

## 2020-12-08 NOTE — Telephone Encounter (Signed)
Noted patient's appointment.  

## 2020-12-14 ENCOUNTER — Encounter: Payer: Self-pay | Admitting: Physician Assistant

## 2020-12-14 ENCOUNTER — Other Ambulatory Visit: Payer: Self-pay

## 2020-12-14 ENCOUNTER — Ambulatory Visit (INDEPENDENT_AMBULATORY_CARE_PROVIDER_SITE_OTHER): Payer: Medicare Other | Admitting: Physician Assistant

## 2020-12-14 VITALS — BP 168/93 | HR 82 | Ht 65.5 in | Wt 122.3 lb

## 2020-12-14 DIAGNOSIS — H539 Unspecified visual disturbance: Secondary | ICD-10-CM

## 2020-12-14 DIAGNOSIS — M79641 Pain in right hand: Secondary | ICD-10-CM

## 2020-12-14 DIAGNOSIS — M797 Fibromyalgia: Secondary | ICD-10-CM

## 2020-12-14 DIAGNOSIS — M7072 Other bursitis of hip, left hip: Secondary | ICD-10-CM

## 2020-12-14 DIAGNOSIS — R5383 Other fatigue: Secondary | ICD-10-CM

## 2020-12-14 DIAGNOSIS — Z8659 Personal history of other mental and behavioral disorders: Secondary | ICD-10-CM

## 2020-12-14 DIAGNOSIS — M0600 Rheumatoid arthritis without rheumatoid factor, unspecified site: Secondary | ICD-10-CM

## 2020-12-14 DIAGNOSIS — Z79899 Other long term (current) drug therapy: Secondary | ICD-10-CM | POA: Diagnosis not present

## 2020-12-14 DIAGNOSIS — Z862 Personal history of diseases of the blood and blood-forming organs and certain disorders involving the immune mechanism: Secondary | ICD-10-CM

## 2020-12-14 DIAGNOSIS — M7071 Other bursitis of hip, right hip: Secondary | ICD-10-CM

## 2020-12-14 DIAGNOSIS — F5101 Primary insomnia: Secondary | ICD-10-CM

## 2020-12-14 DIAGNOSIS — R600 Localized edema: Secondary | ICD-10-CM

## 2020-12-14 DIAGNOSIS — M62838 Other muscle spasm: Secondary | ICD-10-CM

## 2020-12-14 DIAGNOSIS — Z87442 Personal history of urinary calculi: Secondary | ICD-10-CM

## 2020-12-14 DIAGNOSIS — M19042 Primary osteoarthritis, left hand: Secondary | ICD-10-CM

## 2020-12-14 DIAGNOSIS — M810 Age-related osteoporosis without current pathological fracture: Secondary | ICD-10-CM

## 2020-12-14 DIAGNOSIS — M19041 Primary osteoarthritis, right hand: Secondary | ICD-10-CM | POA: Diagnosis not present

## 2020-12-14 DIAGNOSIS — M3500 Sicca syndrome, unspecified: Secondary | ICD-10-CM

## 2020-12-14 DIAGNOSIS — Z9049 Acquired absence of other specified parts of digestive tract: Secondary | ICD-10-CM

## 2020-12-14 DIAGNOSIS — M5030 Other cervical disc degeneration, unspecified cervical region: Secondary | ICD-10-CM

## 2020-12-14 DIAGNOSIS — Z833 Family history of diabetes mellitus: Secondary | ICD-10-CM

## 2020-12-14 DIAGNOSIS — Z8669 Personal history of other diseases of the nervous system and sense organs: Secondary | ICD-10-CM

## 2020-12-14 DIAGNOSIS — E559 Vitamin D deficiency, unspecified: Secondary | ICD-10-CM

## 2020-12-14 DIAGNOSIS — Z8639 Personal history of other endocrine, nutritional and metabolic disease: Secondary | ICD-10-CM

## 2020-12-14 DIAGNOSIS — M2550 Pain in unspecified joint: Secondary | ICD-10-CM

## 2020-12-14 MED ORDER — TRIAMCINOLONE ACETONIDE 40 MG/ML IJ SUSP
10.0000 mg | INTRAMUSCULAR | Status: AC | PRN
  Administered 2020-12-14: 10 mg via INTRAMUSCULAR

## 2020-12-14 MED ORDER — LIDOCAINE HCL 1 % IJ SOLN
0.5000 mL | INTRAMUSCULAR | Status: AC | PRN
  Administered 2020-12-14: .5 mL

## 2020-12-15 ENCOUNTER — Telehealth: Payer: Self-pay | Admitting: *Deleted

## 2020-12-15 DIAGNOSIS — Z79899 Other long term (current) drug therapy: Secondary | ICD-10-CM

## 2020-12-15 LAB — COMPLETE METABOLIC PANEL WITH GFR
AG Ratio: 2.6 (calc) — ABNORMAL HIGH (ref 1.0–2.5)
ALT: 18 U/L (ref 6–29)
AST: 28 U/L (ref 10–35)
Albumin: 4.5 g/dL (ref 3.6–5.1)
Alkaline phosphatase (APISO): 87 U/L (ref 37–153)
BUN: 7 mg/dL (ref 7–25)
CO2: 32 mmol/L (ref 20–32)
Calcium: 9 mg/dL (ref 8.6–10.4)
Chloride: 96 mmol/L — ABNORMAL LOW (ref 98–110)
Creat: 0.71 mg/dL (ref 0.50–1.05)
Globulin: 1.7 g/dL (calc) — ABNORMAL LOW (ref 1.9–3.7)
Glucose, Bld: 93 mg/dL (ref 65–99)
Potassium: 3.9 mmol/L (ref 3.5–5.3)
Sodium: 133 mmol/L — ABNORMAL LOW (ref 135–146)
Total Bilirubin: 0.4 mg/dL (ref 0.2–1.2)
Total Protein: 6.2 g/dL (ref 6.1–8.1)
eGFR: 94 mL/min/{1.73_m2} (ref >=60)

## 2020-12-15 LAB — CBC WITH DIFFERENTIAL/PLATELET
Absolute Monocytes: 251 cells/uL (ref 200–950)
Basophils Absolute: 10 cells/uL (ref 0–200)
Basophils Relative: 0.3 %
Eosinophils Absolute: 20 cells/uL (ref 15–500)
Eosinophils Relative: 0.6 %
HCT: 33.7 % — ABNORMAL LOW (ref 35.0–45.0)
Hemoglobin: 11.5 g/dL — ABNORMAL LOW (ref 11.7–15.5)
Lymphs Abs: 1571 cells/uL (ref 850–3900)
MCH: 35.1 pg — ABNORMAL HIGH (ref 27.0–33.0)
MCHC: 34.1 g/dL (ref 32.0–36.0)
MCV: 102.7 fL — ABNORMAL HIGH (ref 80.0–100.0)
MPV: 9.1 fL (ref 7.5–12.5)
Monocytes Relative: 7.6 %
Neutro Abs: 1449 cells/uL — ABNORMAL LOW (ref 1500–7800)
Neutrophils Relative %: 43.9 %
Platelets: 201 10*3/uL (ref 140–400)
RBC: 3.28 10*6/uL — ABNORMAL LOW (ref 3.80–5.10)
RDW: 12.4 % (ref 11.0–15.0)
Total Lymphocyte: 47.6 %
WBC: 3.3 10*3/uL — ABNORMAL LOW (ref 3.8–10.8)

## 2020-12-15 LAB — VITAMIN D 25 HYDROXY (VIT D DEFICIENCY, FRACTURES): Vit D, 25-Hydroxy: 89 ng/mL (ref 30–100)

## 2020-12-15 NOTE — Telephone Encounter (Signed)
-----   Message from Ofilia Neas, PA-C sent at 12/15/2020  8:02 AM EDT ----- Sodium and chloride are low.  Globulin is slightly low.   CBC stable.  We will continue to monitor.  Recommend repeating CBC and CMP in 3 months.  Vitamin D is WNL.   Please forward lab work to PCP as requested by the patient.

## 2020-12-15 NOTE — Progress Notes (Signed)
Sodium and chloride are low.  Globulin is slightly low.   CBC stable.  We will continue to monitor.  Recommend repeating CBC and CMP in 3 months.  Vitamin D is WNL.   Please forward lab work to PCP as requested by the patient.

## 2020-12-21 ENCOUNTER — Ambulatory Visit (INDEPENDENT_AMBULATORY_CARE_PROVIDER_SITE_OTHER): Payer: Medicare Other | Admitting: Physical Medicine and Rehabilitation

## 2020-12-21 ENCOUNTER — Encounter: Payer: Self-pay | Admitting: Physical Medicine and Rehabilitation

## 2020-12-21 ENCOUNTER — Other Ambulatory Visit: Payer: Self-pay

## 2020-12-21 DIAGNOSIS — R202 Paresthesia of skin: Secondary | ICD-10-CM | POA: Diagnosis not present

## 2020-12-21 NOTE — Progress Notes (Signed)
Pain left side of neck, left shoulder, left upper arm. Some symptoms on the right- right was worse in the past.  Right hand dominant No lotion per patient

## 2020-12-30 NOTE — Progress Notes (Signed)
Danielle Byrd - 65 y.o. female MRN [id number]  Date of birth: November 02, 1955  Office Visit Note: Visit Date: 12/21/2020 PCP: Allie Dimmer, MD Referred by: Allie Dimmer, MD  Subjective: Chief Complaint  Patient presents with   Left Arm - Pain   HPI:  Danielle Byrd is a 65 y.o. female who comes in today at the request of Dr. Basil Dess for electrodiagnostic study of the Bilateral upper extremities.  Patient is Right hand dominant.  She reports chronic worsening severe bilateral hand pain and bilateral arm pain.  She does get numbness and paresthesias and somewhat more in the radial digits.  Her symptoms are somewhat global.  Significant fibromyalgia history.   ROS Otherwise per HPI.  Assessment & Plan: Visit Diagnoses:    ICD-10-CM   1. 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 There is also evidence of a moderate right median nerve entrapment at the wrist (carpal tunnel syndrome) affecting sensory and motor components.    There is no significant electrodiagnostic evidence of any other focal nerve entrapment, brachial plexopathy or cervical radiculopathy.   Recommendations: 1.  Follow-up with referring physician. 2.  Continue current management of symptoms. 3.  Continue use of resting splint at night-time and as needed during the day. 4.  Suggest surgical evaluation.  Meds & Orders: No orders of the defined types were placed in this encounter.   Orders Placed This Encounter  Procedures   NCV with EMG (electromyography)    Follow-up: Return for Basil Dess, MD.   Procedures: No procedures performed  EMG & NCV Findings: Evaluation of the left median motor nerve showed prolonged distal onset latency (4.3 ms), reduced amplitude (4.1  mV), and decreased conduction velocity (Elbow-Wrist, 49 m/s).  The right median motor nerve showed prolonged distal onset latency (4.4 ms) and decreased conduction velocity (Elbow-Wrist, 47 m/s).  The left median (across palm) sensory and the right median (across palm) sensory nerves showed prolonged distal peak latency (Wrist, L4.2, R4.2 ms) and prolonged distal peak latency (Palm, L2.6, R2.3 ms).  The left ulnar sensory and the right ulnar sensory nerves showed prolonged distal peak latency (L4.2, R3.8 ms) and decreased conduction velocity (Wrist-5th Digit, L33, R37 m/s).  All remaining nerves (as indicated in the following tables) were within normal limits.  All left vs. right side differences were within normal limits.    All examined muscles (as indicated in the following table) showed no evidence of electrical instability.    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 There is also evidence of a moderate right median nerve entrapment at the wrist (carpal tunnel syndrome) affecting sensory and motor components.    There is no significant electrodiagnostic evidence of any other focal nerve entrapment, brachial plexopathy or cervical radiculopathy.   Recommendations: 1.  Follow-up with referring physician. 2.  Continue current management of symptoms. 3.  Continue use of resting splint at night-time and as needed during the day. 4.  Suggest surgical evaluation.  ___________________________ Danielle Byrd Board Certified, American Board of Physical Medicine and Rehabilitation    Nerve Conduction Studies Anti Sensory Summary Table   Stim Site NR  Peak (ms) Norm Peak (ms) P-T Amp (V) Norm P-T Amp Site1 Site2 Delta-P (ms) Dist (cm) Vel (m/s) Norm Vel (m/s)  Left Median Acr Palm Anti Sensory (2nd Digit)  30.7C  Wrist     *4.2 <3.6 27.1 >10 Wrist Palm 1.6 0.0    Palm    *2.6 <2.0 9.9         Right Median Acr Palm Anti Sensory (2nd Digit)  31.5C  Wrist    *4.2 <3.6 17.7 >10 Wrist Palm 1.9 0.0    Palm    *2.3 <2.0 22.3         Left Radial Anti Sensory (Base 1st Digit)  31.2C  Wrist    2.8 <3.1 23.1  Wrist Base 1st Digit 2.8 0.0    Right Radial Anti Sensory (Base 1st Digit)  31.3C  Wrist    2.7 <3.1 16.9  Wrist Base 1st Digit 2.7 0.0    Left Ulnar Anti Sensory (5th Digit)  31.4C  Wrist    *4.2 <3.7 25.8 >15.0 Wrist 5th Digit 4.2 14.0 *33 >38  Right Ulnar Anti Sensory (5th Digit)  31.7C  Wrist    *3.8 <3.7 23.7 >15.0 Wrist 5th Digit 3.8 14.0 *37 >38   Motor Summary Table   Stim Site NR Onset (ms) Norm Onset (ms) O-P Amp (mV) Norm O-P Amp Site1 Site2 Delta-0 (ms) Dist (cm) Vel (m/s) Norm Vel (m/s)  Left Median Motor (Abd Poll Brev)  31C  Wrist    *4.3 <4.2 *4.1 >5 Elbow Wrist 4.1 20.0 *49 >50  Elbow    8.4  5.3         Right Median Motor (Abd Poll Brev)  31.4C  Wrist    *4.4 <4.2 6.3 >5 Elbow Wrist 4.3 20.0 *47 >50  Elbow    8.7  5.3         Left Ulnar Motor (Abd Dig Min)  31.1C  Wrist    3.3 <4.2 10.7 >3 B Elbow Wrist 3.1 18.0 58 >53  B Elbow    6.4  10.7  A Elbow B Elbow 1.2 9.0 75 >53  A Elbow    7.6  7.3         Right Ulnar Motor (Abd Dig Min)  31.4C  Wrist    3.2 <4.2 11.8 >3 B Elbow Wrist 3.4 18.5 54 >53  B Elbow    6.6  11.8  A Elbow B Elbow 1.1 10.0 91 >53  A Elbow    7.7  11.5          EMG   Side Muscle Nerve Root Ins Act Fibs Psw Amp Dur Poly Recrt Int Fraser Din Comment  Left Abd Poll Brev Median C8-T1 Nml Nml Nml Nml Nml 0 Nml Nml   Left 1stDorInt Ulnar C8-T1 Nml Nml Nml Nml Nml 0 Nml Nml   Left PronatorTeres Median C6-7 Nml Nml Nml Nml Nml 0 Nml Nml   Left Biceps Musculocut C5-6 Nml Nml Nml Nml Nml 0 Nml Nml   Left Deltoid Axillary C5-6 Nml Nml Nml Nml Nml 0 Nml Nml     Nerve Conduction Studies Anti Sensory Left/Right Comparison   Stim Site L Lat (ms) R Lat (ms) L-R Lat (ms) L  Amp (V) R Amp (V) L-R Amp (%) Site1 Site2 L Vel (m/s) R Vel (m/s) L-R Vel (m/s)  Median Acr Palm Anti Sensory (2nd Digit)  30.7C  Wrist *4.2 *4.2 0.0 27.1 17.7 34.7 Wrist Palm     Palm *2.6 *2.3  0.3 9.9 22.3 55.6       Radial Anti Sensory (Base 1st Digit)  31.2C  Wrist 2.8 2.7 0.1 23.1 16.9 26.8 Wrist Base 1st Digit     Ulnar Anti Sensory (5th Digit)  31.4C  Wrist *4.2 *3.8 0.4 25.8 23.7 8.1 Wrist 5th Digit *33 *37 4   Motor Left/Right Comparison   Stim Site L Lat (ms) R Lat (ms) L-R Lat (ms) L Amp (mV) R Amp (mV) L-R Amp (%) Site1 Site2 L Vel (m/s) R Vel (m/s) L-R Vel (m/s)  Median Motor (Abd Poll Brev)  31C  Wrist *4.3 *4.4 0.1 *4.1 6.3 34.9 Elbow Wrist *49 *47 2  Elbow 8.4 8.7 0.3 5.3 5.3 0.0       Ulnar Motor (Abd Dig Min)  31.1C  Wrist 3.3 3.2 0.1 10.7 11.8 9.3 B Elbow Wrist 58 54 4  B Elbow 6.4 6.6 0.2 10.7 11.8 9.3 A Elbow B Elbow 75 91 16  A Elbow 7.6 7.7 0.1 7.3 11.5 36.5          Waveforms:                     Clinical History: No specialty comments available.     Objective:  VS:  HT:    WT:   BMI:     BP:   HR: bpm  TEMP: ( )  RESP:  Physical Exam Musculoskeletal:        General: No swelling, tenderness or deformity.     Comments: Inspection reveals no atrophy of the bilateral APB or FDI or hand intrinsics. There is no swelling, color changes, allodynia or dystrophic changes. There is 5 out of 5 strength in the bilateral wrist extension, finger abduction and long finger flexion. There is intact sensation to light touch in all dermatomal and peripheral nerve distributions.  There is a positive Phalen's test bilaterally. There is a negative Hoffmann's test bilaterally.  Skin:    General: Skin is warm and dry.     Findings: No erythema or rash.  Neurological:     General: No focal deficit present.     Mental Status: She is alert and oriented to person, place, and time.     Motor: No weakness or abnormal muscle tone.     Coordination: Coordination  normal.  Psychiatric:        Mood and Affect: Mood normal.        Behavior: Behavior normal.     Imaging: No results found.

## 2020-12-30 NOTE — Procedures (Signed)
EMG & NCV Findings: Evaluation of the left median motor nerve showed prolonged distal onset latency (4.3 ms), reduced amplitude (4.1 mV), and decreased conduction velocity (Elbow-Wrist, 49 m/s).  The right median motor nerve showed prolonged distal onset latency (4.4 ms) and decreased conduction velocity (Elbow-Wrist, 47 m/s).  The left median (across palm) sensory and the right median (across palm) sensory nerves showed prolonged distal peak latency (Wrist, L4.2, R4.2 ms) and prolonged distal peak latency (Palm, L2.6, R2.3 ms).  The left ulnar sensory and the right ulnar sensory nerves showed prolonged distal peak latency (L4.2, R3.8 ms) and decreased conduction velocity (Wrist-5th Digit, L33, R37 m/s).  All remaining nerves (as indicated in the following tables) were within normal limits.  All left vs. right side differences were within normal limits.    All examined muscles (as indicated in the following table) showed no evidence of electrical instability.    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 There is also evidence of a moderate right median nerve entrapment at the wrist (carpal tunnel syndrome) affecting sensory and motor components.    There is no significant electrodiagnostic evidence of any other focal nerve entrapment, brachial plexopathy or cervical radiculopathy.   Recommendations: 1.  Follow-up with referring physician. 2.  Continue current management of symptoms. 3.  Continue use of resting splint at night-time and as needed during the day. 4.  Suggest surgical evaluation.  ___________________________ Laurence Spates FAAPMR Board Certified, American Board of Physical Medicine and Rehabilitation    Nerve Conduction Studies Anti Sensory Summary Table   Stim Site NR Peak (ms) Norm Peak (ms) P-T Amp (V) Norm P-T  Amp Site1 Site2 Delta-P (ms) Dist (cm) Vel (m/s) Norm Vel (m/s)  Left Median Acr Palm Anti Sensory (2nd Digit)  30.7C  Wrist    *4.2 <3.6 27.1 >10 Wrist Palm 1.6 0.0    Palm    *2.6 <2.0 9.9         Right Median Acr Palm Anti Sensory (2nd Digit)  31.5C  Wrist    *4.2 <3.6 17.7 >10 Wrist Palm 1.9 0.0    Palm    *2.3 <2.0 22.3         Left Radial Anti Sensory (Base 1st Digit)  31.2C  Wrist    2.8 <3.1 23.1  Wrist Base 1st Digit 2.8 0.0    Right Radial Anti Sensory (Base 1st Digit)  31.3C  Wrist    2.7 <3.1 16.9  Wrist Base 1st Digit 2.7 0.0    Left Ulnar Anti Sensory (5th Digit)  31.4C  Wrist    *4.2 <3.7 25.8 >15.0 Wrist 5th Digit 4.2 14.0 *33 >38  Right Ulnar Anti Sensory (5th Digit)  31.7C  Wrist    *3.8 <3.7 23.7 >15.0 Wrist 5th Digit 3.8 14.0 *37 >38   Motor Summary Table   Stim Site NR Onset (ms) Norm Onset (ms) O-P Amp (mV) Norm O-P Amp Site1 Site2 Delta-0 (ms) Dist (cm) Vel (m/s) Norm Vel (m/s)  Left Median Motor (Abd Poll Brev)  31C  Wrist    *4.3 <4.2 *4.1 >5 Elbow Wrist 4.1 20.0 *49 >50  Elbow    8.4  5.3         Right Median Motor (Abd Poll Brev)  31.4C  Wrist    *4.4 <4.2 6.3 >5 Elbow Wrist 4.3 20.0 *47 >50  Elbow  8.7  5.3         Left Ulnar Motor (Abd Dig Min)  31.1C  Wrist    3.3 <4.2 10.7 >3 B Elbow Wrist 3.1 18.0 58 >53  B Elbow    6.4  10.7  A Elbow B Elbow 1.2 9.0 75 >53  A Elbow    7.6  7.3         Right Ulnar Motor (Abd Dig Min)  31.4C  Wrist    3.2 <4.2 11.8 >3 B Elbow Wrist 3.4 18.5 54 >53  B Elbow    6.6  11.8  A Elbow B Elbow 1.1 10.0 91 >53  A Elbow    7.7  11.5          EMG   Side Muscle Nerve Root Ins Act Fibs Psw Amp Dur Poly Recrt Int Fraser Din Comment  Left Abd Poll Brev Median C8-T1 Nml Nml Nml Nml Nml 0 Nml Nml   Left 1stDorInt Ulnar C8-T1 Nml Nml Nml Nml Nml 0 Nml Nml   Left PronatorTeres Median C6-7 Nml Nml Nml Nml Nml 0 Nml Nml   Left Biceps Musculocut C5-6 Nml Nml Nml Nml Nml 0 Nml Nml   Left Deltoid Axillary C5-6 Nml Nml Nml Nml Nml  0 Nml Nml     Nerve Conduction Studies Anti Sensory Left/Right Comparison   Stim Site L Lat (ms) R Lat (ms) L-R Lat (ms) L Amp (V) R Amp (V) L-R Amp (%) Site1 Site2 L Vel (m/s) R Vel (m/s) L-R Vel (m/s)  Median Acr Palm Anti Sensory (2nd Digit)  30.7C  Wrist *4.2 *4.2 0.0 27.1 17.7 34.7 Wrist Palm     Palm *2.6 *2.3 0.3 9.9 22.3 55.6       Radial Anti Sensory (Base 1st Digit)  31.2C  Wrist 2.8 2.7 0.1 23.1 16.9 26.8 Wrist Base 1st Digit     Ulnar Anti Sensory (5th Digit)  31.4C  Wrist *4.2 *3.8 0.4 25.8 23.7 8.1 Wrist 5th Digit *33 *37 4   Motor Left/Right Comparison   Stim Site L Lat (ms) R Lat (ms) L-R Lat (ms) L Amp (mV) R Amp (mV) L-R Amp (%) Site1 Site2 L Vel (m/s) R Vel (m/s) L-R Vel (m/s)  Median Motor (Abd Poll Brev)  31C  Wrist *4.3 *4.4 0.1 *4.1 6.3 34.9 Elbow Wrist *49 *47 2  Elbow 8.4 8.7 0.3 5.3 5.3 0.0       Ulnar Motor (Abd Dig Min)  31.1C  Wrist 3.3 3.2 0.1 10.7 11.8 9.3 B Elbow Wrist 58 54 4  B Elbow 6.4 6.6 0.2 10.7 11.8 9.3 A Elbow B Elbow 75 91 16  A Elbow 7.6 7.7 0.1 7.3 11.5 36.5          Waveforms:

## 2021-01-23 ENCOUNTER — Other Ambulatory Visit: Payer: Self-pay

## 2021-01-23 ENCOUNTER — Encounter: Payer: Self-pay | Admitting: Specialist

## 2021-01-23 ENCOUNTER — Ambulatory Visit (INDEPENDENT_AMBULATORY_CARE_PROVIDER_SITE_OTHER): Payer: Medicare Other | Admitting: Specialist

## 2021-01-23 VITALS — BP 132/87 | HR 78 | Ht 64.0 in | Wt 123.0 lb

## 2021-01-23 DIAGNOSIS — M47812 Spondylosis without myelopathy or radiculopathy, cervical region: Secondary | ICD-10-CM

## 2021-01-23 DIAGNOSIS — G5603 Carpal tunnel syndrome, bilateral upper limbs: Secondary | ICD-10-CM

## 2021-01-23 DIAGNOSIS — M4726 Other spondylosis with radiculopathy, lumbar region: Secondary | ICD-10-CM

## 2021-01-23 NOTE — Patient Instructions (Signed)
Plan: Carpal Tunnel Syndrome  Carpal tunnel syndrome is a condition that causes pain in your hand and arm. The carpal tunnel is a narrow area located on the palm side of your wrist. Repeated wrist motion or certain diseases may cause swelling within the tunnel. This swelling pinches the main nerve in the wrist (median nerve). What are the causes? This condition may be caused by: Repeated wrist motions. Wrist injuries. Arthritis. A cyst or tumor in the carpal tunnel. Fluid buildup during pregnancy. Sometimes the cause of this condition is not known. What increases the risk? This condition is more likely to develop in: People who have jobs that cause them to repeatedly move their wrists in the same motion, such as Art gallery manager. Women. People with certain conditions, such as: Diabetes. Obesity. An underactive thyroid (hypothyroidism). Kidney failure. What are the signs or symptoms? Symptoms of this condition include: A tingling feeling in your fingers, especially in your thumb, index, and middle fingers. Tingling or numbness in your hand. An aching feeling in your entire arm, especially when your wrist and elbow are bent for long periods of time. Wrist pain that goes up your arm to your shoulder. Pain that goes down into your palm or fingers. A weak feeling in your hands. You may have trouble grabbing and holding items. Your symptoms may feel worse during the night. How is this diagnosed? This condition is diagnosed with a medical history and physical exam. You may also have tests, including: An electromyogram (EMG). This test measures electrical signals sent by your nerves into the muscles. X-rays. How is this treated? Treatment for this condition includes: Lifestyle changes. It is important to stop doing or modify the activity that caused your condition. Physical or occupational therapy. Medicines for pain and inflammation. This may include medicine that is injected into  your wrist. A wrist splint. Surgery. Follow these instructions at home: If you have a splint:  Wear it as told by your health care provider. Remove it only as told by your health care provider. Loosen the splint if your fingers become numb and tingle, or if they turn cold and blue. Keep the splint clean and dry. General instructions  Take over-the-counter and prescription medicines only as told by your health care provider. Rest your wrist from any activity that may be causing your pain. If your condition is work related, talk to your employer about changes that can be made, such as getting a wrist pad to use while typing. If directed, apply ice to the painful area: Put ice in a plastic bag. Place a towel between your skin and the bag. Leave the ice on for 20 minutes, 2-3 times per day. Keep all follow-up visits as told by your health care provider. This is important. Do any exercises as told by your health care provider, physical therapist, or occupational therapist. Contact a health care provider if: You have new symptoms. Your pain is not controlled with medicines. Your symptoms get worse. This information is not intended to replace advice given to you by your health care provider. Make sure you discuss any questions you have with your health care provider. Document Released: 03/16/2000 Document Revised: 07/28/2015 Document Reviewed: 11/28/2016 Elsevier Interactive Patient Education  2017 [employer].

## 2021-01-23 NOTE — Progress Notes (Signed)
Office Visit Note   Patient: Danielle Byrd           Date of Birth: 1955-12-28           MRN: [id number] Visit Date: 01/23/2021              Requested by: Allie Dimmer, [place] [address] PCP: Allie Dimmer, MD   Assessment & Plan: Visit Diagnoses:  1. Bilateral carpal tunnel syndrome   2. Spondylosis without myelopathy or radiculopathy, cervical region   3. Osteoarthritis of spine with radiculopathy, lumbar region     Plan: Carpal Tunnel Syndrome  Carpal tunnel syndrome is a condition that causes pain in your hand and arm. The carpal tunnel is a narrow area located on the palm side of your wrist. Repeated wrist motion or certain diseases may cause swelling within the tunnel. This swelling pinches the main nerve in the wrist (median nerve). What are the causes? This condition may be caused by: Repeated wrist motions. Wrist injuries. Arthritis. A cyst or tumor in the carpal tunnel. Fluid buildup during pregnancy. Sometimes the cause of this condition is not known. What increases the risk? This condition is more likely to develop in: People who have jobs that cause them to repeatedly move their wrists in the same motion, such as Art gallery manager. Women. People with certain conditions, such as: Diabetes. Obesity. An underactive thyroid (hypothyroidism). Kidney failure. What are the signs or symptoms? Symptoms of this condition include: A tingling feeling in your fingers, especially in your thumb, index, and middle fingers. Tingling or numbness in your hand. An aching feeling in your entire arm, especially when your wrist and elbow are bent for long periods of time. Wrist pain that goes up your arm to your shoulder. Pain that goes down into your palm or fingers. A weak feeling in your hands. You may have trouble grabbing and holding items. Your symptoms may feel worse during the night. How is this diagnosed? This condition is  diagnosed with a medical history and physical exam. You may also have tests, including: An electromyogram (EMG). This test measures electrical signals sent by your nerves into the muscles. X-rays. How is this treated? Treatment for this condition includes: Lifestyle changes. It is important to stop doing or modify the activity that caused your condition. Physical or occupational therapy. Medicines for pain and inflammation. This may include medicine that is injected into your wrist. A wrist splint. Surgery. Follow these instructions at home: If you have a splint:  Wear it as told by your health care provider. Remove it only as told by your health care provider. Loosen the splint if your fingers become numb and tingle, or if they turn cold and blue. Keep the splint clean and dry. General instructions  Take over-the-counter and prescription medicines only as told by your health care provider. Rest your wrist from any activity that may be causing your pain. If your condition is work related, talk to your employer about changes that can be made, such as getting a wrist pad to use while typing. If directed, apply ice to the painful area: Put ice in a plastic bag. Place a towel between your skin and the bag. Leave the ice on for 20 minutes, 2-3 times per day. Keep all follow-up visits as told by your health care provider. This is important. Do any exercises as told by your health care provider, physical therapist, or occupational therapist. Contact a health care provider  if: You have new symptoms. Your pain is not controlled with medicines. Your symptoms get worse. This information is not intended to replace advice given to you by your health care provider. Make sure you discuss any questions you have with your health care provider. Document Released: 03/16/2000 Document Revised: 07/28/2015 Document Reviewed: 11/28/2016 Elsevier Interactive Patient Education  2017 [place] Instructions: No follow-ups on file.   Orders:  No orders of the defined types were placed in this encounter.  No orders of the defined types were placed in this encounter.     Procedures: No procedures performed   Clinical Data: No additional findings.   Subjective: Chief Complaint  Patient presents with   Right Hand - Follow-up    EMG Review   Left Hand - Follow-up    EMG Review    65 year old right handed female with right greater than left hand numbness and tingling. Neck stiffnes and mild cervacalgia. Underwent EMG/NCV and this show mod-severe left CTS and moderate right CTS. She has been using the wrist splints for the last month, in the AM and after lunch she reports the stiffness improved. She slept with the splints at first but has been able to go without the splint at night fine. No bowel or bladder concerns other than constipation.   Review of Systems   Objective: Vital Signs: BP 132/87 (BP Location: Left Arm, Patient Position: Sitting)   Pulse 78   Ht '5\' 4"'  (1.626 m)   Wt 123 lb (55.8 kg)   BMI 21.11 kg/m   Physical Exam Constitutional:      Appearance: She is well-developed.  HENT:     Head: Normocephalic and atraumatic.  Eyes:     Pupils: Pupils are equal, round, and reactive to light.  Pulmonary:     Effort: Pulmonary effort is normal.     Breath sounds: Normal breath sounds.  Abdominal:     General: Bowel sounds are normal.     Palpations: Abdomen is soft.  Musculoskeletal:        General: Normal range of motion.     Cervical back: Normal range of motion and neck supple.  Skin:    General: Skin is warm and dry.  Neurological:     Mental Status: She is alert and oriented to person, place, and time.  Psychiatric:        Behavior: Behavior normal.        Thought Content: Thought content normal.        Judgment: Judgment normal.   Right Hand Exam   Tests  Phalen's sign: positive Tinel's sign (median nerve):  positive   Left Hand Exam   Tests  Phalen's sign: positive Tinel's sign (median nerve): positive    Specialty Comments:  No specialty comments available.  Imaging: No results found.   PMFS History: Patient Active Problem List   Diagnosis Date Noted   Left thigh pain 05/05/2019   Osteoarthritis of lumbar spine 05/05/2019   Hx of colonic polyps 09/05/2017   Family hx of colon cancer 09/05/2017   Migraine without aura and without status migrainosus, not intractable 03/04/2017   Fibromyalgia 11/22/2016   Other fatigue 11/22/2016   DDD (degenerative disc disease), cervical 11/22/2016   Primary osteoarthritis of both hands 11/22/2016   History of migraine 11/22/2016   History of anxiety 11/22/2016   History of hypothyroidism 11/22/2016   History of kidney stones 11/22/2016   History of cholecystectomy 11/22/2016  History of anemia 11/22/2016   Hx of bone density study/ normal 2014 this is followed by her Primary Care  11/22/2016   Sicca syndrome ([place]) 11/22/2016   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   Headache(784.0) 10/27/2013   Leukopenia 07/08/2013   Anemia 10/31/2011   Iron deficiency 10/31/2011   GERD (gastroesophageal reflux disease) 05/14/2011   Abdominal pain, bilateral upper quadrant 05/14/2011   H/O fibromyalgia 05/14/2011   Constipation 05/14/2011   Hyperlipemia 05/14/2011   Kidney stones 01/11/2011   Past Medical History:  Diagnosis Date   Anemia    Anxiety    Arthritis    Cervical stenosis (uterine cervix)    Closed fracture of left foot    Constipation    Copper deficiency    Depression    Fibromyalgia    GERD (gastroesophageal reflux disease)    High cholesterol    History of kidney stones    Hyperlipidemia    Hypothyroidism    Kidney stones    Lumbar stenosis 11/27/2016   Melanosis coli    Migraines    Nephrolithiasis    Osteopenia 12/2019   Per patient, diagnosed by PCP Allie Dimmer, MD   Osteoporosis 12/2019   Per patient,  diagnosed by PCP Allie Dimmer, MD   Panic attack    Rosacea    Sliding hiatal hernia    Spondylosis, cervical     Family History  Problem Relation Age of Onset   Diabetes Mother    Hypertension Mother    Anemia Mother    Colon cancer Father    Cancer Father 85       colon cancer   Heart attack Father     Past Surgical History:  Procedure Laterality Date   ABDOMINAL HYSTERECTOMY     BONE MARROW BIOPSY     & aspirate   BREAST SURGERY Right    partial mastectomy   CHOLECYSTECTOMY     COLONOSCOPY N/A 07/22/2012   Procedure: COLONOSCOPY;  Surgeon: [name], MD;  [facility]: [facility];  Service: Endoscopy;  Laterality: N/A;  730   COLONOSCOPY WITH PROPOFOL N/A 10/25/2017   Procedure: COLONOSCOPY WITH PROPOFOL;  Surgeon: [name], MD;  Location: [facility];  Service: Endoscopy;  Laterality: N/A;  10:30   DILATION AND CURETTAGE OF UTERUS     EYE SURGERY     sty removal and reconstruction of eye lids.   NISSEN FUNDOPLICATION     POLYPECTOMY  10/25/2017   Procedure: POLYPECTOMY;  Surgeon: [name], MD;  Location: [facility];  Service: Endoscopy;;  sigmoid    Social History   Occupational History    Employer: [employer]    Comment: no longer  Tobacco Use   Smoking status: Never   Smokeless tobacco: Never  Vaping Use   Vaping Use: Never used  Substance and Sexual Activity   Alcohol use: No    Alcohol/week: 0.0 standard drinks   Drug use: No   Sexual activity: Never    Birth control/protection: Surgical

## 2021-01-26 ENCOUNTER — Other Ambulatory Visit: Payer: Self-pay

## 2021-01-26 ENCOUNTER — Encounter: Payer: Self-pay | Admitting: Orthopedic Surgery

## 2021-01-26 ENCOUNTER — Ambulatory Visit (INDEPENDENT_AMBULATORY_CARE_PROVIDER_SITE_OTHER): Payer: Medicare Other | Admitting: Orthopedic Surgery

## 2021-01-26 DIAGNOSIS — M79641 Pain in right hand: Secondary | ICD-10-CM | POA: Insufficient documentation

## 2021-01-26 DIAGNOSIS — M67431 Ganglion, right wrist: Secondary | ICD-10-CM | POA: Diagnosis not present

## 2021-01-26 DIAGNOSIS — M79642 Pain in left hand: Secondary | ICD-10-CM

## 2021-01-26 NOTE — Progress Notes (Signed)
Office Visit Note   Patient: Danielle Byrd           Date of Birth: 09-07-1955           MRN: [id number] Visit Date: 01/26/2021              Requested by: Allie Dimmer, [place] [address] PCP: Allie Dimmer, MD   Assessment & Plan: Visit Diagnoses:  1. Bilateral hand pain   2. Ganglion cyst of volar aspect of right wrist     Plan: Patient was originally referred to me with concern for bilateral carpal tunnel syndrome that was confirmed on EMG/NCS.  She denies any numbness or tingling in her fingers.  She has no nocturnal numbness or tingling.  Her only real complaint today is occasional swelling at the ulnar aspect of the wrist that is occasionally associated with mild shooting pain but is improving since starting plaquenil.  She also has a right volar ganglion cyst at her wrist that is asymptomatic.  She is not interested in treatment for CTS or her ganglion cyst given that she is asymptomatic.  She can continue to wear her braces and take the plaquenil as directed by her other physician.  She can follow up with me as needed.   Follow-Up Instructions: No follow-ups on file.   Orders:  No orders of the defined types were placed in this encounter.  No orders of the defined types were placed in this encounter.     Procedures: No procedures performed   Clinical Data: No additional findings.   Subjective: Chief Complaint  Patient presents with   Right Hand - New Patient (Initial Visit)   Left Hand - New Patient (Initial Visit)    This is a 65 yo RHD F who presents w/ minimal complaints today.  She has been followed by another physician for ulnar sided wrist pain and swelling.  She was given plaquenil for this problem which has improved her symptoms. She has also been wearing bilateral wrist braces.  Her ulnar wrist is asymptomatic today.  She was also told that she has CTS which was confirmed on an EMG/NCS.  She denies any history of  numbness or tingling in her fingers.  She has no nocturnal numbness or paresthesias.  She does have a volar ganglion cyst of the right wrist which is asymptomatic.  She is not interested in any treatment today.    Review of Systems   Objective: Vital Signs: BP 138/87 (BP Location: Left Arm, Patient Position: Sitting, Cuff Size: Normal)   Pulse 77   Ht '5\' 4"'  (1.626 m)   Wt 123 lb (55.8 kg)   SpO2 97%   BMI 21.11 kg/m   Physical Exam Constitutional:      Appearance: Normal appearance.  Cardiovascular:     Rate and Rhythm: Normal rate.     Pulses: Normal pulses.  Skin:    General: Skin is warm and dry.     Capillary Refill: Capillary refill takes less than 2 seconds.  Neurological:     Mental Status: She is alert.    Right Hand Exam   Tenderness  The patient is experiencing no tenderness.   Range of Motion  The patient has normal right wrist ROM.   Other  Erythema: absent Sensation: normal Pulse: present  Comments:  Mildly positive carpal tunnel compression test.  Negative Tinel and Phalen's test.  Small, 1 x 1 cm volar ganglion cyst at the wrist.  Mild ulnar swelling around distal ulnar head but without TTP. Able to make complete fist and fully extend fingers.    Left Hand Exam   Tenderness  The patient is experiencing no tenderness.   Range of Motion  The patient has normal left wrist ROM.  Other  Sensation: normal Pulse: present  Comments:  Mildly positive carpal tunnel compression test.  Negative Tinel and Phalen's test.  Mild ulnar swelling around distal ulnar head but without TTP. Able to make complete fist and fully extend fingers.      Specialty Comments:  No specialty comments available.  Imaging: No results found.   PMFS History: Patient Active Problem List   Diagnosis Date Noted   Bilateral hand pain 01/26/2021   Ganglion cyst of volar aspect of right wrist 01/26/2021   Left thigh pain 05/05/2019   Osteoarthritis of lumbar spine  05/05/2019   Hx of colonic polyps 09/05/2017   Family hx of colon cancer 09/05/2017   Migraine without aura and without status migrainosus, not intractable 03/04/2017   Fibromyalgia 11/22/2016   Other fatigue 11/22/2016   DDD (degenerative disc disease), cervical 11/22/2016   Primary osteoarthritis of both hands 11/22/2016   History of migraine 11/22/2016   History of anxiety 11/22/2016   History of hypothyroidism 11/22/2016   History of kidney stones 11/22/2016   History of cholecystectomy 11/22/2016   History of anemia 11/22/2016   Hx of bone density study/ normal 2014 this is followed by her Primary Care  11/22/2016   Sicca syndrome ([place]) 11/22/2016   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   Headache(784.0) 10/27/2013   Leukopenia 07/08/2013   Anemia 10/31/2011   Iron deficiency 10/31/2011   GERD (gastroesophageal reflux disease) 05/14/2011   Abdominal pain, bilateral upper quadrant 05/14/2011   H/O fibromyalgia 05/14/2011   Constipation 05/14/2011   Hyperlipemia 05/14/2011   Kidney stones 01/11/2011   Past Medical History:  Diagnosis Date   Anemia    Anxiety    Arthritis    Cervical stenosis (uterine cervix)    Closed fracture of left foot    Constipation    Copper deficiency    Depression    Fibromyalgia    GERD (gastroesophageal reflux disease)    High cholesterol    History of kidney stones    Hyperlipidemia    Hypothyroidism    Kidney stones    Lumbar stenosis 11/27/2016   Melanosis coli    Migraines    Nephrolithiasis    Osteopenia 12/2019   Per patient, diagnosed by PCP Allie Dimmer, MD   Osteoporosis 12/2019   Per patient, diagnosed by PCP Allie Dimmer, MD   Panic attack    Rosacea    Sliding hiatal hernia    Spondylosis, cervical     Family History  Problem Relation Age of Onset   Diabetes Mother    Hypertension Mother    Anemia Mother    Colon cancer Father    Cancer Father 51       colon cancer   Heart attack Father     Past Surgical  History:  Procedure Laterality Date   ABDOMINAL HYSTERECTOMY     BONE MARROW BIOPSY     & aspirate   BREAST SURGERY Right    partial mastectomy   CHOLECYSTECTOMY     COLONOSCOPY N/A 07/22/2012   Procedure: COLONOSCOPY;  Surgeon: [name], MD;  [facility];  Service: Endoscopy;  Laterality: N/A;  730   COLONOSCOPY WITH PROPOFOL N/A 10/25/2017  Procedure: COLONOSCOPY WITH PROPOFOL;  Surgeon: [name], MD;  [facility];  Service: Endoscopy;  Laterality: N/A;  10:30   DILATION AND CURETTAGE OF UTERUS     EYE SURGERY     sty removal and reconstruction of eye lids.   NISSEN FUNDOPLICATION     POLYPECTOMY  10/25/2017   Procedure: POLYPECTOMY;  Surgeon: [name], MD;  [facility];  Service: Endoscopy;;  sigmoid    Social History   Occupational History    Employer: [employer]    Comment: no longer  Tobacco Use   Smoking status: Never   Smokeless tobacco: Never  Vaping Use   Vaping Use: Never used  Substance and Sexual Activity   Alcohol use: No    Alcohol/week: 0.0 standard drinks   Drug use: No   Sexual activity: Never    Birth control/protection: Surgical

## 2021-01-27 ENCOUNTER — Other Ambulatory Visit: Payer: Self-pay | Admitting: Physician Assistant

## 2021-01-30 NOTE — Telephone Encounter (Signed)
Next Visit: 03/16/2021  Last Visit: 12/14/2020  Labs: 12/29/2020 Sodium 132, Total Protein 5.9, Globulin 1.4, Albumin/Globulin 3.2, Osmolality Calc 272, WBC 2.9, RBC 3.15, Hgb 11, Hct 31.5, MCV 100, MCH 34.9, MPV 8.5, Seg 31.7, Lymph 56.6, Absolute Neut 0.92, Absolute Eos 0.03  Eye exam: 10/31/2020   Current Dose per office note 12/14/2020: Plaquenil 200 mg 1 tablet by mouth once daily  YW:[id number] rheumatoid arthritis   Last Fill: 11/01/2020  Okay to refill Plaquenil?

## 2021-03-03 NOTE — Progress Notes (Signed)
Office Visit Note  Patient: Danielle Byrd             Date of Birth: 05-07-1955           MRN: [id number]             PCP: Allie Dimmer, MD Referring: Allie Dimmer, MD Visit Date: 03/16/2021 Occupation: _0 @  Subjective:  Medication management   History of Present Illness: Danielle Byrd is a 65 y.o. female with a history of rheumatoid arthritis and osteoarthritis.  She states she has noticed improvement in her hand pain and discomfort since she has been taking hydroxychloroquine.  She had nerve conduction velocities done by Dr. Ernestina Patches which showed bilateral carpal tunnel syndrome.  She was evaluated by Dr. Tempie Donning but because she was asymptomatic no treatment was advised.  She continues to have discomfort in her neck and trapezius region.  She also has discomfort from bilateral ischial bursitis.  She continues to have generalized pain and discomfort from fibromyalgia especially with the colder temperatures.  She continues to have dry mouth and dry eyes symptoms.  Activities of Daily Living:  Patient reports morning stiffness for 4 hours.   Patient Reports nocturnal pain.  Difficulty dressing/grooming: Denies Difficulty climbing stairs: Denies Difficulty getting out of chair: Denies Difficulty using hands for taps, buttons, cutlery, and/or writing: Reports  Review of Systems  Constitutional:  Positive for fatigue.  HENT:  Positive for mouth dryness and nose dryness. Negative for mouth sores.   Eyes:  Positive for dryness. Negative for pain and itching.  Respiratory:  Negative for shortness of breath and difficulty breathing.   Cardiovascular:  Negative for chest pain and palpitations.  Gastrointestinal:  Positive for constipation. Negative for blood in stool and diarrhea.  Endocrine: Negative for increased urination.  Genitourinary:  Negative for difficulty urinating.  Musculoskeletal:  Positive for joint pain, joint pain, myalgias, morning stiffness, muscle  tenderness and myalgias. Negative for joint swelling.  Skin:  Negative for color change, rash and redness.  Allergic/Immunologic: Negative for susceptible to infections.  Neurological:  Positive for dizziness and weakness. Negative for numbness, headaches and memory loss.  Hematological:  Positive for bruising/bleeding tendency.  Psychiatric/Behavioral:  Negative for confusion.    PMFS History:  Patient Active Problem List   Diagnosis Date Noted   Bilateral hand pain 01/26/2021   Ganglion cyst of volar aspect of right wrist 01/26/2021   Left thigh pain 05/05/2019   Osteoarthritis of lumbar spine 05/05/2019   Hx of colonic polyps 09/05/2017   Family hx of colon cancer 09/05/2017   Migraine without aura and without status migrainosus, not intractable 03/04/2017   Fibromyalgia 11/22/2016   Other fatigue 11/22/2016   DDD (degenerative disc disease), cervical 11/22/2016   Primary osteoarthritis of both hands 11/22/2016   History of migraine 11/22/2016   History of anxiety 11/22/2016   History of hypothyroidism 11/22/2016   History of kidney stones 11/22/2016   History of cholecystectomy 11/22/2016   History of anemia 11/22/2016   Hx of bone density study/ normal 2014 this is followed by her Primary Care  11/22/2016   Sicca syndrome ([place]) 11/22/2016   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   Headache(784.0) 10/27/2013   Leukopenia 07/08/2013   Anemia 10/31/2011   Iron deficiency 10/31/2011   GERD (gastroesophageal reflux disease) 05/14/2011   Abdominal pain, bilateral upper quadrant 05/14/2011   H/O fibromyalgia 05/14/2011   Constipation 05/14/2011   Hyperlipemia 05/14/2011   Kidney stones 01/11/2011    Past Medical History:  Diagnosis Date   Anemia    Anxiety    Arthritis    Cervical stenosis (uterine cervix)    Closed fracture of left foot    Constipation    Copper deficiency    Depression    Fibromyalgia    GERD (gastroesophageal reflux disease)    High cholesterol     History of kidney stones    Hyperlipidemia    Hypothyroidism    Kidney stones    Lumbar stenosis 11/27/2016   Melanosis coli    Migraines    Nephrolithiasis    Osteopenia 12/2019   Per patient, diagnosed by PCP Allie Dimmer, MD   Osteoporosis 12/2019   Per patient, diagnosed by PCP Allie Dimmer, MD   Panic attack    Rosacea    Sliding hiatal hernia    Spondylosis, cervical     Family History  Problem Relation Age of Onset   Diabetes Mother    Hypertension Mother    Anemia Mother    Colon cancer Father    Cancer Father 33       colon cancer   Heart attack Father    Past Surgical History:  Procedure Laterality Date   ABDOMINAL HYSTERECTOMY     BONE MARROW BIOPSY     & aspirate   BREAST SURGERY Right    partial mastectomy   CHOLECYSTECTOMY     COLONOSCOPY N/A 07/22/2012   Procedure: COLONOSCOPY;  Surgeon: [name], MD;  [facility];  Service: Endoscopy;  Laterality: N/A;  730   COLONOSCOPY WITH PROPOFOL N/A 10/25/2017   Procedure: COLONOSCOPY WITH PROPOFOL;  Surgeon: [name], MD;  [facility];  Service: Endoscopy;  Laterality: N/A;  10:30   DILATION AND CURETTAGE OF UTERUS     EYE SURGERY     sty removal and reconstruction of eye lids.   NISSEN FUNDOPLICATION     POLYPECTOMY  10/25/2017   Procedure: POLYPECTOMY;  Surgeon: [name], MD;  [facility];  Service: Endoscopy;;  sigmoid    Social History   Social History Narrative   Patient lives at home with chacha (boxer, therapy dog)   Patient right handed   Patient has a BS degree.   Patient drinks 2 cups of tea daily.    There is no immunization history on file for this patient.   Objective: Vital Signs: BP 133/85 (BP Location: Left Arm, Patient Position: Sitting, Cuff Size: Normal)   Pulse 77   Ht 5' 5.5" (1.664 m)   Wt 121 lb (54.9 kg)   BMI 19.83 kg/m    Physical Exam Vitals and nursing note reviewed.  Constitutional:      Appearance: She  is well-developed.  HENT:     Head: Normocephalic and atraumatic.  Eyes:     Conjunctiva/sclera: Conjunctivae normal.  Cardiovascular:     Rate and Rhythm: Normal rate and regular rhythm.     Heart sounds: Normal heart sounds.  Pulmonary:     Effort: Pulmonary effort is normal.     Breath sounds: Normal breath sounds.  Abdominal:     General: Bowel sounds are normal.     Palpations: Abdomen is soft.  Musculoskeletal:     Cervical back: Normal range of motion.  Lymphadenopathy:     Cervical: No cervical adenopathy.  Skin:    General: Skin is warm and dry.     Capillary Refill: Capillary refill takes less than 2 seconds.  Neurological:  Mental Status: She is alert and oriented to person, place, and time.  Psychiatric:        Behavior: Behavior normal.     Musculoskeletal Exam: C-spine was in good range of motion.  She had discomfort over bilateral trapezius region.  Shoulder joints, elbow joints, wrist joints, MCPs PIPs and DIPs with good range of motion.  No synovitis was noted.  Hip joints and knee joints with good range of motion.  She had tenderness over bilateral ischial bursa.  There was no tenderness over ankles or MTPs.  Therapy nice patient request samples actually  CDAI Exam: CDAI Score: 0.7  Patient Global: 5 mm; Provider Global: 2 mm Swollen: 0 ; Tender: 0  Joint Exam 03/16/2021   No joint exam has been documented for this visit   There is currently no information documented on the homunculus. Go to the Rheumatology activity and complete the homunculus joint exam.  Investigation: No additional findings.  Imaging: No results found.  Recent Labs: Lab Results  Component Value Date   WBC 3.3 (L) 12/14/2020   HGB 11.5 (L) 12/14/2020   PLT 201 12/14/2020   NA 133 (L) 12/14/2020   K 3.9 12/14/2020   CL 96 (L) 12/14/2020   CO2 32 12/14/2020   GLUCOSE 93 12/14/2020   BUN 7 12/14/2020   CREATININE 0.71 12/14/2020   BILITOT 0.4 12/14/2020   ALKPHOS 72  10/30/2017   AST 28 12/14/2020   ALT 18 12/14/2020   PROT 6.2 12/14/2020   ALBUMIN 3.9 10/30/2017   CALCIUM 9.0 12/14/2020   GFRAA >60 10/30/2017    Speciality Comments: PLQ eye exam appt 10/31/2020 normal [facility] f/u 12 months Macular OCT - WNL @ annual eye exam PLQ 08/22  Procedures:  Trigger Point Inj  Date/Time: 03/16/2021 3:04 PM Performed by: Bo Merino, MD Authorized by: Bo Merino, MD   Consent Given by:  Patient Site marked: the procedure site was marked   Timeout: prior to procedure the correct patient, procedure, and site was verified   Indications:  Muscle spasm and pain Total # of Trigger Points:  2 Location: neck   Needle Size:  27 G Approach:  Dorsal Medications #1:  0.5 mL lidocaine 1 %; 10 mg triamcinolone acetonide 40 MG/ML Medications #2:  0.5 mL lidocaine 1 %; 10 mg triamcinolone acetonide 40 MG/ML Patient tolerance:  Patient tolerated the procedure well with no immediate complications Allergies: Oxycodone, Reglan [metoclopramide], Tramadol hcl, Codeine, Cymbalta [duloxetine hcl], Eggs or egg-derived products, Gabapentin, Hydrocodone-acetaminophen, Penicillins, Savella [milnacipran hcl], Savella [milnacipran], Statins, Voltaren [diclofenac sodium], Zanaflex [tizanidine hcl], and Lyrica [pregabalin]   Assessment / Plan:     Visit Diagnoses: Seronegative rheumatoid arthritis ([place]) - RF-, anti-CCP-, 14-3-3 eta-, ANA-: Patient has no synovitis on my examination.  She has noticed improvement in her symptoms since she has been on hydroxychloroquine.  High risk medication use - Plaquenil 200 mg 1 tablet by mouth once daily.  PLQ eye exam: 10/31/2020.  She has mild neutropenia which has been stable.  She had neutropenia even prior to starting hydroxychloroquine.- Plan: CBC with Differential/Platelet, COMPLETE METABOLIC PANEL WITH GFR today and then every 3 months to monitor for drug toxicity.  Information regarding immunization was placed  in the AVS.  Sicca syndrome ([facility])-most likely due to the use of medications.  Over-the-counter products were discussed.  Primary osteoarthritis of both hands - 10/17/2020: Ultrasound examination showed synovitis in bilateral wrist joints.  No median nerve enlargement was noted.  She had nerve  conduction velocities done by Dr. Ernestina Patches which showed bilateral moderate to severe carpal tunnel syndrome.  I reviewed the report with the patient.  She was also evaluated by Dr. Tempie Donning who did not suggest surgery as patient was asymptomatic.  Patient states she continues to have pain and discomfort in her hands but no tingling.  DDD (degenerative disc disease), cervical-she has chronic discomfort in her C-spine.  Trapezius muscle spasm - X-rays of the C-spine revealed multilevel spondylosis and facet joint arthropathy.  Patient requested trapezius injection.  After informed consent was obtained bilateral trapezius area were injected as described above.  She taught the procedure well.  Postprocedure instructions were given.  Fibromyalgia-need for regular exercise and stretching was discussed.  Other fatigue-related to fibromyalgia and insomnia.  Primary insomnia - trazodone 50 mg 1 tablet at bedtime as needed for insomnia.     Ischial bursitis of left side-she has been using a cushion which has been helpful.  Ischial bursitis, right  Age-related osteoporosis without current pathological fracture - DEXA 12/14/19: [facility] spine BMD 0.780 with T-score -2.4. Ordered by PCP.  Patient does not want to take any medications at this point.  Other medical problems are listed as follows:  Vitamin D deficiency  History of kidney stones  History of anxiety  History of anemia  Pedal edema  History of migraine  History of hypothyroidism  Family history of diabetes mellitus  Orders: Orders Placed This Encounter  Procedures   CBC with Differential/Platelet   COMPLETE METABOLIC PANEL WITH GFR    No  orders of the defined types were placed in this encounter.    Follow-Up Instructions: Return in about 3 months (around 06/14/2021) for Rheumatoid arthritis, Osteoarthritis.   Bo Merino, MD  Note - This record has been created using Editor, commissioning.  Chart creation errors have been sought, but may not always  have been located. Such creation errors do not reflect on  the standard of medical care.

## 2021-03-16 ENCOUNTER — Encounter: Payer: Self-pay | Admitting: Rheumatology

## 2021-03-16 ENCOUNTER — Other Ambulatory Visit: Payer: Self-pay

## 2021-03-16 ENCOUNTER — Ambulatory Visit (INDEPENDENT_AMBULATORY_CARE_PROVIDER_SITE_OTHER): Payer: Medicare Other | Admitting: Rheumatology

## 2021-03-16 VITALS — BP 133/85 | HR 77 | Ht 65.5 in | Wt 121.0 lb

## 2021-03-16 DIAGNOSIS — M810 Age-related osteoporosis without current pathological fracture: Secondary | ICD-10-CM

## 2021-03-16 DIAGNOSIS — M62838 Other muscle spasm: Secondary | ICD-10-CM | POA: Diagnosis not present

## 2021-03-16 DIAGNOSIS — Z79899 Other long term (current) drug therapy: Secondary | ICD-10-CM | POA: Diagnosis not present

## 2021-03-16 DIAGNOSIS — Z8659 Personal history of other mental and behavioral disorders: Secondary | ICD-10-CM

## 2021-03-16 DIAGNOSIS — M3500 Sicca syndrome, unspecified: Secondary | ICD-10-CM

## 2021-03-16 DIAGNOSIS — Z87442 Personal history of urinary calculi: Secondary | ICD-10-CM

## 2021-03-16 DIAGNOSIS — Z862 Personal history of diseases of the blood and blood-forming organs and certain disorders involving the immune mechanism: Secondary | ICD-10-CM

## 2021-03-16 DIAGNOSIS — Z9049 Acquired absence of other specified parts of digestive tract: Secondary | ICD-10-CM

## 2021-03-16 DIAGNOSIS — H539 Unspecified visual disturbance: Secondary | ICD-10-CM

## 2021-03-16 DIAGNOSIS — M0600 Rheumatoid arthritis without rheumatoid factor, unspecified site: Secondary | ICD-10-CM | POA: Diagnosis not present

## 2021-03-16 DIAGNOSIS — E559 Vitamin D deficiency, unspecified: Secondary | ICD-10-CM

## 2021-03-16 DIAGNOSIS — M19041 Primary osteoarthritis, right hand: Secondary | ICD-10-CM | POA: Diagnosis not present

## 2021-03-16 DIAGNOSIS — R5383 Other fatigue: Secondary | ICD-10-CM

## 2021-03-16 DIAGNOSIS — Z8639 Personal history of other endocrine, nutritional and metabolic disease: Secondary | ICD-10-CM

## 2021-03-16 DIAGNOSIS — M7072 Other bursitis of hip, left hip: Secondary | ICD-10-CM

## 2021-03-16 DIAGNOSIS — F5101 Primary insomnia: Secondary | ICD-10-CM

## 2021-03-16 DIAGNOSIS — Z8669 Personal history of other diseases of the nervous system and sense organs: Secondary | ICD-10-CM

## 2021-03-16 DIAGNOSIS — M7071 Other bursitis of hip, right hip: Secondary | ICD-10-CM

## 2021-03-16 DIAGNOSIS — M19042 Primary osteoarthritis, left hand: Secondary | ICD-10-CM

## 2021-03-16 DIAGNOSIS — R600 Localized edema: Secondary | ICD-10-CM

## 2021-03-16 DIAGNOSIS — M797 Fibromyalgia: Secondary | ICD-10-CM

## 2021-03-16 DIAGNOSIS — M5030 Other cervical disc degeneration, unspecified cervical region: Secondary | ICD-10-CM

## 2021-03-16 DIAGNOSIS — M2550 Pain in unspecified joint: Secondary | ICD-10-CM

## 2021-03-16 DIAGNOSIS — Z833 Family history of diabetes mellitus: Secondary | ICD-10-CM

## 2021-03-16 MED ORDER — LIDOCAINE HCL 1 % IJ SOLN
0.5000 mL | INTRAMUSCULAR | Status: AC | PRN
  Administered 2021-03-16: .5 mL

## 2021-03-16 MED ORDER — TRIAMCINOLONE ACETONIDE 40 MG/ML IJ SUSP
10.0000 mg | INTRAMUSCULAR | Status: AC | PRN
  Administered 2021-03-16: 10 mg via INTRAMUSCULAR

## 2021-03-16 NOTE — Patient Instructions (Addendum)
Vaccines You are taking a medication(s) that can suppress your immune system.  The following immunizations are recommended: Flu annually Covid-19  Td/Tdap (tetanus, diphtheria, pertussis) every 10 years Pneumonia (Prevnar 15 then Pneumovax 23 at least 1 year apart.  Alternatively, can take Prevnar 20 without needing additional dose) Shingrix: 2 doses from 4 weeks to 6 months apart  Please check with your PCP to make sure you are up to date.    Standing Labs We placed an order today for your standing lab work.   Please have your standing labs drawn in March and every 3 months  If possible, please have your labs drawn 2 weeks prior to your appointment so that the provider can discuss your results at your appointment.  Please note that you may see your imaging and lab results in [place] before we have reviewed them. We may be awaiting multiple results to interpret others before contacting you. Please allow our office up to 72 hours to thoroughly review all of the results before contacting the office for clarification of your results.  We have open lab daily: Monday through Thursday from 1:30-4:30 PM and Friday from 1:30-4:00 PM at the office of Dr. Bo Merino, [facility].   Please be advised, all patients with office appointments requiring lab work will take precedent over walk-in lab work.  If possible, please come for your lab work on Monday and Friday afternoons, as you may experience shorter wait times. The office is located at [address] [place], [place], [place] [address] No appointment is necessary.   Labs are drawn by [facility]. Please bring your co-pay at the time of your lab draw.  You may receive a bill from [place] for your lab work.  If you wish to have your labs drawn at another location, please call the office 24 hours in advance to send orders.  If you have any questions regarding directions or hours of operation,  please call [phone].   As a  reminder, please drink plenty of water prior to coming for your lab work. Thanks!

## 2021-03-17 ENCOUNTER — Other Ambulatory Visit: Payer: Self-pay

## 2021-03-17 DIAGNOSIS — Z79899 Other long term (current) drug therapy: Secondary | ICD-10-CM

## 2021-03-17 LAB — COMPLETE METABOLIC PANEL WITH GFR
AG Ratio: 2.5 (calc) (ref 1.0–2.5)
ALT: 13 U/L (ref 6–29)
AST: 19 U/L (ref 10–35)
Albumin: 4.2 g/dL (ref 3.6–5.1)
Alkaline phosphatase (APISO): 92 U/L (ref 37–153)
BUN/Creatinine Ratio: 7 (calc) (ref 6–22)
BUN: 5 mg/dL — ABNORMAL LOW (ref 7–25)
CO2: 32 mmol/L (ref 20–32)
Calcium: 8.9 mg/dL (ref 8.6–10.4)
Chloride: 96 mmol/L — ABNORMAL LOW (ref 98–110)
Creat: 0.68 mg/dL (ref 0.50–1.05)
Globulin: 1.7 g/dL (calc) — ABNORMAL LOW (ref 1.9–3.7)
Glucose, Bld: 98 mg/dL (ref 65–99)
Potassium: 4.1 mmol/L (ref 3.5–5.3)
Sodium: 133 mmol/L — ABNORMAL LOW (ref 135–146)
Total Bilirubin: 0.4 mg/dL (ref 0.2–1.2)
Total Protein: 5.9 g/dL — ABNORMAL LOW (ref 6.1–8.1)
eGFR: 96 mL/min/{1.73_m2} (ref >=60)

## 2021-03-17 LAB — CBC WITH DIFFERENTIAL/PLATELET
Absolute Monocytes: 293 cells/uL (ref 200–950)
Basophils Absolute: 9 cells/uL (ref 0–200)
Basophils Relative: 0.3 %
Eosinophils Absolute: 29 cells/uL (ref 15–500)
Eosinophils Relative: 1 %
HCT: 33 % — ABNORMAL LOW (ref 35.0–45.0)
Hemoglobin: 11.3 g/dL — ABNORMAL LOW (ref 11.7–15.5)
Lymphs Abs: 1340 cells/uL (ref 850–3900)
MCH: 35.3 pg — ABNORMAL HIGH (ref 27.0–33.0)
MCHC: 34.2 g/dL (ref 32.0–36.0)
MCV: 103.1 fL — ABNORMAL HIGH (ref 80.0–100.0)
MPV: 9.6 fL (ref 7.5–12.5)
Monocytes Relative: 10.1 %
Neutro Abs: 1230 cells/uL — ABNORMAL LOW (ref 1500–7800)
Neutrophils Relative %: 42.4 %
Platelets: 202 10*3/uL (ref 140–400)
RBC: 3.2 10*6/uL — ABNORMAL LOW (ref 3.80–5.10)
RDW: 12.2 % (ref 11.0–15.0)
Total Lymphocyte: 46.2 %
WBC: 2.9 10*3/uL — ABNORMAL LOW (ref 3.8–10.8)

## 2021-03-17 NOTE — Progress Notes (Signed)
Neutropenia and anemia noted.  Sodium and chloride are low.  Total protein is low.  Please advise patient to reduce hydroxychloroquine to 1 tablet every other day.  Repeat CBC in 1 month.

## 2021-03-17 NOTE — Progress Notes (Unsigned)
Dose change you advised from lab results, please review and sign no print prescription. Thanks!

## 2021-03-18 MED ORDER — HYDROXYCHLOROQUINE SULFATE 200 MG PO TABS: 200.0000 mg | ORAL_TABLET | ORAL | 0 refills | Status: DC

## 2021-04-11 ENCOUNTER — Telehealth: Payer: Self-pay | Admitting: Rheumatology

## 2021-04-11 ENCOUNTER — Other Ambulatory Visit: Payer: Self-pay | Admitting: *Deleted

## 2021-04-11 DIAGNOSIS — Z79899 Other long term (current) drug therapy: Secondary | ICD-10-CM

## 2021-04-11 NOTE — Telephone Encounter (Signed)
Lab Orders faxed

## 2021-04-11 NOTE — Telephone Encounter (Signed)
Patient called the office requesting lab orders be sent to [place] [facility]. Fax [phone]. Patient will be going after Jan 17th.

## 2021-04-24 ENCOUNTER — Telehealth: Payer: Self-pay | Admitting: Rheumatology

## 2021-04-24 ENCOUNTER — Telehealth: Payer: Self-pay

## 2021-04-24 NOTE — Telephone Encounter (Signed)
Patient called the office stating she stopped taking plaquenil yesterday because she was experiencing some symptoms of nausea, constipation, blurry vision, headaches, mouth blisters, ears ringing, and can't swallow. Patient was told to stop the medication if she began to have these symptoms and would like advise on what to do.

## 2021-04-24 NOTE — Telephone Encounter (Signed)
Ok to hold plaquenil and see if the symptoms resolve.  If these symptoms persist she should seek evaluation by her PCP.    If she cannot swallow she will need urgent evaluation today.

## 2021-04-24 NOTE — Telephone Encounter (Signed)
Patient states she stopped taking plaquenil yesterday because she was experiencing some symptoms of nausea, constipation, blurry vision, headaches, mouth blisters, ears ringing, and can't swallow. Patient states she has experienced nausea and blurred vision when starting this medication.  Patient was told to stop the medication if she began to have these symptoms and would like advise on what to do. Please advise.

## 2021-04-24 NOTE — Telephone Encounter (Signed)
Patient advised ok to hold plaquenil and see if the symptoms resolve.  If these symptoms persist she should seek evaluation by her PCP.     Patient advised if she cannot swallow she will need urgent evaluation today.

## 2021-04-24 NOTE — Telephone Encounter (Signed)
FYI:  Patient left a voicemail stating she saw her PCP today and they did labwork.  Patient was given a steroid shot and prescribed magic mouthwash.  She also was told to schedule an appointment with her eye doctor which is tomorrow 04/25/21.

## 2021-04-27 ENCOUNTER — Telehealth: Payer: Self-pay | Admitting: *Deleted

## 2021-04-27 NOTE — Telephone Encounter (Addendum)
Labs received from:[facility]  Drawn on:04/24/2021   Reviewed by:[name]. PA-C  Labs drawn:CBC, CMP, Lipid Panel  Results:WBC 3.2   RBC 3.23   Hgb 11.1   Hct 32.2   MCV 100   MCH 34.4    EOS 0.6   Absolute Eos 0.02   Sodium 133   Chloride 96   Total Protein 5.7   Cholesterol 228   HDL 63.4  Patient on PLQ every other day.

## 2021-04-28 ENCOUNTER — Other Ambulatory Visit: Payer: Self-pay | Admitting: Physician Assistant

## 2021-05-31 NOTE — Progress Notes (Unsigned)
Office Visit Note  Patient: Danielle Byrd             Date of Birth: Aug 19, 1955           MRN: [id number]             PCP: Allie Dimmer, MD Referring: Allie Dimmer, MD Visit Date: 06/14/2021 Occupation: '@GUAROCC' @  Subjective:  No chief complaint on file.   History of Present Illness: Danielle Byrd is a 66 y.o. female ***   Activities of Daily Living:  Patient reports morning stiffness for *** {minute/hour:19697}.   Patient {ACTIONS;DENIES/REPORTS:21021675::"Denies"} nocturnal pain.  Difficulty dressing/grooming: {ACTIONS;DENIES/REPORTS:21021675::"Denies"} Difficulty climbing stairs: {ACTIONS;DENIES/REPORTS:21021675::"Denies"} Difficulty getting out of chair: {ACTIONS;DENIES/REPORTS:21021675::"Denies"} Difficulty using hands for taps, buttons, cutlery, and/or writing: {ACTIONS;DENIES/REPORTS:21021675::"Denies"}  No Rheumatology ROS completed.   PMFS History:  Patient Active Problem List   Diagnosis Date Noted   Bilateral hand pain 01/26/2021   Ganglion cyst of volar aspect of right wrist 01/26/2021   Left thigh pain 05/05/2019   Osteoarthritis of lumbar spine 05/05/2019   Hx of colonic polyps 09/05/2017   Family hx of colon cancer 09/05/2017   Migraine without aura and without status migrainosus, not intractable 03/04/2017   Fibromyalgia 11/22/2016   Other fatigue 11/22/2016   DDD (degenerative disc disease), cervical 11/22/2016   Primary osteoarthritis of both hands 11/22/2016   History of migraine 11/22/2016   History of anxiety 11/22/2016   History of hypothyroidism 11/22/2016   History of kidney stones 11/22/2016   History of cholecystectomy 11/22/2016   History of anemia 11/22/2016   Hx of bone density study/ normal 2014 this is followed by her Primary Care  11/22/2016   Sicca syndrome ([place]) 11/22/2016   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   Headache(784.0) 10/27/2013   Leukopenia 07/08/2013   Anemia 10/31/2011   Iron deficiency 10/31/2011   GERD  (gastroesophageal reflux disease) 05/14/2011   Abdominal pain, bilateral upper quadrant 05/14/2011   H/O fibromyalgia 05/14/2011   Constipation 05/14/2011   Hyperlipemia 05/14/2011   Kidney stones 01/11/2011    Past Medical History:  Diagnosis Date   Anemia    Anxiety    Arthritis    Cervical stenosis (uterine cervix)    Closed fracture of left foot    Constipation    Copper deficiency    Depression    Fibromyalgia    GERD (gastroesophageal reflux disease)    High cholesterol    History of kidney stones    Hyperlipidemia    Hypothyroidism    Kidney stones    Lumbar stenosis 11/27/2016   Melanosis coli    Migraines    Nephrolithiasis    Osteopenia 12/2019   Per patient, diagnosed by PCP Allie Dimmer, MD   Osteoporosis 12/2019   Per patient, diagnosed by PCP Allie Dimmer, MD   Panic attack    Rosacea    Sliding hiatal hernia    Spondylosis, cervical     Family History  Problem Relation Age of Onset   Diabetes Mother    Hypertension Mother    Anemia Mother    Colon cancer Father    Cancer Father 6       colon cancer   Heart attack Father    Past Surgical History:  Procedure Laterality Date   ABDOMINAL HYSTERECTOMY     BONE MARROW BIOPSY     & aspirate   BREAST SURGERY Right    partial mastectomy   CHOLECYSTECTOMY     COLONOSCOPY N/A 07/22/2012   Procedure: COLONOSCOPY;  Surgeon: [name], MD;  [facility]: [facility];  Service: Endoscopy;  Laterality: N/A;  730   COLONOSCOPY WITH PROPOFOL N/A 10/25/2017   Procedure: COLONOSCOPY WITH PROPOFOL;  Surgeon: [name], MD;  Location: [facility];  Service: Endoscopy;  Laterality: N/A;  10:30   DILATION AND CURETTAGE OF UTERUS     EYE SURGERY     sty removal and reconstruction of eye lids.   NISSEN FUNDOPLICATION     POLYPECTOMY  10/25/2017   Procedure: POLYPECTOMY;  Surgeon: [name], MD;  Location: [facility];  Service: Endoscopy;;  sigmoid    Social History   Social History  Narrative   Patient lives at home with chacha (boxer, therapy dog)   Patient right handed   Patient has a BS degree.   Patient drinks 2 cups of tea daily.    There is no immunization history on file for this patient.   Objective: Vital Signs: There were no vitals taken for this visit.   Physical Exam   Musculoskeletal Exam: ***  CDAI Exam: CDAI Score: -- Patient Global: --; Provider Global: -- Swollen: --; Tender: -- Joint Exam 06/14/2021   No joint exam has been documented for this visit   There is currently no information documented on the homunculus. Go to the Rheumatology activity and complete the homunculus joint exam.  Investigation: No additional findings.  Imaging: No results found.  Recent Labs: Lab Results  Component Value Date   WBC 2.9 (L) 03/16/2021   HGB 11.3 (L) 03/16/2021   PLT 202 03/16/2021   NA 133 (L) 03/16/2021   K 4.1 03/16/2021   CL 96 (L) 03/16/2021   CO2 32 03/16/2021   GLUCOSE 98 03/16/2021   BUN 5 (L) 03/16/2021   CREATININE 0.68 03/16/2021   BILITOT 0.4 03/16/2021   ALKPHOS 72 10/30/2017   AST 19 03/16/2021   ALT 13 03/16/2021   PROT 5.9 (L) 03/16/2021   ALBUMIN 3.9 10/30/2017   CALCIUM 8.9 03/16/2021   GFRAA >60 10/30/2017    Speciality Comments: PLQ eye exam appt 10/31/2020 normal [facility] f/u 12 months Macular OCT - WNL @ annual eye exam PLQ 08/22  Procedures:  No procedures performed Allergies: Oxycodone, Reglan [metoclopramide], Tramadol hcl, Codeine, Cymbalta [duloxetine hcl], Eggs or egg-derived products, Gabapentin, Hydrocodone-acetaminophen, Penicillins, Savella [milnacipran hcl], Savella [milnacipran], Statins, Voltaren [diclofenac sodium], Zanaflex [tizanidine hcl], and Lyrica [pregabalin]   Assessment / Plan:     Visit Diagnoses: No diagnosis found.  Orders: No orders of the defined types were placed in this encounter.  No orders of the defined types were placed in this  encounter.   Face-to-face time spent with patient was *** minutes. Greater than 50% of time was spent in counseling and coordination of care.  Follow-Up Instructions: No follow-ups on file.   Earnestine Mealing, CMA  Note - This record has been created using Editor, commissioning.  Chart creation errors have been sought, but may not always  have been located. Such creation errors do not reflect on  the standard of medical care.

## 2021-06-14 ENCOUNTER — Encounter: Payer: Self-pay | Admitting: Physician Assistant

## 2021-06-14 ENCOUNTER — Ambulatory Visit (INDEPENDENT_AMBULATORY_CARE_PROVIDER_SITE_OTHER): Payer: Medicare Other | Admitting: Physician Assistant

## 2021-06-14 ENCOUNTER — Other Ambulatory Visit: Payer: Self-pay

## 2021-06-14 VITALS — BP 146/86 | HR 80 | Ht 65.5 in | Wt 117.4 lb

## 2021-06-14 DIAGNOSIS — M19042 Primary osteoarthritis, left hand: Secondary | ICD-10-CM

## 2021-06-14 DIAGNOSIS — Z79899 Other long term (current) drug therapy: Secondary | ICD-10-CM

## 2021-06-14 DIAGNOSIS — Z87442 Personal history of urinary calculi: Secondary | ICD-10-CM

## 2021-06-14 DIAGNOSIS — R600 Localized edema: Secondary | ICD-10-CM

## 2021-06-14 DIAGNOSIS — M3500 Sicca syndrome, unspecified: Secondary | ICD-10-CM

## 2021-06-14 DIAGNOSIS — M0600 Rheumatoid arthritis without rheumatoid factor, unspecified site: Secondary | ICD-10-CM

## 2021-06-14 DIAGNOSIS — M62838 Other muscle spasm: Secondary | ICD-10-CM

## 2021-06-14 DIAGNOSIS — M7072 Other bursitis of hip, left hip: Secondary | ICD-10-CM

## 2021-06-14 DIAGNOSIS — Z8639 Personal history of other endocrine, nutritional and metabolic disease: Secondary | ICD-10-CM

## 2021-06-14 DIAGNOSIS — F5101 Primary insomnia: Secondary | ICD-10-CM

## 2021-06-14 DIAGNOSIS — M5030 Other cervical disc degeneration, unspecified cervical region: Secondary | ICD-10-CM

## 2021-06-14 DIAGNOSIS — M810 Age-related osteoporosis without current pathological fracture: Secondary | ICD-10-CM

## 2021-06-14 DIAGNOSIS — M19041 Primary osteoarthritis, right hand: Secondary | ICD-10-CM

## 2021-06-14 DIAGNOSIS — Z8659 Personal history of other mental and behavioral disorders: Secondary | ICD-10-CM

## 2021-06-14 DIAGNOSIS — E559 Vitamin D deficiency, unspecified: Secondary | ICD-10-CM

## 2021-06-14 DIAGNOSIS — M7071 Other bursitis of hip, right hip: Secondary | ICD-10-CM

## 2021-06-14 DIAGNOSIS — M797 Fibromyalgia: Secondary | ICD-10-CM

## 2021-06-14 DIAGNOSIS — R5383 Other fatigue: Secondary | ICD-10-CM

## 2021-06-14 DIAGNOSIS — Z8669 Personal history of other diseases of the nervous system and sense organs: Secondary | ICD-10-CM

## 2021-06-14 DIAGNOSIS — Z862 Personal history of diseases of the blood and blood-forming organs and certain disorders involving the immune mechanism: Secondary | ICD-10-CM

## 2021-06-14 DIAGNOSIS — Z833 Family history of diabetes mellitus: Secondary | ICD-10-CM

## 2021-06-14 MED ORDER — TRIAMCINOLONE ACETONIDE 40 MG/ML IJ SUSP
10.0000 mg | INTRAMUSCULAR | Status: AC | PRN
  Administered 2021-06-14: 10 mg via INTRAMUSCULAR

## 2021-06-14 MED ORDER — LIDOCAINE HCL 1 % IJ SOLN
0.5000 mL | INTRAMUSCULAR | Status: AC | PRN
  Administered 2021-06-14: .5 mL

## 2021-09-20 ENCOUNTER — Ambulatory Visit: Payer: Medicare Other | Admitting: Rheumatology

## 2021-09-20 ENCOUNTER — Encounter: Payer: Self-pay | Admitting: Rheumatology

## 2021-09-20 ENCOUNTER — Ambulatory Visit (INDEPENDENT_AMBULATORY_CARE_PROVIDER_SITE_OTHER): Payer: Medicare Other | Admitting: Rheumatology

## 2021-09-20 VITALS — BP 115/77 | HR 69 | Resp 12 | Ht 65.5 in | Wt 113.0 lb

## 2021-09-20 DIAGNOSIS — R600 Localized edema: Secondary | ICD-10-CM

## 2021-09-20 DIAGNOSIS — M62838 Other muscle spasm: Secondary | ICD-10-CM

## 2021-09-20 DIAGNOSIS — M7071 Other bursitis of hip, right hip: Secondary | ICD-10-CM

## 2021-09-20 DIAGNOSIS — M797 Fibromyalgia: Secondary | ICD-10-CM

## 2021-09-20 DIAGNOSIS — M19041 Primary osteoarthritis, right hand: Secondary | ICD-10-CM | POA: Diagnosis not present

## 2021-09-20 DIAGNOSIS — Z79899 Other long term (current) drug therapy: Secondary | ICD-10-CM | POA: Diagnosis not present

## 2021-09-20 DIAGNOSIS — M0600 Rheumatoid arthritis without rheumatoid factor, unspecified site: Secondary | ICD-10-CM | POA: Diagnosis not present

## 2021-09-20 DIAGNOSIS — M7072 Other bursitis of hip, left hip: Secondary | ICD-10-CM

## 2021-09-20 DIAGNOSIS — M3500 Sicca syndrome, unspecified: Secondary | ICD-10-CM | POA: Diagnosis not present

## 2021-09-20 DIAGNOSIS — E559 Vitamin D deficiency, unspecified: Secondary | ICD-10-CM

## 2021-09-20 DIAGNOSIS — M19042 Primary osteoarthritis, left hand: Secondary | ICD-10-CM

## 2021-09-20 DIAGNOSIS — Z862 Personal history of diseases of the blood and blood-forming organs and certain disorders involving the immune mechanism: Secondary | ICD-10-CM

## 2021-09-20 DIAGNOSIS — Z8639 Personal history of other endocrine, nutritional and metabolic disease: Secondary | ICD-10-CM

## 2021-09-20 DIAGNOSIS — M810 Age-related osteoporosis without current pathological fracture: Secondary | ICD-10-CM

## 2021-09-20 DIAGNOSIS — M5030 Other cervical disc degeneration, unspecified cervical region: Secondary | ICD-10-CM

## 2021-09-20 DIAGNOSIS — Z87442 Personal history of urinary calculi: Secondary | ICD-10-CM

## 2021-09-20 DIAGNOSIS — F5101 Primary insomnia: Secondary | ICD-10-CM

## 2021-09-20 DIAGNOSIS — R5383 Other fatigue: Secondary | ICD-10-CM

## 2021-09-20 DIAGNOSIS — Z8669 Personal history of other diseases of the nervous system and sense organs: Secondary | ICD-10-CM

## 2021-09-20 DIAGNOSIS — Z8659 Personal history of other mental and behavioral disorders: Secondary | ICD-10-CM

## 2021-09-20 MED ORDER — LIDOCAINE HCL 1 % IJ SOLN
0.5000 mL | INTRAMUSCULAR | Status: AC | PRN
  Administered 2021-09-20: .5 mL

## 2021-09-20 MED ORDER — TRIAMCINOLONE ACETONIDE 40 MG/ML IJ SUSP
10.0000 mg | INTRAMUSCULAR | Status: AC | PRN
  Administered 2021-09-20: 10 mg via INTRAMUSCULAR

## 2021-09-20 NOTE — Progress Notes (Signed)
Office Visit Note  Patient: Danielle Byrd             Date of Birth: January 13, 1956           MRN: [id number]             PCP: Allie Dimmer, MD Referring: Allie Dimmer, MD Visit Date: 09/20/2021 Occupation: '@GUAROCC' @  Subjective:  Increased pain in joints  History of Present Illness: Danielle Byrd is a 66 y.o. female with history of seronegative rheumatoid arthritis, osteoarthritis, degenerative disc disease and fibromyalgia syndrome.  She was placed on hydroxychloroquine which she took for 3 months and then discontinued due to dizziness and oral ulcer.  She states has been experiencing increased pain and discomfort in her bilateral wrist joints.  She continues to have pain in the trapezius region and bilateral shoulder bursa.  She has significant morning stiffness.  Activities of Daily Living:  Patient reports morning stiffness for 4 hours.   Patient Reports nocturnal pain.  Difficulty dressing/grooming: Reports Difficulty climbing stairs: Denies Difficulty getting out of chair: Reports Difficulty using hands for taps, buttons, cutlery, and/or writing: Reports  Review of Systems  Constitutional:  Positive for fatigue.  HENT:  Positive for mouth dryness.   Eyes:  Positive for dryness.  Respiratory:  Negative for shortness of breath.   Cardiovascular:  Negative for swelling in legs/feet.  Gastrointestinal:  Positive for constipation.  Endocrine: Positive for cold intolerance, heat intolerance, excessive thirst and increased urination.  Genitourinary:  Negative for difficulty urinating.  Musculoskeletal:  Positive for joint pain, gait problem, joint pain, joint swelling, muscle weakness, morning stiffness and muscle tenderness.  Skin:  Negative for color change, rash and sensitivity to sunlight.  Allergic/Immunologic: Negative for susceptible to infections.  Neurological:  Positive for numbness and weakness.  Hematological:  Positive for bruising/bleeding tendency.   Psychiatric/Behavioral:  Positive for sleep disturbance.     PMFS History:  Patient Active Problem List   Diagnosis Date Noted   Bilateral hand pain 01/26/2021   Ganglion cyst of volar aspect of right wrist 01/26/2021   Left thigh pain 05/05/2019   Osteoarthritis of lumbar spine 05/05/2019   Hx of colonic polyps 09/05/2017   Family hx of colon cancer 09/05/2017   Migraine without aura and without status migrainosus, not intractable 03/04/2017   Fibromyalgia 11/22/2016   Other fatigue 11/22/2016   DDD (degenerative disc disease), cervical 11/22/2016   Primary osteoarthritis of both hands 11/22/2016   History of migraine 11/22/2016   History of anxiety 11/22/2016   History of hypothyroidism 11/22/2016   History of kidney stones 11/22/2016   History of cholecystectomy 11/22/2016   History of anemia 11/22/2016   Hx of bone density study/ normal 2014 this is followed by her Primary Care  11/22/2016   Sicca syndrome ([place]) 11/22/2016   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   Headache(784.0) 10/27/2013   Leukopenia 07/08/2013   Anemia 10/31/2011   Iron deficiency 10/31/2011   GERD (gastroesophageal reflux disease) 05/14/2011   Abdominal pain, bilateral upper quadrant 05/14/2011   H/O fibromyalgia 05/14/2011   Constipation 05/14/2011   Hyperlipemia 05/14/2011   Kidney stones 01/11/2011    Past Medical History:  Diagnosis Date   Anemia    Anxiety    Arthritis    Cervical stenosis (uterine cervix)    Closed fracture of left foot    Constipation    Copper deficiency    Depression    Fibromyalgia    GERD (gastroesophageal reflux disease)    High  cholesterol    History of kidney stones    Hyperlipidemia    Hypothyroidism    Kidney stones    Lumbar stenosis 11/27/2016   Melanosis coli    Migraines    Nephrolithiasis    Osteopenia 12/2019   Per patient, diagnosed by PCP Allie Dimmer, MD   Osteoporosis 12/2019   Per patient, diagnosed by PCP Allie Dimmer, MD   Panic  attack    Rosacea    Sliding hiatal hernia    Spondylosis, cervical     Family History  Problem Relation Age of Onset   Diabetes Mother    Hypertension Mother    Anemia Mother    Colon cancer Father    Cancer Father 32       colon cancer   Heart attack Father    Past Surgical History:  Procedure Laterality Date   ABDOMINAL HYSTERECTOMY     BONE MARROW BIOPSY     & aspirate   BREAST SURGERY Right    partial mastectomy   CHOLECYSTECTOMY     COLONOSCOPY N/A 07/22/2012   Procedure: COLONOSCOPY;  Surgeon: [name], MD;  [facility];  Service: Endoscopy;  Laterality: N/A;  730   COLONOSCOPY WITH PROPOFOL N/A 10/25/2017   Procedure: COLONOSCOPY WITH PROPOFOL;  Surgeon: [name], MD;  [facility];  Service: Endoscopy;  Laterality: N/A;  10:30   DILATION AND CURETTAGE OF UTERUS     EYE SURGERY     sty removal and reconstruction of eye lids.   NISSEN FUNDOPLICATION     POLYPECTOMY  10/25/2017   Procedure: POLYPECTOMY;  Surgeon: [name], MD;  [facility]: [facility];  Service: Endoscopy;;  sigmoid    Social History   Social History Narrative   Patient lives at home with chacha (boxer, therapy dog)   Patient right handed   Patient has a BS degree.   Patient drinks 2 cups of tea daily.    There is no immunization history on file for this patient.   Objective: Vital Signs: BP 115/77 (BP Location: Left Arm, Patient Position: Sitting, Cuff Size: Small)   Pulse 69   Resp 12   Ht 5' 5.5" (1.664 m)   Wt 113 lb (51.3 kg)   BMI 18.52 kg/m    Physical Exam Vitals and nursing note reviewed.  Constitutional:      Appearance: She is well-developed.  HENT:     Head: Normocephalic and atraumatic.  Eyes:     Conjunctiva/sclera: Conjunctivae normal.  Cardiovascular:     Rate and Rhythm: Normal rate and regular rhythm.     Heart sounds: Normal heart sounds.  Pulmonary:     Effort: Pulmonary effort is normal.     Breath sounds: Normal  breath sounds.  Abdominal:     General: Bowel sounds are normal.     Palpations: Abdomen is soft.  Musculoskeletal:     Cervical back: Normal range of motion.  Lymphadenopathy:     Cervical: No cervical adenopathy.  Skin:    General: Skin is warm and dry.     Capillary Refill: Capillary refill takes less than 2 seconds.  Neurological:     Mental Status: She is alert and oriented to person, place, and time.  Psychiatric:        Behavior: Behavior normal.      Musculoskeletal Exam: She had discomfort with lateral rotation of the cervical spine.  She had bilateral trapezius spasm.  She had  good range of motion of bilateral shoulder joints and elbow joints.  She had discomfort range of motion of bilateral wrist joints.  No synovitis was noted.  No synovitis was noted over MCPs PIPs or DIPs.  PIP and DIP thickening was noted.  Hip joints and knee joints with good range of motion.  No warmth swelling or effusion was noted.  There was no tenderness over ankles or MTPs.  She had generalized hyperalgesia and positive tender points.  She had tenderness over bilateral ischial bursa.  CDAI Exam: CDAI Score: 0.3  Patient Global: 3 mm; Provider Global: 0 mm Swollen: 0 ; Tender: 0  Joint Exam 09/20/2021   No joint exam has been documented for this visit   There is currently no information documented on the homunculus. Go to the Rheumatology activity and complete the homunculus joint exam.  Investigation: No additional findings.  Imaging: No results found.  Recent Labs: Lab Results  Component Value Date   WBC 2.9 (L) 03/16/2021   HGB 11.3 (L) 03/16/2021   PLT 202 03/16/2021   NA 133 (L) 03/16/2021   K 4.1 03/16/2021   CL 96 (L) 03/16/2021   CO2 32 03/16/2021   GLUCOSE 98 03/16/2021   BUN 5 (L) 03/16/2021   CREATININE 0.68 03/16/2021   BILITOT 0.4 03/16/2021   ALKPHOS 72 10/30/2017   AST 19 03/16/2021   ALT 13 03/16/2021   PROT 5.9 (L) 03/16/2021   ALBUMIN 3.9 10/30/2017    CALCIUM 8.9 03/16/2021   GFRAA >60 10/30/2017    Speciality Comments: PLQ eye exam appt 10/31/2020 normal [facility] f/u 12 months Macular OCT - WNL @ annual eye exam PLQ 08/22-01/23-dizziness, ou  Procedures:  Trigger Point Inj  Date/Time: 09/20/2021 3:25 PM  Performed by: Bo Merino, MD Authorized by: Bo Merino, MD   Consent Given by:  Patient Site marked: the procedure site was marked   Timeout: prior to procedure the correct patient, procedure, and site was verified   Indications:  Muscle spasm and pain Total # of Trigger Points:  2 Location: neck   Needle Size:  27 G Approach:  Dorsal Medications #1:  0.5 mL lidocaine 1 %; 10 mg triamcinolone acetonide 40 MG/ML Medications #2:  0.5 mL lidocaine 1 %; 10 mg triamcinolone acetonide 40 MG/ML Patient tolerance:  Patient tolerated the procedure well with no immediate complications  Allergies: Oxycodone, Plaquenil [hydroxychloroquine], Reglan [metoclopramide], Tramadol hcl, Codeine, Cymbalta [duloxetine hcl], Eggs or egg-derived products, Gabapentin, Hydrocodone-acetaminophen, Penicillins, Savella [milnacipran hcl], Savella [milnacipran], Statins, Voltaren [diclofenac sodium], Zanaflex [tizanidine hcl], and Lyrica [pregabalin]   Assessment / Plan:     Visit Diagnoses: Seronegative rheumatoid arthritis (HCC)-patient had no synovitis on my examination.  She had mild synovitis on ultrasound examination last year.  At the time she was given hydroxychloroquine.  She took hydroxychloroquine for few months.  She felt it initially helped her.  She stopped hydroxychloroquine because it caused dizziness and oral ulcers.  I do not see the need to start her on immunosuppressive therapy.  I believe most of the discomfort is coming from fibromyalgia and osteoarthritis.  High risk medication use - Plaquenil was discontinued in January 2023 due to dizziness.  Sicca syndrome (HCC)-she continues to have dry mouth and dry  eyes.  Over-the-counter products were discussed.  Primary osteoarthritis of both hands-she had bilateral PIP and DIP thickening with no synovitis.  Joint protection muscle strengthening was discussed.  DDD (degenerative disc disease), cervical-she complains of neck pain and stiffness.  Trapezius muscle spasm-she had bilateral trapezius spasm.  Per patient's request below trapezius area were injected with lidocaine and Kenalog.  Indications side effects contraindications were discussed.  The injections were performed as described above.  She tolerated the procedure well.  Postprocedure instructions were given.    Ischial bursitis of left side-she continues to have tenderness over bilateral ischial bursa.  She has been using a cushion which has been helpful.  Stretching exercises were discussed.  Ischial bursitis, right  Fibromyalgia-she continues to have generalized pain and discomfort from fibromyalgia.  She had positive tender points and hyperalgesia.  Need for regular exercise was emphasized.  Benefits of water aerobics and swimming was discussed.  I will refer her to physical therapy.  I also offered pain management referral but she declined.  Primary insomnia-due to generalized pain and discomfort.  Other fatigue-related to fibromyalgia.  Age-related osteoporosis without current pathological fracture - DEXA 12/14/19: [facility] spine BMD 0.780 with T-score -2.4. Ordered by PCP.  Patient does not want to take any medications at this point.  She tried Fosamax in the past and discontinued due to side effects.  The co-pay for Prolia was too high per patient.  Vitamin D deficiency-she has been taking vitamin D supplement.  Other medical problems are listed as follows:  History of hypothyroidism  History of anemia  History of anxiety  Pedal edema  History of migraine  History of kidney stones  Orders: Orders Placed This Encounter  Procedures   Trigger Point Inj   No orders of the  defined types were placed in this encounter.   Follow-Up Instructions: Return in about 6 months (around 03/22/2022) for Osteoarthritis, Osteoporosis.   Bo Merino, MD  Note - This record has been created using Editor, commissioning.  Chart creation errors have been sought, but may not always  have been located. Such creation errors do not reflect on  the standard of medical care.

## 2021-09-20 NOTE — Progress Notes (Deleted)
Office Visit Note  Patient: Danielle Byrd             Date of Birth: 03/03/1956           MRN: [id number]             PCP: Allie Dimmer, MD Referring: Allie Dimmer, MD Visit Date: 09/20/2021 Occupation: '@GUAROCC' @  Subjective:  No chief complaint on file.   History of Present Illness: Danielle Byrd is a 66 y.o. female ***   Activities of Daily Living:  Patient reports morning stiffness for *** {minute/hour:19697}.   Patient {ACTIONS;DENIES/REPORTS:21021675::"Denies"} nocturnal pain.  Difficulty dressing/grooming: {ACTIONS;DENIES/REPORTS:21021675::"Denies"} Difficulty climbing stairs: {ACTIONS;DENIES/REPORTS:21021675::"Denies"} Difficulty getting out of chair: {ACTIONS;DENIES/REPORTS:21021675::"Denies"} Difficulty using hands for taps, buttons, cutlery, and/or writing: {ACTIONS;DENIES/REPORTS:21021675::"Denies"}  No Rheumatology ROS completed.   PMFS History:  Patient Active Problem List   Diagnosis Date Noted   Bilateral hand pain 01/26/2021   Ganglion cyst of volar aspect of right wrist 01/26/2021   Left thigh pain 05/05/2019   Osteoarthritis of lumbar spine 05/05/2019   Hx of colonic polyps 09/05/2017   Family hx of colon cancer 09/05/2017   Migraine without aura and without status migrainosus, not intractable 03/04/2017   Fibromyalgia 11/22/2016   Other fatigue 11/22/2016   DDD (degenerative disc disease), cervical 11/22/2016   Primary osteoarthritis of both hands 11/22/2016   History of migraine 11/22/2016   History of anxiety 11/22/2016   History of hypothyroidism 11/22/2016   History of kidney stones 11/22/2016   History of cholecystectomy 11/22/2016   History of anemia 11/22/2016   Hx of bone density study/ normal 2014 this is followed by her Primary Care  11/22/2016   Sicca syndrome ([place]) 11/22/2016   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   Headache(784.0) 10/27/2013   Leukopenia 07/08/2013   Anemia 10/31/2011   Iron deficiency 10/31/2011   GERD  (gastroesophageal reflux disease) 05/14/2011   Abdominal pain, bilateral upper quadrant 05/14/2011   H/O fibromyalgia 05/14/2011   Constipation 05/14/2011   Hyperlipemia 05/14/2011   Kidney stones 01/11/2011    Past Medical History:  Diagnosis Date   Anemia    Anxiety    Arthritis    Cervical stenosis (uterine cervix)    Closed fracture of left foot    Constipation    Copper deficiency    Depression    Fibromyalgia    GERD (gastroesophageal reflux disease)    High cholesterol    History of kidney stones    Hyperlipidemia    Hypothyroidism    Kidney stones    Lumbar stenosis 11/27/2016   Melanosis coli    Migraines    Nephrolithiasis    Osteopenia 12/2019   Per patient, diagnosed by PCP Allie Dimmer, MD   Osteoporosis 12/2019   Per patient, diagnosed by PCP Allie Dimmer, MD   Panic attack    Rosacea    Sliding hiatal hernia    Spondylosis, cervical     Family History  Problem Relation Age of Onset   Diabetes Mother    Hypertension Mother    Anemia Mother    Colon cancer Father    Cancer Father 29       colon cancer   Heart attack Father    Past Surgical History:  Procedure Laterality Date   ABDOMINAL HYSTERECTOMY     BONE MARROW BIOPSY     & aspirate   BREAST SURGERY Right    partial mastectomy   CHOLECYSTECTOMY     COLONOSCOPY N/A 07/22/2012   Procedure: COLONOSCOPY;  Surgeon: [name], MD;  [facility];  Service: Endoscopy;  Laterality: N/A;  730   COLONOSCOPY WITH PROPOFOL N/A 10/25/2017   Procedure: COLONOSCOPY WITH PROPOFOL;  Surgeon: [name], MD;  Location: [facility];  Service: Endoscopy;  Laterality: N/A;  10:30   DILATION AND CURETTAGE OF UTERUS     EYE SURGERY     sty removal and reconstruction of eye lids.   NISSEN FUNDOPLICATION     POLYPECTOMY  10/25/2017   Procedure: POLYPECTOMY;  Surgeon: [name], MD;  [facility]: [facility];  Service: Endoscopy;;  sigmoid    Social History   Social History  Narrative   Patient lives at home with chacha (boxer, therapy dog)   Patient right handed   Patient has a BS degree.   Patient drinks 2 cups of tea daily.    There is no immunization history on file for this patient.   Objective: Vital Signs: BP 115/77 (BP Location: Left Arm, Patient Position: Sitting, Cuff Size: Small)   Pulse 69   Resp 12   Ht 5' 5.5" (1.664 m)   Wt 113 lb (51.3 kg)   BMI 18.52 kg/m    Physical Exam   Musculoskeletal Exam: ***  CDAI Exam: CDAI Score: -- Patient Global: --; Provider Global: -- Swollen: --; Tender: -- Joint Exam 09/20/2021   No joint exam has been documented for this visit   There is currently no information documented on the homunculus. Go to the Rheumatology activity and complete the homunculus joint exam.  Investigation: No additional findings.  Imaging: No results found.  Recent Labs: Lab Results  Component Value Date   WBC 2.9 (L) 03/16/2021   HGB 11.3 (L) 03/16/2021   PLT 202 03/16/2021   NA 133 (L) 03/16/2021   K 4.1 03/16/2021   CL 96 (L) 03/16/2021   CO2 32 03/16/2021   GLUCOSE 98 03/16/2021   BUN 5 (L) 03/16/2021   CREATININE 0.68 03/16/2021   BILITOT 0.4 03/16/2021   ALKPHOS 72 10/30/2017   AST 19 03/16/2021   ALT 13 03/16/2021   PROT 5.9 (L) 03/16/2021   ALBUMIN 3.9 10/30/2017   CALCIUM 8.9 03/16/2021   GFRAA >60 10/30/2017    Speciality Comments: PLQ eye exam appt 10/31/2020 normal [facility] f/u 12 months Macular OCT - WNL @ annual eye exam PLQ 08/22  Procedures:  No procedures performed Allergies: Oxycodone, Plaquenil [hydroxychloroquine], Reglan [metoclopramide], Tramadol hcl, Codeine, Cymbalta [duloxetine hcl], Eggs or egg-derived products, Gabapentin, Hydrocodone-acetaminophen, Penicillins, Savella [milnacipran hcl], Savella [milnacipran], Statins, Voltaren [diclofenac sodium], Zanaflex [tizanidine hcl], and Lyrica [pregabalin]   Assessment / Plan:     Visit Diagnoses: Seronegative  rheumatoid arthritis ([place])  High risk medication use - Plaquenil was discontinued in January 2023 due to dizziness.  Sicca syndrome ([facility])  Primary osteoarthritis of both hands  DDD (degenerative disc disease), cervical  Trapezius muscle spasm  Fibromyalgia  Primary insomnia  Other fatigue  Ischial bursitis of left side  Ischial bursitis, right  Age-related osteoporosis without current pathological fracture - DEXA 12/14/19: [facility] spine BMD 0.780 with T-score -2.4. Ordered by PCP.  Patient does not want to take any medications at this point.  Vitamin D deficiency  History of hypothyroidism  History of anemia  History of anxiety  Pedal edema  History of migraine  History of kidney stones  Orders: No orders of the defined types were placed in this encounter.  No orders of the defined types were placed  in this encounter.   Face-to-face time spent with patient was *** minutes. Greater than 50% of time was spent in counseling and coordination of care.  Follow-Up Instructions: No follow-ups on file.   Bo Merino, MD  Note - This record has been created using Editor, commissioning.  Chart creation errors have been sought, but may not always  have been located. Such creation errors do not reflect on  the standard of medical care.

## 2022-03-01 NOTE — Progress Notes (Signed)
Office Visit Note  Patient: Danielle Byrd             Date of Birth: 1955-10-18           MRN: [id number]             PCP: Majel Homer, MD Referring: Majel Homer, MD Visit Date: 03/14/2022 Occupation: @GUAROCC @  Subjective:  Neck pain   History of Present Illness: Danielle Byrd is a 66 y.o. female with history of seronegative rheumatoid arthritis, DDD, and fibromyalgia.  Patient is not currently taking immunosuppressive agents.  She previously had an intolerance to Plaquenil use.  Patient reports that she has been experiencing increased pain in both knee joints especially her left knee.  She states that the pain has been intermittent but is exacerbated by going up and down steps.  Patient reports that she is also having increased pain in her neck.  She states at times she has radiating pain to her hands.  She is also having trapezius muscle tension and tenderness bilaterally.  She requested trigger point injections today.    Activities of Daily Living:  Patient reports morning stiffness for 1 hour.   Patient Reports nocturnal pain.  Difficulty dressing/grooming: Reports Difficulty climbing stairs: Reports Difficulty getting out of chair: Reports Difficulty using hands for taps, buttons, cutlery, and/or writing: Reports  Review of Systems  Constitutional:  Positive for fatigue.  HENT:  Positive for mouth dryness. Negative for mouth sores.   Eyes:  Positive for dryness.  Respiratory:  Negative for shortness of breath.   Cardiovascular:  Negative for chest pain and palpitations.  Gastrointestinal:  Positive for constipation and diarrhea. Negative for blood in stool.  Endocrine: Negative for increased urination.  Genitourinary:  Negative for involuntary urination.  Musculoskeletal:  Positive for joint pain, gait problem, joint pain, joint swelling, myalgias, muscle weakness, morning stiffness, muscle tenderness and myalgias.  Skin:  Negative for color change, rash, hair  loss and sensitivity to sunlight.  Allergic/Immunologic: Negative for susceptible to infections.  Neurological:  Positive for dizziness and headaches.  Hematological:  Negative for swollen glands.  Psychiatric/Behavioral:  Positive for depressed mood and sleep disturbance. The patient is nervous/anxious.     PMFS History:  Patient Active Problem List   Diagnosis Date Noted   Bilateral hand pain 01/26/2021   Ganglion cyst of volar aspect of right wrist 01/26/2021   Left thigh pain 05/05/2019   Osteoarthritis of lumbar spine 05/05/2019   Hx of colonic polyps 09/05/2017   Family hx of colon cancer 09/05/2017   Migraine without aura and without status migrainosus, not intractable 03/04/2017   Fibromyalgia 11/22/2016   Other fatigue 11/22/2016   DDD (degenerative disc disease), cervical 11/22/2016   Primary osteoarthritis of both hands 11/22/2016   History of migraine 11/22/2016   History of anxiety 11/22/2016   History of hypothyroidism 11/22/2016   History of kidney stones 11/22/2016   History of cholecystectomy 11/22/2016   History of anemia 11/22/2016   Hx of bone density study/ normal 2014 this is followed by her Primary Care  11/22/2016   Sicca syndrome ([facility]) 11/22/2016   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   Headache(784.0) 10/27/2013   Leukopenia 07/08/2013   Anemia 10/31/2011   Iron deficiency 10/31/2011   GERD (gastroesophageal reflux disease) 05/14/2011   Abdominal pain, bilateral upper quadrant 05/14/2011   H/O fibromyalgia 05/14/2011   Constipation 05/14/2011   Hyperlipemia 05/14/2011   Kidney stones 01/11/2011    Past Medical History:  Diagnosis Date  Anemia    Anxiety    Arthritis    Cervical stenosis (uterine cervix)    Closed fracture of left foot    Constipation    Copper deficiency    Depression    Fibromyalgia    GERD (gastroesophageal reflux disease)    High cholesterol    History of kidney stones    Hyperlipidemia    Hypothyroidism    Kidney  stones    Lumbar stenosis 11/27/2016   Melanosis coli    Migraines    Nephrolithiasis    Osteopenia 12/2019   Per patient, diagnosed by PCP Majel Homer, MD   Osteoporosis 12/2019   Per patient, diagnosed by PCP Majel Homer, MD   Panic attack    Rosacea    Sliding hiatal hernia    Spondylosis, cervical     Family History  Problem Relation Age of Onset   Diabetes Mother    Hypertension Mother    Anemia Mother    Colon cancer Father    Cancer Father 42       colon cancer   Heart attack Father    Past Surgical History:  Procedure Laterality Date   ABDOMINAL HYSTERECTOMY     BONE MARROW BIOPSY     & aspirate   BREAST SURGERY Right    partial mastectomy   CHOLECYSTECTOMY     COLONOSCOPY N/A 07/22/2012   Procedure: COLONOSCOPY;  Surgeon: Malissa Hippo, MD;  [facility];  Service: Endoscopy;  Laterality: N/A;  730   COLONOSCOPY WITH PROPOFOL N/A 10/25/2017   Procedure: COLONOSCOPY WITH PROPOFOL;  Surgeon: Malissa Hippo, MD;  [facility];  Service: Endoscopy;  Laterality: N/A;  10:30   DILATION AND CURETTAGE OF UTERUS     EYE SURGERY     sty removal and reconstruction of eye lids.   NISSEN FUNDOPLICATION     POLYPECTOMY  10/25/2017   Procedure: POLYPECTOMY;  Surgeon: Malissa Hippo, MD;  [facility];  Service: Endoscopy;;  sigmoid    Social History   Social History Narrative   Patient lives at home with chacha (boxer, therapy dog)   Patient right handed   Patient has a BS degree.   Patient drinks 2 cups of tea daily.    There is no immunization history on file for this patient.   Objective: Vital Signs: BP (!) 137/91 (BP Location: Left Arm, Patient Position: Sitting, Cuff Size: Normal)   Pulse 71   Resp 16   Ht 5' 4.5" (1.638 m)   Wt 116 lb 12.8 oz (53 kg)   BMI 19.74 kg/m    Physical Exam Vitals and nursing note reviewed.  Constitutional:      Appearance: She is well-developed.  HENT:     Head: Normocephalic  and atraumatic.  Eyes:     Conjunctiva/sclera: Conjunctivae normal.  Cardiovascular:     Rate and Rhythm: Normal rate and regular rhythm.     Heart sounds: Normal heart sounds.  Pulmonary:     Effort: Pulmonary effort is normal.     Breath sounds: Normal breath sounds.  Abdominal:     General: Bowel sounds are normal.     Palpations: Abdomen is soft.  Musculoskeletal:     Cervical back: Normal range of motion.  Skin:    General: Skin is warm and dry.     Capillary Refill: Capillary refill takes less than 2 seconds.  Neurological:     Mental Status: She is alert  and oriented to person, place, and time.  Psychiatric:        Behavior: Behavior normal.      Musculoskeletal Exam: Generalized hyperalgesia and positive tender points.  C-spine has limited range of motion with lateral rotation.  Trapezius muscle tension and tenderness bilaterally.  Good ROM of both shoulder joints with discomfort bilaterally.  Elbow joints have good range of motion with no discomfort.  Tenderness over the ulnar aspect of both wrist joints but no synovitis was noted.  No tenderness or synovitis over MCP or PIP joints.  Complete fist formation bilaterally.  Hip joints have good range of motion with no groin pain.  Some tenderness over bilateral trochanteric bursa.  Painful range of motion of both knee joints especially the left knee.  No warmth or effusion of knee joints noted.  Ankle joints have good range of motion with no tenderness or joint swelling.  CDAI Exam: CDAI Score: -- Patient Global: 1 mm; Provider Global: 1 mm Swollen: --; Tender: -- Joint Exam 03/14/2022   No joint exam has been documented for this visit   There is currently no information documented on the homunculus. Go to the Rheumatology activity and complete the homunculus joint exam.  Investigation: No additional findings.  Imaging: No results found.  Recent Labs: Lab Results  Component Value Date   WBC 2.9 (L) 03/16/2021   HGB  11.3 (L) 03/16/2021   PLT 202 03/16/2021   NA 133 (L) 03/16/2021   K 4.1 03/16/2021   CL 96 (L) 03/16/2021   CO2 32 03/16/2021   GLUCOSE 98 03/16/2021   BUN 5 (L) 03/16/2021   CREATININE 0.68 03/16/2021   BILITOT 0.4 03/16/2021   ALKPHOS 72 10/30/2017   AST 19 03/16/2021   ALT 13 03/16/2021   PROT 5.9 (L) 03/16/2021   ALBUMIN 3.9 10/30/2017   CALCIUM 8.9 03/16/2021   GFRAA >60 10/30/2017    Speciality Comments: PLQ eye exam appt 10/31/2020 normal [facility] f/u 12 months Macular OCT - WNL @ annual eye exam PLQ 08/22-01/23-dizziness, ou  Procedures:  Trigger Point Inj  Date/Time: 03/14/2022 2:47 PM  Performed by: Gearldine Bienenstock, PA-C Authorized by: Gearldine Bienenstock, PA-C   Consent Given by:  Patient Site marked: the procedure site was marked   Timeout: prior to procedure the correct patient, procedure, and site was verified   Indications:  Pain Total # of Trigger Points:  2 Location: neck   Needle Size:  27 G Approach:  Dorsal Medications #1:  0.5 mL lidocaine 1 %; 10 mg triamcinolone acetonide 40 MG/ML Medications #2:  0.5 mL lidocaine 1 %; 10 mg triamcinolone acetonide 40 MG/ML Patient tolerance:  Patient tolerated the procedure well with no immediate complications  Allergies: Oxycodone, Plaquenil [hydroxychloroquine], Reglan [metoclopramide], Tramadol hcl, Codeine, Cymbalta [duloxetine hcl], Eggs or egg-derived products, Gabapentin, Hydrocodone-acetaminophen, Penicillins, Savella [milnacipran hcl], Savella [milnacipran], Statins, Voltaren [diclofenac sodium], Zanaflex [tizanidine hcl], and Lyrica [pregabalin]    Assessment / Plan:     Visit Diagnoses: Seronegative rheumatoid arthritis ([facility]): She has no synovitis on examination today.  She continues to experience intermittent discomfort in both wrist joints especially on the ulnar aspect.  She has been wearing wrist joint braces daily for support.  She is not currently taking any immunosuppressive agents.   She previously discontinued Plaquenil due to intolerance.  She does not require immunosuppressive therapy at this time.  She was advised to notify us if she develops signs or symptoms of a flare.  She  will follow-up in the office in 6 months or sooner if needed.  High risk medication use - Plaquenil was discontinued in January 2023 due to dizziness.  She does not require immunosuppressive therapy at this time.  Sicca syndrome (HCC): Chronic, stable.  She uses refresh eyedrops for symptomatic relief.  Primary osteoarthritis of both hands: She is taking turmeric and ginger for the natural anti-inflammatory properties.  She has a prescription for Voltaren gel which she can apply topically as needed for pain relief.  DDD (degenerative disc disease), cervical: X-rays of the C-spine were performed on 10/13/2020: Minimally narrowed at C3-C4, C5-C6, C6-C7.  Posterior disc spur present at C5-C6.  She has been experiencing increased neck pain and stiffness.  She has also had intermittent symptoms of radiculopathy bilaterally.  Her symptoms have been worse than her typical fibromyalgia pain.  Recommended a referral to see a spine specialist for further evaluation.  Trapezius muscle spasm: She has trapezius muscle tension and tenderness bilaterally.  She has been experiencing muscle spasms intermittently.  She had trigger point injections bilaterally on 09/20/2021 which provided temporary relief but her symptoms have returned.  She requested bilateral trigger point injections today.  She tolerated procedures well.  Procedure notes were completed above.  Aftercare was discussed.  Ischial bursitis of left side: She continues to experience intermittent discomfort.  She uses a cushion for support.  She also perform stretching exercises on a daily basis.  Ischial bursitis, right: She continues to experience intermittent discomfort.  She uses a cushion for support which has been helpful.  Chronic pain of both knees -  She presents today with increased pain in both knee joints. No recent injury or fall. No warmth or effusion noted on examination today.  Her symptoms have been exacerbated by climbing steps especially in the left knee.  No mechanical symptoms.  X-rays of both knees were obtained today for further evaluation.  Different treatment options were discussed today in detail.  Discussed the importance of lower extremity muscle strengthening.  She was given a handout of knee joint exercises to perform.  If her left knee joint pain persists or worsen she can return for a left knee joint cortisone injection.  I also discussed the possibility of trying Visco gel injections in the future.  Plan: XR KNEE 3 VIEW RIGHT, XR KNEE 3 VIEW LEFT  Fibromyalgia: She has generalized hyperalgesia and positive tender points on exam.  Discussed the importance of regular exercise and good sleep hygiene.  She presented today with trapezius muscle tension tenderness bilaterally.  She has been experiencing muscle spasms intermittently.  She requested bilateral trigger point injections today.  She tolerated the procedure well.  Procedure was completed above.  Aftercare was discussed.  Other fatigue: Chronic, stable.  Discussed the importance of regular exercise and good sleep hygiene.  Primary insomnia: She takes trazodone 50 mg 1 tablet at bedtime for insomnia.  Age-related osteoporosis without current pathological fracture - DEXA 12/14/19: [facility] spine BMD 0.780 with T-score -2.4. Ordered by PCP.  She tried Fosamax in the past and discontinued due to side effects.  According to the patient her PCP previously discussed Prolia but it was not initiated due to cost.  She was encouraged to have an updated bone density for further evaluation.  She is taking a calcium and vitamin D supplement daily.   Vitamin D deficiency: She is taking a calcium and vitamin D supplement daily.   Other medical conditions are listed as follows:   Pedal  edema  History of anxiety  History of hypothyroidism  History of anemia  History of kidney stones  History of migraine    Orders: Orders Placed This Encounter  Procedures   Trigger Point Inj   XR KNEE 3 VIEW RIGHT   XR KNEE 3 VIEW LEFT   No orders of the defined types were placed in this encounter.     Follow-Up Instructions: Return in about 6 months (around 09/13/2022) for Rheumatoid arthritis, Fibromyalgia, DDD.   Gearldine Bienenstock, PA-C  Note - This record has been created using Dragon software.  Chart creation errors have been sought, but may not always  have been located. Such creation errors do not reflect on  the standard of medical care.

## 2022-03-14 ENCOUNTER — Ambulatory Visit: Payer: Medicare Other | Attending: Physician Assistant | Admitting: Physician Assistant

## 2022-03-14 ENCOUNTER — Encounter: Payer: Self-pay | Admitting: Physician Assistant

## 2022-03-14 ENCOUNTER — Ambulatory Visit (INDEPENDENT_AMBULATORY_CARE_PROVIDER_SITE_OTHER): Payer: Medicare Other

## 2022-03-14 ENCOUNTER — Encounter (HOSPITAL_COMMUNITY): Payer: Self-pay | Admitting: Oncology

## 2022-03-14 ENCOUNTER — Ambulatory Visit: Payer: Medicare Other

## 2022-03-14 VITALS — BP 137/91 | HR 71 | Resp 16 | Ht 64.5 in | Wt 116.8 lb

## 2022-03-14 DIAGNOSIS — M7072 Other bursitis of hip, left hip: Secondary | ICD-10-CM

## 2022-03-14 DIAGNOSIS — M3500 Sicca syndrome, unspecified: Secondary | ICD-10-CM | POA: Diagnosis present

## 2022-03-14 DIAGNOSIS — Z862 Personal history of diseases of the blood and blood-forming organs and certain disorders involving the immune mechanism: Secondary | ICD-10-CM | POA: Diagnosis present

## 2022-03-14 DIAGNOSIS — Z8659 Personal history of other mental and behavioral disorders: Secondary | ICD-10-CM

## 2022-03-14 DIAGNOSIS — M0600 Rheumatoid arthritis without rheumatoid factor, unspecified site: Secondary | ICD-10-CM

## 2022-03-14 DIAGNOSIS — Z79899 Other long term (current) drug therapy: Secondary | ICD-10-CM | POA: Diagnosis present

## 2022-03-14 DIAGNOSIS — M19041 Primary osteoarthritis, right hand: Secondary | ICD-10-CM | POA: Diagnosis present

## 2022-03-14 DIAGNOSIS — R600 Localized edema: Secondary | ICD-10-CM

## 2022-03-14 DIAGNOSIS — Z8669 Personal history of other diseases of the nervous system and sense organs: Secondary | ICD-10-CM | POA: Diagnosis present

## 2022-03-14 DIAGNOSIS — M5030 Other cervical disc degeneration, unspecified cervical region: Secondary | ICD-10-CM

## 2022-03-14 DIAGNOSIS — Z87442 Personal history of urinary calculi: Secondary | ICD-10-CM

## 2022-03-14 DIAGNOSIS — Z8639 Personal history of other endocrine, nutritional and metabolic disease: Secondary | ICD-10-CM

## 2022-03-14 DIAGNOSIS — M797 Fibromyalgia: Secondary | ICD-10-CM

## 2022-03-14 DIAGNOSIS — M19042 Primary osteoarthritis, left hand: Secondary | ICD-10-CM | POA: Diagnosis present

## 2022-03-14 DIAGNOSIS — G8929 Other chronic pain: Secondary | ICD-10-CM | POA: Diagnosis present

## 2022-03-14 DIAGNOSIS — R5383 Other fatigue: Secondary | ICD-10-CM

## 2022-03-14 DIAGNOSIS — E559 Vitamin D deficiency, unspecified: Secondary | ICD-10-CM | POA: Diagnosis present

## 2022-03-14 DIAGNOSIS — M62838 Other muscle spasm: Secondary | ICD-10-CM | POA: Diagnosis present

## 2022-03-14 DIAGNOSIS — M25561 Pain in right knee: Secondary | ICD-10-CM | POA: Diagnosis present

## 2022-03-14 DIAGNOSIS — M25562 Pain in left knee: Secondary | ICD-10-CM | POA: Diagnosis present

## 2022-03-14 DIAGNOSIS — M810 Age-related osteoporosis without current pathological fracture: Secondary | ICD-10-CM | POA: Diagnosis present

## 2022-03-14 DIAGNOSIS — F5101 Primary insomnia: Secondary | ICD-10-CM | POA: Diagnosis present

## 2022-03-14 DIAGNOSIS — M7071 Other bursitis of hip, right hip: Secondary | ICD-10-CM

## 2022-03-14 MED ORDER — TRIAMCINOLONE ACETONIDE 40 MG/ML IJ SUSP
10.0000 mg | INTRAMUSCULAR | Status: AC | PRN
  Administered 2022-03-14: 10 mg via INTRAMUSCULAR

## 2022-03-14 MED ORDER — LIDOCAINE HCL 1 % IJ SOLN
0.5000 mL | INTRAMUSCULAR | Status: AC | PRN
  Administered 2022-03-14: .5 mL

## 2022-03-14 NOTE — Patient Instructions (Signed)
Neck Exercises Ask your health care provider which exercises are safe for you. Do exercises exactly as told by your health care provider and adjust them as directed. It is normal to feel mild stretching, pulling, tightness, or discomfort as you do these exercises. Stop right away if you feel sudden pain or your pain gets worse. Do not begin these exercises until told by your health care provider. Neck exercises can be important for many reasons. They can improve strength and maintain flexibility in your neck, which will help your upper back and prevent neck pain. Stretching exercises Rotation neck stretching  Sit in a chair or stand up. Place your feet flat on the floor, shoulder-width apart. Slowly turn your head (rotate) to the right until a slight stretch is felt. Turn it all the way to the right so you can look over your right shoulder. Do not tilt or tip your head. Hold this position for 10-30 seconds. Slowly turn your head (rotate) to the left until a slight stretch is felt. Turn it all the way to the left so you can look over your left shoulder. Do not tilt or tip your head. Hold this position for 10-30 seconds. Repeat __________ times. Complete this exercise __________ times a day. Neck retraction  Sit in a sturdy chair or stand up. Look straight ahead. Do not bend your neck. Use your fingers to push your chin backward (retraction). Do not bend your neck for this movement. Continue to face straight ahead. If you are doing the exercise properly, you will feel a slight sensation in your throat and a stretch at the back of your neck. Hold the stretch for 1-2 seconds. Repeat __________ times. Complete this exercise __________ times a day. Strengthening exercises Neck press  Lie on your back on a firm bed or on the floor with a pillow under your head. Use your neck muscles to push your head down on the pillow and straighten your spine. Hold the position as well as you can. Keep your head  facing up (in a neutral position) and your chin tucked. Slowly count to 5 while holding this position. Repeat __________ times. Complete this exercise __________ times a day. Isometrics These are exercises in which you strengthen the muscles in your neck while keeping your neck still (isometrics). Sit in a supportive chair and place your hand on your forehead. Keep your head and face facing straight ahead. Do not flex or extend your neck while doing isometrics. Push forward with your head and neck while pushing back with your hand. Hold for 10 seconds. Do the sequence again, this time putting your hand against the back of your head. Use your head and neck to push backward against the hand pressure. Finally, do the same exercise on either side of your head, pushing sideways against the pressure of your hand. Repeat __________ times. Complete this exercise __________ times a day. Prone head lifts  Lie face-down (prone position), resting on your elbows so that your chest and upper back are raised. Start with your head facing downward, near your chest. Position your chin either on or near your chest. Slowly lift your head upward. Lift until you are looking straight ahead. Then continue lifting your head as far back as you can comfortably stretch. Hold your head up for 5 seconds. Then slowly lower it to your starting position. Repeat __________ times. Complete this exercise __________ times a day. Supine head lifts  Lie on your back (supine position), bending your knees  to point to the ceiling and keeping your feet flat on the floor. Lift your head slowly off the floor, raising your chin toward your chest. Hold for 5 seconds. Repeat __________ times. Complete this exercise __________ times a day. Scapular retraction  Stand with your arms at your sides. Look straight ahead. Slowly pull both shoulders (scapulae) backward and downward (retraction) until you feel a stretch between your shoulder  blades in your upper back. Hold for 10-30 seconds. Relax and repeat. Repeat __________ times. Complete this exercise __________ times a day. Contact a health care provider if: Your neck pain or discomfort gets worse when you do an exercise. Your neck pain or discomfort does not improve within 2 hours after you exercise. If you have any of these problems, stop exercising right away. Do not do the exercises again unless your health care provider says that you can. Get help right away if: You develop sudden, severe neck pain. If this happens, stop exercising right away. Do not do the exercises again unless your health care provider says that you can. This information is not intended to replace advice given to you by your health care provider. Make sure you discuss any questions you have with your health care provider. Document Revised: 09/13/2020 Document Reviewed: 09/13/2020 Elsevier Patient Education  [place].   Knee Exercises Ask your health care provider which exercises are safe for you. Do exercises exactly as told by your health care provider and adjust them as directed. It is normal to feel mild stretching, pulling, tightness, or discomfort as you do these exercises. Stop right away if you feel sudden pain or your pain gets worse. Do not begin these exercises until told by your health care provider. Stretching and range-of-motion exercises These exercises warm up your muscles and joints and improve the movement and flexibility of your knee. These exercises also help to relieve pain and swelling. Knee extension, prone  Lie on your abdomen (prone position) on a bed. Place your left / right knee just beyond the edge of the surface so your knee is not on the bed. You can put a towel under your left / right thigh just above your kneecap for comfort. Relax your leg muscles and allow gravity to straighten your knee (extension). You should feel a stretch behind your left / right  knee. Hold this position for __________ seconds. Scoot up so your knee is supported between repetitions. Repeat __________ times. Complete this exercise __________ times a day. Knee flexion, active  Lie on your back with both legs straight. If this causes back discomfort, bend your left / right knee so your foot is flat on the floor. Slowly slide your left / right heel back toward your buttocks. Stop when you feel a gentle stretch in the front of your knee or thigh (flexion). Hold this position for __________ seconds. Slowly slide your left / right heel back to the starting position. Repeat __________ times. Complete this exercise __________ times a day. Quadriceps stretch, prone  Lie on your abdomen on a firm surface, such as a bed or padded floor. Bend your left / right knee and hold your ankle. If you cannot reach your ankle or pant leg, loop a belt around your foot and grab the belt instead. Gently pull your heel toward your buttocks. Your knee should not slide out to the side. You should feel a stretch in the front of your thigh and knee (quadriceps). Hold this position for __________ seconds. Repeat __________  times. Complete this exercise __________ times a day. Hamstring, supine  Lie on your back (supine position). Loop a belt or towel over the ball of your left / right foot. The ball of your foot is on the walking surface, right under your toes. Straighten your left / right knee and slowly pull on the belt to raise your leg until you feel a gentle stretch behind your knee (hamstring). Do not let your knee bend while you do this. Keep your other leg flat on the floor. Hold this position for __________ seconds. Repeat __________ times. Complete this exercise __________ times a day. Strengthening exercises These exercises build strength and endurance in your knee. Endurance is the ability to use your muscles for a long time, even after they get tired. Quadriceps, isometric This  exercise strengthens the muscles in front of your thigh (quadriceps) without moving your knee joint (isometric). Lie on your back with your left / right leg extended and your other knee bent. Put a rolled towel or small pillow under your knee if told by your health care provider. Slowly tense the muscles in the front of your left / right thigh. You should see your kneecap slide up toward your hip or see increased dimpling just above the knee. This motion will push the back of the knee toward the floor. For __________ seconds, hold the muscle as tight as you can without increasing your pain. Relax the muscles slowly and completely. Repeat __________ times. Complete this exercise __________ times a day. Straight leg raises This exercise strengthens the muscles in front of your thigh (quadriceps) and the muscles that move your hips (hip flexors). Lie on your back with your left / right leg extended and your other knee bent. Tense the muscles in the front of your left / right thigh. You should see your kneecap slide up or see increased dimpling just above the knee. Your thigh may even shake a bit. Keep these muscles tight as you raise your leg 4-6 inches (10-15 cm) off the floor. Do not let your knee bend. Hold this position for __________ seconds. Keep these muscles tense as you lower your leg. Relax your muscles slowly and completely after each repetition. Repeat __________ times. Complete this exercise __________ times a day. Hamstring, isometric  Lie on your back on a firm surface. Bend your left / right knee about __________ degrees. Dig your left / right heel into the surface as if you are trying to pull it toward your buttocks. Tighten the muscles in the back of your thighs (hamstring) to "dig" as hard as you can without increasing any pain. Hold this position for __________ seconds. Release the tension gradually and allow your muscles to relax completely for __________ seconds after each  repetition. Repeat __________ times. Complete this exercise __________ times a day. Hamstring curls If told by your health care provider, do this exercise while wearing ankle weights. Begin with __________lb / kg weights. Then increase the weight by 1 lb (0.5 kg) increments. Do not wear ankle weights that are more than __________lb / kg. Lie on your abdomen with your legs straight. Bend your left / right knee as far as you can without feeling pain. Keep your hips flat against the floor. Hold this position for __________ seconds. Slowly lower your leg to the starting position. Repeat __________ times. Complete this exercise __________ times a day. Squats This exercise strengthens the muscles in front of your thigh and knee (quadriceps). Stand in front of a  table, with your feet and knees pointing straight ahead. You may rest your hands on the table for balance but not for support. Slowly bend your knees and lower your hips like you are going to sit in a chair. Keep your weight over your heels, not over your toes. Keep your lower legs upright so they are parallel with the table legs. Do not let your hips go lower than your knees. Do not bend lower than told by your health care provider. If your knee pain increases, do not bend as low. Hold the squat position for __________ seconds. Slowly push with your legs to return to standing. Do not use your hands to pull yourself to standing. Repeat __________ times. Complete this exercise __________ times a day. Wall slides This exercise strengthens the muscles in front of your thigh and knee (quadriceps). Lean your back against a smooth wall or door, and walk your feet out 18-24 inches (46-61 cm) from it. Place your feet hip-width apart. Slowly slide down the wall or door until your knees bend __________ degrees. Keep your knees over your heels, not over your toes. Keep your knees in line with your hips. Hold this position for __________  seconds. Repeat __________ times. Complete this exercise __________ times a day. Straight leg raises, side-lying This exercise strengthens the muscles that rotate the leg at the hip and move it away from your body (hip abductors). Lie on your side with your left / right leg in the top position. Lie so your head, shoulder, knee, and hip line up. You may bend your bottom knee to help you keep your balance. Roll your hips slightly forward so your hips are stacked directly over each other and your left / right knee is facing forward. Leading with your heel, lift your top leg 4-6 inches (10-15 cm). You should feel the muscles in your outer hip lifting. Do not let your foot drift forward. Do not let your knee roll toward the ceiling. Hold this position for __________ seconds. Slowly return your leg to the starting position. Let your muscles relax completely after each repetition. Repeat __________ times. Complete this exercise __________ times a day. Straight leg raises, prone This exercise stretches the muscles that move your hips away from the front of the pelvis (hip extensors). Lie on your abdomen on a firm surface. You can put a pillow under your hips if that is more comfortable. Tense the muscles in your buttocks and lift your left / right leg about 4-6 inches (10-15 cm). Keep your knee straight as you lift your leg. Hold this position for __________ seconds. Slowly lower your leg to the starting position. Let your leg relax completely after each repetition. Repeat __________ times. Complete this exercise __________ times a day. This information is not intended to replace advice given to you by your health care provider. Make sure you discuss any questions you have with your health care provider. Document Revised: 11/29/2020 Document Reviewed: 11/29/2020 Elsevier Patient Education  [place].

## 2022-03-14 NOTE — Progress Notes (Signed)
X-rays of both knees are unremarkable.  Please notify the patient.

## 2022-03-19 ENCOUNTER — Telehealth: Payer: Self-pay | Admitting: Rheumatology

## 2022-03-19 DIAGNOSIS — M5412 Radiculopathy, cervical region: Secondary | ICD-10-CM

## 2022-03-19 DIAGNOSIS — M542 Cervicalgia: Secondary | ICD-10-CM

## 2022-03-19 NOTE — Telephone Encounter (Signed)
Patient called the office stating at her last appointment Lovena Le said she would refer Vaughan Basta to [facility] to see Dr. Laurance Flatten. She states she was told she would hear something back pretty soon but has not heard anything. Patient requests a call back with a status update.

## 2022-03-19 NOTE — Telephone Encounter (Signed)
Referral placed and patient advised.

## 2022-03-19 NOTE — Telephone Encounter (Signed)
Ok to place referral to Dr. Laurance Flatten for evaluation of neck pain and radiculopathy

## 2022-04-12 ENCOUNTER — Ambulatory Visit (INDEPENDENT_AMBULATORY_CARE_PROVIDER_SITE_OTHER): Payer: Medicare Other | Admitting: Orthopedic Surgery

## 2022-04-12 ENCOUNTER — Ambulatory Visit: Payer: Self-pay

## 2022-04-12 ENCOUNTER — Encounter: Payer: Self-pay | Admitting: Orthopedic Surgery

## 2022-04-12 VITALS — BP 142/88 | HR 81 | Ht 64.5 in | Wt 117.0 lb

## 2022-04-12 DIAGNOSIS — M47812 Spondylosis without myelopathy or radiculopathy, cervical region: Secondary | ICD-10-CM

## 2022-04-12 DIAGNOSIS — M5412 Radiculopathy, cervical region: Secondary | ICD-10-CM

## 2022-04-12 NOTE — Progress Notes (Signed)
Orthopedic Spine Surgery Office Note  Assessment: Patient is a 67 y.o. female with history of fibromyalgia who presents with neck pain that radiates into her bilateral upper extremities.  Feels it goes over the lateral aspect of her arm into the dorsal forearm and into the entire hand (in all distributions).  Has disc height loss at C5-6, possible radiculopathy   Plan: -Explained that initially conservative treatment is tried as a significant number of patients may experience relief with these treatment modalities. Discussed that the conservative treatments include:  -activity modification  -physical therapy  -over the counter pain medications  -medrol dosepak  -cervical steroid injections -Patient has tried multiple medical treatments with Dr. Estanislado Pandy for over a year now without any relief of her symptoms.  She has also tried physical therapy and acupuncture and neither of those provided any relief -Recommended MRI of the cervical spine to evaluate for radiculopathy -Patient should return to office in 5 weeks, x-rays at next visit: None   Patient expressed understanding of the plan and all questions were answered to the patient's satisfaction.   ___________________________________________________________________________   History:  Patient is a 67 y.o. female who presents today for cervical spine.  Patient has had 20 years of neck pain that radiates into her bilateral arms.  There is no trauma or injury that brought on the pain.  Pain is felt going into the lateral aspect of the arms and into the dorsal forearms.  It then radiates into the hands bilaterally.  She feels it in all distributions in the hands on both the dorsal and volar aspects.  Pain has been constant and felt on a daily basis.  It has been stable recently.  She has been working with Dr. Estanislado Pandy to treat this pain.  However, after multiple attempts at medications, they have been unable to find one that helps her  significantly.  Part of the issue is she has multiple allergies to the medications.  Pain is worse if she is stressed, does not eat right, does not do her exercises.  It does get somewhat better if she rest but does not completely go away.  She has not noticed anything else that makes it better or worse.  Gets paresthesias in the same distribution as the pain.   Weakness: Denies Difficulty with fine motor skills (e.g., buttoning shirts, handwriting): Reports the symptoms Symptoms of imbalance: Yes feels off balance at times Paresthesias and numbness: Yes, gets paresthesias into bilateral arms.  No numbness Bowel or bladder incontinence: Denies Saddle anesthesia: Denies  Treatments tried: Physical therapy, NSAIDs, Tylenol, acupuncture, Lyrica, gabapentin, narcotics, activity modification  Review of systems: Denies fevers and chills, night sweats, unexplained weight loss, history of cancer.  Has had pain that wakes her at night  Past medical history: Hyperlipidemia Fibromyalgia Migraines Depression Anxiety GERD Osteoporosis Irritable bowel syndrome Rheumatoid arthritis Vertigo Chronic pain Sicca syndrome Anemia Hypothyroidism Panic attacks  Allergies: Oxycodone, Plaquenil, Reglan, tramadol, codeine, Cymbalta, penicillin, gabapentin, Lyrica, Vicodin, Savella, tramadol, Zanaflex  Past surgical history:  Hysterectomy Cholecystectomy Kidney stone surgery Eyelid surgery Partial mastectomy Nissen fundoplication Polypectomy  Social history: Denies use of nicotine product (smoking, vaping, patches, smokeless) Alcohol use: denies Denies recreational drug use  Physical Exam:  General: no acute distress, appears stated age Neurologic: alert, answering questions appropriately, following commands Respiratory: unlabored breathing on room air, symmetric chest rise Psychiatric: appropriate affect, normal cadence to speech   MSK (spine):  -Strength exam      Left  Right Grip  strength  5/5  5/5 Interosseus   5/5   5/5 Wrist extension  5/5  5/5 Wrist flexion   5/5  5/5 Elbow flexion   5/5  5/5 Deltoid    5/5  5/5  EHL    4/5  4/5 TA    5/5  5/5 GSC    5/5  5/5 Knee extension  5/5  5/5 Hip flexion   5/5  5/5  -Sensory exam    Sensation intact to light touch in L3-S1 nerve distributions of bilateral lower extremities  Sensation intact to light touch in C5-T1 nerve distributions of bilateral upper extremities  -Brachioradialis DTR: 2/4 on the left, 2/4 on the right -Biceps DTR: 2/4 on the left, 2/4 on the right -Achilles DTR: 1/4 on the left, 1/4 on the right -Patellar tendon DTR: 2/4 on the left, 2/4 on the right  -Spurling: negative bilaterally -Hoffman sign: negative bilaterally -Clonus: one beat bilaterally -Interosseous wasting: none seen -Grip and release test: positive (seems pain limited) -Romberg: negative -Gait: slow but normal -Imbalance with tandem gait: some difficulty but able to perform without falling or needing to hold something for balance  Left shoulder exam: no pain through range of motion, negative jobe test, negative belly press, no weakness with external rotation with arm at side Right shoulder exam: no pain through range of motion, negative jobe test, negative belly press, no weakness with external rotation with arm at side  Tinel's at wrist: negative bilaterally Durkan's: negative bilaterally  Tinel's at elbow: negative bilaterally  Imaging: XR of the cervical spine from 04/12/2022 was independently reviewed and interpreted, showing lordotic alignment.  Disc height loss at C5-6 and C6-7.  Anterolisthesis at C7/T1 - does not shift between flexion and extension.  No fracture or dislocation.   Patient name: Danielle Byrd Patient MRN: [id number] Date of visit: 04/12/22

## 2022-04-23 ENCOUNTER — Other Ambulatory Visit: Payer: Medicare Other

## 2022-05-02 ENCOUNTER — Ambulatory Visit
Admission: RE | Admit: 2022-05-02 | Discharge: 2022-05-02 | Disposition: A | Discharge status: Home / Self Care | Payer: Medicare Other | Source: Ambulatory Visit | Attending: Orthopedic Surgery | Admitting: Orthopedic Surgery

## 2022-05-02 DIAGNOSIS — M5412 Radiculopathy, cervical region: Secondary | ICD-10-CM

## 2022-05-22 ENCOUNTER — Ambulatory Visit (INDEPENDENT_AMBULATORY_CARE_PROVIDER_SITE_OTHER): Payer: Medicare Other | Admitting: Orthopedic Surgery

## 2022-05-22 DIAGNOSIS — M5412 Radiculopathy, cervical region: Secondary | ICD-10-CM | POA: Diagnosis not present

## 2022-05-23 NOTE — Progress Notes (Signed)
Orthopedic Spine Surgery Office Note  Assessment: Patient is a 67 y.o. female with neck pain that radiates into her bilateral upper extremities. Given her distribution of pain, I feel that her symptoms are coming from C5/6 and not the stenosis seen incidentally at C3/4.    Plan: -Explained that initially conservative treatment is tried as a significant number of patients may experience relief with these treatment modalities. Discussed that the conservative treatments include:  -activity modification  -physical therapy  -over the counter pain medications  -medrol dosepak  -cervical steroid injections -Patient has tried physical therapy, NSAIDs, Tylenol, acupuncture, Lyrica, gabapentin, narcotics, activity modification   -Recommended cervical spine steroid injection -I told her that some of her numbness and tingling in her hands could be coming from carpal tunnel syndrome -Patient should return to office in 6 weeks, x-rays at next visit: none   Patient expressed understanding of the plan and all questions were answered to the patient's satisfaction.   __________________________________________________________________________  History: Patient is a 67 y.o. female who has been previously seen in the office for symptoms concerning for cervical radiculopathy.  Today, she states her pain starts in her neck and radiates into her shoulders, goes down the lateral aspect of her arms, into her forearms, and hands bilaterally.  She feels it more on the ulnar aspect of the hands but does feel it throughout.  She has not noticed anything that makes it better or worse.  Pain is felt on a daily basis.  She rates her pain as a 7-10 out of 10.  She does get paresthesias and numbness in her bilateral hands that is worse at night. She has the symptoms in all the fingers.  These are more symptomatic on the right side. No other numbness or paresthesias.  Previous treatments: Physical therapy, NSAIDs, Tylenol,  acupuncture, Lyrica, gabapentin, narcotics, activity modification   Physical Exam:  General: no acute distress, appears stated age Neurologic: alert, answering questions appropriately, following commands Respiratory: unlabored breathing on room air, symmetric chest rise Psychiatric: appropriate affect, normal cadence to speech   MSK (spine):  -Strength exam      Left  Right Grip strength                5/5  5/5 Interosseus   5/5   5/5 Wrist extension  5/5  5/5 Wrist flexion   5/5  5/5 Elbow flexion   5/5  5/5 Deltoid    5/5  5/5  EHL    4/5  4/5 TA    5/5  5/5 GSC    5/5  5/5 Knee extension  5/5  5/5 Hip flexion   5/5  5/5  -Sensory exam    Sensation intact to light touch in L3-S1 nerve distributions of bilateral lower extremities  Sensation intact to light touch in C5-T1 nerve distributions of bilateral upper extremities  -Brachioradialis DTR: 2/4 on the left, 2/4 on the right -Biceps DTR: 2/4 on the left, 2/4 on the right -Achilles DTR: 1/4 on the left, 1/4 on the right -Patellar tendon DTR: 2/4 on the left, 2/4 on the right  -Spurling: negative bilaterally -Hoffman sign: negative bilaterally -Clonus: one beat bilaterally -Interosseous wasting: none seen -Grip and release test: negative -Romberg: negative -Gait: slow but normal -Imbalance with tandem gait: some difficulty but able to perform without falling or needing to hold something for balance  Left shoulder exam: no pain through range of motion, negative jobe test, negative belly press, no weakness with external rotation with  arm at side Right shoulder exam: no pain through range of motion, negative jobe test, negative belly press, no weakness with external rotation with arm at side  Tinel's at wrist: Negative bilaterally Phalen's at wrist: Negative bilaterally Durkan's: Negative bilaterally  Tinel's at elbow: Negative bilaterally  Imaging: XR of the cervical spine from 04/12/2022 was previously  independently reviewed and interpreted, showing lordotic alignment.  Disc height loss at C5-6 and C6-7.  Anterolisthesis at C7/T1 - does not shift between flexion and extension.  No fracture or dislocation.   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 No central stenosis. No T2 cord signal change.   Has an EMG/NCS from 12/2020 showing evidence of bilateral carpal tunnel syndrome  Patient name: Danielle Byrd Patient MRN: [id number] Date of visit: 05/23/22

## 2022-05-29 ENCOUNTER — Telehealth: Payer: Self-pay | Admitting: Physical Medicine and Rehabilitation

## 2022-05-29 DIAGNOSIS — M5412 Radiculopathy, cervical region: Secondary | ICD-10-CM

## 2022-05-29 NOTE — Telephone Encounter (Signed)
Patient would like to make an appointment best number to reach her [phone]

## 2022-05-30 ENCOUNTER — Telehealth: Payer: Self-pay | Admitting: Physical Medicine and Rehabilitation

## 2022-05-30 NOTE — Telephone Encounter (Signed)
Patient called. Returning a call to schedule with [facility].

## 2022-05-30 NOTE — Telephone Encounter (Signed)
LVM to return call to clinic  I saw where Dr. Laurance Flatten recommended a cervical ESI, but did not see an order. Would he like for her to get one?

## 2022-06-01 NOTE — Telephone Encounter (Signed)
Authorization pending for injection

## 2022-06-14 ENCOUNTER — Other Ambulatory Visit: Payer: Medicare Other

## 2022-06-14 ENCOUNTER — Ambulatory Visit (INDEPENDENT_AMBULATORY_CARE_PROVIDER_SITE_OTHER): Payer: Medicare Other | Admitting: Physical Medicine and Rehabilitation

## 2022-06-14 VITALS — BP 124/70 | HR 76

## 2022-06-14 DIAGNOSIS — M5412 Radiculopathy, cervical region: Secondary | ICD-10-CM

## 2022-06-14 MED ORDER — METHYLPREDNISOLONE ACETATE 80 MG/ML IJ SUSP
40.0000 mg | Freq: Once | INTRAMUSCULAR | Status: AC
  Administered 2022-06-14: 40 mg

## 2022-06-14 NOTE — Patient Instructions (Signed)

## 2022-06-14 NOTE — Progress Notes (Signed)
Functional Pain Scale - descriptive words and definitions  Unmanageable (7)  Pain interferes with normal ADL's/nothing seems to help/sleep is very difficult/active distractions are very difficult to concentrate on. Severe range order  Average Pain 7   +Driver, -BT, -Dye Allergies.  Neck pain on both sides, but worse on right. Radiates into head, right shoulder and down the arm to the top of the right hand

## 2022-06-27 NOTE — Procedures (Signed)
Cervical Epidural Steroid Injection - Interlaminar Approach with Fluoroscopic Guidance  Patient: Danielle Byrd      Date of Birth: 18-Jan-1956 MRN: [id number] PCP: Allie Dimmer, MD      Visit Date: 06/14/2022   Universal Protocol:    Date/Time: 06/26/2408:51 AM  Consent Given By: the patient  Position: PRONE  Additional Comments: Vital signs were monitored before and after the procedure. Patient was prepped and draped in the usual sterile fashion. The correct patient, procedure, and site was verified.   Injection Procedure Details:   Procedure diagnoses: Cervical radiculopathy [M54.12]    Meds Administered:  Meds ordered this encounter  Medications   methylPREDNISolone acetate (DEPO-MEDROL) injection 40 mg     Laterality: Right  Location/Site: C7-T1  Needle: 3.5 in., 20 ga. Tuohy  Needle Placement: Paramedian epidural space  Findings:  -Comments: Excellent flow of contrast into the epidural space.  Procedure Details: Using a paramedian approach from the side mentioned above, the region overlying the inferior lamina was localized under fluoroscopic visualization and the soft tissues overlying this structure were infiltrated with 4 ml. of 1% Lidocaine without Epinephrine. A # 20 gauge, Tuohy needle was inserted into the epidural space using a paramedian approach.  The epidural space was localized using loss of resistance along with contralateral oblique bi-planar fluoroscopic views.  After negative aspirate for air, blood, and CSF, a 2 ml. volume of Isovue-250 was injected into the epidural space and the flow of contrast was observed. Radiographs were obtained for documentation purposes.   The injectate was administered into the level noted above.  Additional Comments:  No complications occurred Dressing: 2 x 2 sterile gauze and Band-Aid    Post-procedure details: Patient was observed during the procedure. Post-procedure instructions were reviewed.  Patient  left the clinic in stable condition.

## 2022-06-27 NOTE — Progress Notes (Signed)
Danielle Byrd - 67 y.o. female MRN [id number]  Date of birth: [birth year]  Office Visit Note: Visit Date: 06/14/2022 PCP: Allie Dimmer, MD Referred by: Callie Fielding, MD  Subjective: Chief Complaint  Patient presents with   Neck - Pain   HPI:  Danielle Byrd is a 67 y.o. female who comes in today at the request of Dr. Ileene Rubens for planned Right C7-T1 Cervical Interlaminar epidural steroid injection with fluoroscopic guidance.  The patient has failed conservative care including home exercise, medications, time and activity modification.  This injection will be diagnostic and hopefully therapeutic.  Please see requesting physician notes for further details and justification.   ROS Otherwise per HPI.  Assessment & Plan: Visit Diagnoses:    ICD-10-CM   1. Cervical radiculopathy  M54.12 XR C-ARM NO REPORT    Epidural Steroid injection    methylPREDNISolone acetate (DEPO-MEDROL) injection 40 mg      Plan: No additional findings.   Meds & Orders:  Meds ordered this encounter  Medications   methylPREDNISolone acetate (DEPO-MEDROL) injection 40 mg    Orders Placed This Encounter  Procedures   XR C-ARM NO REPORT   Epidural Steroid injection    Follow-up: Return for visit to requesting provider as needed.   Procedures: No procedures performed  Cervical Epidural Steroid Injection - Interlaminar Approach with Fluoroscopic Guidance  Patient: Danielle Byrd      Date of Birth: 01/02/1956 MRN: [id number] PCP: Allie Dimmer, MD      Visit Date: 06/14/2022   Universal Protocol:    Date/Time: 06/26/2408:51 AM  Consent Given By: the patient  Position: PRONE  Additional Comments: Vital signs were monitored before and after the procedure. Patient was prepped and draped in the usual sterile fashion. The correct patient, procedure, and site was verified.   Injection Procedure Details:   Procedure diagnoses: Cervical radiculopathy [M54.12]    Meds  Administered:  Meds ordered this encounter  Medications   methylPREDNISolone acetate (DEPO-MEDROL) injection 40 mg     Laterality: Right  Location/Site: C7-T1  Needle: 3.5 in., 20 ga. Tuohy  Needle Placement: Paramedian epidural space  Findings:  -Comments: Excellent flow of contrast into the epidural space.  Procedure Details: Using a paramedian approach from the side mentioned above, the region overlying the inferior lamina was localized under fluoroscopic visualization and the soft tissues overlying this structure were infiltrated with 4 ml. of 1% Lidocaine without Epinephrine. A # 20 gauge, Tuohy needle was inserted into the epidural space using a paramedian approach.  The epidural space was localized using loss of resistance along with contralateral oblique bi-planar fluoroscopic views.  After negative aspirate for air, blood, and CSF, a 2 ml. volume of Isovue-250 was injected into the epidural space and the flow of contrast was observed. Radiographs were obtained for documentation purposes.   The injectate was administered into the level noted above.  Additional Comments:  No complications occurred Dressing: 2 x 2 sterile gauze and Band-Aid    Post-procedure details: Patient was observed during the procedure. Post-procedure instructions were reviewed.  Patient left the clinic in stable condition.   Clinical History: No specialty comments available.     Objective:  VS:  HT:    WT:   BMI:     BP:124/70  HR:76bpm  TEMP: ( )  RESP:  Physical Exam Vitals and nursing note reviewed.  Constitutional:      General: She is not in acute distress.    Appearance: Normal appearance.  She is not ill-appearing.  HENT:     Head: Normocephalic and atraumatic.     Right Ear: External ear normal.     Left Ear: External ear normal.  Eyes:     Extraocular Movements: Extraocular movements intact.  Cardiovascular:     Rate and Rhythm: Normal rate.     Pulses: Normal pulses.   Musculoskeletal:     Cervical back: Tenderness present. No rigidity.     Right lower leg: No edema.     Left lower leg: No edema.     Comments: Patient has good strength in the upper extremities including 5 out of 5 strength in wrist extension long finger flexion and APB.  There is no atrophy of the hands intrinsically.  There is a negative Hoffmann's test.   Lymphadenopathy:     Cervical: No cervical adenopathy.  Skin:    Findings: No erythema, lesion or rash.  Neurological:     General: No focal deficit present.     Mental Status: She is alert and oriented to person, place, and time.     Sensory: No sensory deficit.     Motor: No weakness or abnormal muscle tone.     Coordination: Coordination normal.  Psychiatric:        Mood and Affect: Mood normal.        Behavior: Behavior normal.      Imaging: No results found.

## 2022-08-30 NOTE — Progress Notes (Signed)
Office Visit Note  Patient: Danielle Byrd             Date of Birth: 1955-05-01           MRN: [id number]             PCP: Majel Homer, MD Referring: Majel Homer, MD Visit Date: 09/13/2022 Occupation: @GUAROCC @  Subjective:  Pain in multiple joints   History of Present Illness: Danielle Byrd is a 67 y.o. female with seronegative rheumatoid arthritis, osteoarthritis, degenerative disc disease and fibromyalgia syndrome.  She returns today after her last visit in December 2023.  She had bilateral trigger point injections at the last visit and had good relief from it.  She states that the trapezius pain has returned.  She has been having increased pain and discomfort in her bilateral wrist joints in her right shoulder.  She has intermittent discomfort in the ischial bursa and left trochanteric bursa.  She denies any discomfort in her knee joint.  She continues to have pain and discomfort from fibromyalgia.  She is not on any immunosuppressive agents.  She tried hydroxychloroquine in 2022 which she discontinued after few months due to dizziness and oral ulcers.    Activities of Daily Living:  Patient reports morning stiffness for 4 hours.   Patient Reports nocturnal pain.  Difficulty dressing/grooming: Reports Difficulty climbing stairs: Denies Difficulty getting out of chair: Reports Difficulty using hands for taps, buttons, cutlery, and/or writing: Reports  Review of Systems  Constitutional:  Positive for fatigue.  HENT:  Positive for mouth dryness. Negative for mouth sores.   Eyes:  Positive for dryness.  Respiratory:  Negative for shortness of breath.   Cardiovascular:  Negative for chest pain and palpitations.  Gastrointestinal:  Positive for constipation. Negative for blood in stool and diarrhea.  Endocrine: Negative for increased urination.  Genitourinary:  Negative for involuntary urination.  Musculoskeletal:  Positive for joint pain, gait problem, joint pain,  myalgias, muscle weakness, morning stiffness, muscle tenderness and myalgias. Negative for joint swelling.  Skin:  Positive for sensitivity to sunlight. Negative for color change, rash and hair loss.  Allergic/Immunologic: Negative for susceptible to infections.  Neurological:  Positive for dizziness and headaches.  Hematological:  Negative for swollen glands.  Psychiatric/Behavioral:  Positive for depressed mood and sleep disturbance. The patient is nervous/anxious.     PMFS History:  Patient Active Problem List   Diagnosis Date Noted   Bilateral hand pain 01/26/2021   Ganglion cyst of volar aspect of right wrist 01/26/2021   Left thigh pain 05/05/2019   Osteoarthritis of lumbar spine 05/05/2019   Hx of colonic polyps 09/05/2017   Family hx of colon cancer 09/05/2017   Migraine without aura and without status migrainosus, not intractable 03/04/2017   Fibromyalgia 11/22/2016   Other fatigue 11/22/2016   DDD (degenerative disc disease), cervical 11/22/2016   Primary osteoarthritis of both hands 11/22/2016   History of migraine 11/22/2016   History of anxiety 11/22/2016   History of hypothyroidism 11/22/2016   History of kidney stones 11/22/2016   History of cholecystectomy 11/22/2016   History of anemia 11/22/2016   Hx of bone density study/ normal 2014 this is followed by her Primary Care  11/22/2016   Sicca syndrome (HCC) 11/22/2016   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   Headache(784.0) 10/27/2013   Leukopenia 07/08/2013   Anemia 10/31/2011   Iron deficiency 10/31/2011   GERD (gastroesophageal reflux disease) 05/14/2011   Abdominal pain, bilateral upper quadrant 05/14/2011   H/O  fibromyalgia 05/14/2011   Constipation 05/14/2011   Hyperlipemia 05/14/2011   Kidney stones 01/11/2011    Past Medical History:  Diagnosis Date   Anemia    Anxiety    Arthritis    Cervical stenosis (uterine cervix)    Closed fracture of left foot    Constipation    Copper deficiency     Depression    Fibromyalgia    GERD (gastroesophageal reflux disease)    High cholesterol    History of kidney stones    Hyperlipidemia    Hypothyroidism    Kidney stones    Lumbar stenosis 11/27/2016   Melanosis coli    Migraines    Nephrolithiasis    Osteopenia 12/2019   Per patient, diagnosed by PCP Majel Homer, MD   Osteoporosis 12/2019   Per patient, diagnosed by PCP Majel Homer, MD   Panic attack    Rosacea    Sliding hiatal hernia    Spondylosis, cervical     Family History  Problem Relation Age of Onset   Diabetes Mother    Hypertension Mother    Anemia Mother    Colon cancer Father    Cancer Father 65       colon cancer   Heart attack Father    Past Surgical History:  Procedure Laterality Date   ABDOMINAL HYSTERECTOMY     BONE MARROW BIOPSY     & aspirate   BREAST SURGERY Right    partial mastectomy   CHOLECYSTECTOMY     COLONOSCOPY N/A 07/22/2012   Procedure: COLONOSCOPY;  Surgeon: Malissa Hippo, MD;  Location: [facility];  Service: Endoscopy;  Laterality: N/A;  730   COLONOSCOPY WITH PROPOFOL N/A 10/25/2017   Procedure: COLONOSCOPY WITH PROPOFOL;  Surgeon: Malissa Hippo, MD;  Location: [facility];  Service: Endoscopy;  Laterality: N/A;  10:30   DILATION AND CURETTAGE OF UTERUS     EYE SURGERY     sty removal and reconstruction of eye lids.   NISSEN FUNDOPLICATION     POLYPECTOMY  10/25/2017   Procedure: POLYPECTOMY;  Surgeon: Malissa Hippo, MD;  Location: [facility];  Service: Endoscopy;;  sigmoid    Social History   Social History Narrative   Patient lives at home with chacha (boxer, therapy dog)   Patient right handed   Patient has a BS degree.   Patient drinks 2 cups of tea daily.    There is no immunization history on file for this patient.   Objective: Vital Signs: BP 132/79 (BP Location: Left Arm, Patient Position: Sitting, Cuff Size: Normal)   Pulse 64   Resp 14   Ht 5\' 4"  (1.626 m)   Wt 116 lb (52.6 kg)   BMI  19.91 kg/m    Physical Exam Vitals and nursing note reviewed.  Constitutional:      Appearance: She is well-developed.  HENT:     Head: Normocephalic and atraumatic.  Eyes:     Conjunctiva/sclera: Conjunctivae normal.  Cardiovascular:     Rate and Rhythm: Normal rate and regular rhythm.     Heart sounds: Normal heart sounds.  Pulmonary:     Effort: Pulmonary effort is normal.     Breath sounds: Normal breath sounds.  Abdominal:     General: Bowel sounds are normal.     Palpations: Abdomen is soft.  Musculoskeletal:     Cervical back: Normal range of motion.  Lymphadenopathy:     Cervical: No cervical adenopathy.  Skin:  General: Skin is warm and dry.     Capillary Refill: Capillary refill takes less than 2 seconds.  Neurological:     Mental Status: She is alert and oriented to person, place, and time.  Psychiatric:        Behavior: Behavior normal.      Musculoskeletal Exam: She had limited lateral rotation of the cervical spine.  She had thoracic kyphosis.  There was no point tenderness over thoracic or lumbar spine.  She had bilateral trapezius spasm.  Shoulder joints, elbow joints, wrist joints were in good range of motion with no synovitis.  She had ganglion cyst on the volar aspect of her right wrist.  Hip joints and knee joints were in good range of motion.  She had no tenderness over ankles or MTPs.  She had tenderness over left trochanteric bursa.  CDAI Exam: CDAI Score: -- Patient Global: 1 / 100; Provider Global: 0 / 100 Swollen: --; Tender: -- Joint Exam 09/13/2022   No joint exam has been documented for this visit   There is currently no information documented on the homunculus. Go to the Rheumatology activity and complete the homunculus joint exam.  Investigation: No additional findings.  Imaging: No results found.  Recent Labs: Lab Results  Component Value Date   WBC 2.9 (L) 03/16/2021   HGB 11.3 (L) 03/16/2021   PLT 202 03/16/2021   NA 133  (L) 03/16/2021   K 4.1 03/16/2021   CL 96 (L) 03/16/2021   CO2 32 03/16/2021   GLUCOSE 98 03/16/2021   BUN 5 (L) 03/16/2021   CREATININE 0.68 03/16/2021   BILITOT 0.4 03/16/2021   ALKPHOS 72 10/30/2017   AST 19 03/16/2021   ALT 13 03/16/2021   PROT 5.9 (L) 03/16/2021   ALBUMIN 3.9 10/30/2017   CALCIUM 8.9 03/16/2021   GFRAA >60 10/30/2017   July 06, 2022 WBC 2.4, hemoglobin 11.3, platelets 166, CMP normal with GFR> 90, AST 29 ALT 26  Speciality Comments: PL[facility] eye exam appt 10/31/2020 normal [facility] f/u 12 months Macular OCT - WNL @ annual eye exam PLQ 08/22-01/23-dizziness, ou  Procedures:  Trigger Point Inj  Date/Time: 09/13/2022 3:21 PM  Performed by: Pollyann Savoy, MD Authorized by: Pollyann Savoy, MD   Consent Given by:  Patient Site marked: the procedure site was marked   Timeout: prior to procedure the correct patient, procedure, and site was verified   Indications:  Muscle spasm and pain Total # of Trigger Points:  2 Location: neck   Needle Size:  27 G Approach:  Dorsal Medications #1:  0.5 mL lidocaine 1 %; 10 mg triamcinolone acetonide 40 MG/ML Medications #2:  0.5 mL lidocaine 1 %; 10 mg triamcinolone acetonide 40 MG/ML Patient tolerance:  Patient tolerated the procedure well with no immediate complications  Allergies: Oxycodone, Plaquenil [hydroxychloroquine], Reglan [metoclopramide], Tramadol hcl, Codeine, Cymbalta [duloxetine hcl], Egg-derived products, Gabapentin, Hydrocodone-acetaminophen, Penicillins, Savella [milnacipran hcl], Savella [milnacipran], Statins, Voltaren [diclofenac sodium], Zanaflex [tizanidine hcl], and Lyrica [pregabalin]   Assessment / Plan:     Visit Diagnoses: Seronegative rheumatoid arthritis (HCC)-patient's rheumatoid arthritis appears to be in remission.  She had no active synovitis on the examination today.  She continues to have discomfort in her wrist joints.  She has tried hydroxychloroquine in the past and  was discontinued due to dizziness and oral ulcers.  I do not see the need for immunosuppression.  High risk medication use - Plaquenil was discontinued in January 2023 due to dizziness.  She does  not require immunosuppressive therapy at this time.  Sicca syndrome (HCC)-she complains of dry mouth and dry eyes.  Most like related to the medication use.  Primary osteoarthritis of both hands-he had bilateral PIP and DIP thickening with no synovitis.  DDD (degenerative disc disease), cervical -she continues to have discomfort in her cervical spine.  She had limited lateral rotation.  She denies any radiculopathy.  X-rays of the C-spine were performed on 10/13/2020: Minimally narrowed at C3-C4, C5-C6, C6-C7.  Posterior disc spur present at C5-C6.  Trapezius muscle spasm-she had bilateral trapezius spasm.  She had good response to trigger point injections in the past.  She requested trigger point injections again today.  After informed consent was obtained and side effects were discussed.  Side effects of steroids including heart disease, hypertension, diabetes, weight gain, osteoporosis and cataracts were discussed.  Bilateral trapezius area were injected as described above.  Patient tolerated the procedure well.  Postprocedure instructions were discussed.  Ischial bursitis -resolved.  Chronic pain of both knees-no warmth swelling or effusion was noted.  Lower extremity muscle strength exercises were discussed.  Fibromyalgia-she continues to have generalized pain and discomfort from fibromyalgia.  She had generalized hyperalgesia and positive tender points.  Need for regular exercise was emphasized.  Benefits of the stretching were discussed.  Other fatigue-  Primary insomnia -she takes trazodone 50 mg 1 tablet at bedtime for insomnia.  Age-related osteoporosis without current pathological fracture - DEXA 12/14/19: [facility] spine BMD 0.780 with T-score -2.4. Ordered by PCP.  She tried Fosamax in the past and  discontinued due to side effects.  She does not want to take any medications at this point.  Vitamin D deficiency  Other medical problems are listed as follows:  Pedal edema  History of anxiety  History of hypothyroidism  History of kidney stones  History of anemia  History of migraine  Orders: Orders Placed This Encounter  Procedures   Trigger Point Inj   No orders of the defined types were placed in this encounter. .  Follow-Up Instructions: Return in about 6 months (around 03/15/2023) for Rheumatoid arthritis, Osteoarthritis.   Pollyann Savoy, MD  Note - This record has been created using Animal nutritionist.  Chart creation errors have been sought, but may not always  have been located. Such creation errors do not reflect on  the standard of medical care.

## 2022-08-31 ENCOUNTER — Encounter (INDEPENDENT_AMBULATORY_CARE_PROVIDER_SITE_OTHER): Payer: Self-pay | Admitting: *Deleted

## 2022-09-13 ENCOUNTER — Ambulatory Visit: Payer: Medicare Other | Attending: Rheumatology | Admitting: Rheumatology

## 2022-09-13 ENCOUNTER — Encounter: Payer: Self-pay | Admitting: Rheumatology

## 2022-09-13 VITALS — BP 132/79 | HR 64 | Resp 14 | Ht 64.0 in | Wt 116.0 lb

## 2022-09-13 DIAGNOSIS — R600 Localized edema: Secondary | ICD-10-CM

## 2022-09-13 DIAGNOSIS — R5383 Other fatigue: Secondary | ICD-10-CM | POA: Diagnosis not present

## 2022-09-13 DIAGNOSIS — M7918 Myalgia, other site: Secondary | ICD-10-CM | POA: Diagnosis not present

## 2022-09-13 DIAGNOSIS — M0600 Rheumatoid arthritis without rheumatoid factor, unspecified site: Secondary | ICD-10-CM | POA: Insufficient documentation

## 2022-09-13 DIAGNOSIS — M19042 Primary osteoarthritis, left hand: Secondary | ICD-10-CM | POA: Insufficient documentation

## 2022-09-13 DIAGNOSIS — Z87442 Personal history of urinary calculi: Secondary | ICD-10-CM | POA: Insufficient documentation

## 2022-09-13 DIAGNOSIS — M19041 Primary osteoarthritis, right hand: Secondary | ICD-10-CM | POA: Diagnosis not present

## 2022-09-13 DIAGNOSIS — Z79899 Other long term (current) drug therapy: Secondary | ICD-10-CM | POA: Insufficient documentation

## 2022-09-13 DIAGNOSIS — M3500 Sicca syndrome, unspecified: Secondary | ICD-10-CM | POA: Insufficient documentation

## 2022-09-13 DIAGNOSIS — M810 Age-related osteoporosis without current pathological fracture: Secondary | ICD-10-CM | POA: Diagnosis not present

## 2022-09-13 DIAGNOSIS — G8929 Other chronic pain: Secondary | ICD-10-CM | POA: Diagnosis not present

## 2022-09-13 DIAGNOSIS — M5030 Other cervical disc degeneration, unspecified cervical region: Secondary | ICD-10-CM

## 2022-09-13 DIAGNOSIS — M62838 Other muscle spasm: Secondary | ICD-10-CM

## 2022-09-13 DIAGNOSIS — E559 Vitamin D deficiency, unspecified: Secondary | ICD-10-CM

## 2022-09-13 DIAGNOSIS — Z8659 Personal history of other mental and behavioral disorders: Secondary | ICD-10-CM | POA: Diagnosis not present

## 2022-09-13 DIAGNOSIS — M7071 Other bursitis of hip, right hip: Secondary | ICD-10-CM

## 2022-09-13 DIAGNOSIS — Z8669 Personal history of other diseases of the nervous system and sense organs: Secondary | ICD-10-CM | POA: Insufficient documentation

## 2022-09-13 DIAGNOSIS — Z8639 Personal history of other endocrine, nutritional and metabolic disease: Secondary | ICD-10-CM | POA: Diagnosis not present

## 2022-09-13 DIAGNOSIS — M25561 Pain in right knee: Secondary | ICD-10-CM | POA: Diagnosis not present

## 2022-09-13 DIAGNOSIS — F5101 Primary insomnia: Secondary | ICD-10-CM

## 2022-09-13 DIAGNOSIS — M797 Fibromyalgia: Secondary | ICD-10-CM

## 2022-09-13 DIAGNOSIS — M7072 Other bursitis of hip, left hip: Secondary | ICD-10-CM

## 2022-09-13 DIAGNOSIS — M25562 Pain in left knee: Secondary | ICD-10-CM | POA: Diagnosis not present

## 2022-09-13 DIAGNOSIS — Z862 Personal history of diseases of the blood and blood-forming organs and certain disorders involving the immune mechanism: Secondary | ICD-10-CM | POA: Diagnosis not present

## 2022-09-13 MED ORDER — LIDOCAINE HCL 1 % IJ SOLN
0.5000 mL | INTRAMUSCULAR | Status: AC | PRN
  Administered 2022-09-13: .5 mL

## 2022-09-13 MED ORDER — TRIAMCINOLONE ACETONIDE 40 MG/ML IJ SUSP
10.0000 mg | INTRAMUSCULAR | Status: AC | PRN
  Administered 2022-09-13: 10 mg via INTRAMUSCULAR

## 2023-03-07 NOTE — Progress Notes (Unsigned)
Office Visit Note  Patient: Danielle Byrd             Date of Birth: 1955/12/24           MRN: [id number]             PCP: Majel Homer, MD Referring: Majel Homer, MD Visit Date: 03/21/2023 Occupation: @GUAROCC @  Subjective:  Right wrist pain   History of Present Illness: Danielle Byrd is a 67 y.o. female with history of seronegative rheumatoid arthritis and fibromyalgia.  Patient presents today with significant pain involving the right wrist.  She has had chronic pain in the right wrist for over 1 year but has had increased discomfort over the past 1 month.  No recent injury or fall.  No overuse or repetitive activities.  She has been applying lidocaine 4% patches daily for pain relief.  Patient states that the pain in her right wrist is worse later in the day and she has had increased nocturnal pain.  She has been taking Tylenol for pain relief and occasionally uses Voltaren gel topically.  Patient continues to take methocarbamol as needed for muscle spasms.   Activities of Daily Living:  Patient reports morning stiffness for all day. Patient Reports nocturnal pain.  Difficulty dressing/grooming: Reports Difficulty climbing stairs: Denies Difficulty getting out of chair: Denies Difficulty using hands for taps, buttons, cutlery, and/or writing: Reports  Review of Systems  Constitutional:  Positive for fatigue.  HENT:  Positive for mouth dryness. Negative for mouth sores and nose dryness.   Eyes:  Positive for dryness. Negative for pain and visual disturbance.  Respiratory:  Negative for cough, hemoptysis, shortness of breath and difficulty breathing.   Cardiovascular:  Negative for chest pain, palpitations, hypertension and swelling in legs/feet.  Gastrointestinal:  Positive for constipation. Negative for blood in stool and diarrhea.  Endocrine: Positive for increased urination.  Genitourinary:  Negative for painful urination and involuntary urination.   Musculoskeletal:  Positive for joint pain, joint pain, joint swelling, myalgias, muscle weakness, morning stiffness, muscle tenderness and myalgias. Negative for gait problem.  Skin:  Negative for color change, pallor, rash, hair loss, nodules/bumps, skin tightness, ulcers and sensitivity to sunlight.  Allergic/Immunologic: Negative for susceptible to infections.  Neurological:  Negative for dizziness, numbness, headaches and weakness.  Hematological:  Negative for swollen glands.  Psychiatric/Behavioral:  Positive for sleep disturbance. Negative for depressed mood. The patient is nervous/anxious.     PMFS History:  Patient Active Problem List   Diagnosis Date Noted   Bilateral hand pain 01/26/2021   Ganglion cyst of volar aspect of right wrist 01/26/2021   Left thigh pain 05/05/2019   Osteoarthritis of lumbar spine 05/05/2019   Hx of colonic polyps 09/05/2017   Family hx of colon cancer 09/05/2017   Migraine without aura and without status migrainosus, not intractable 03/04/2017   Fibromyalgia 11/22/2016   Other fatigue 11/22/2016   DDD (degenerative disc disease), cervical 11/22/2016   Primary osteoarthritis of both hands 11/22/2016   History of migraine 11/22/2016   History of anxiety 11/22/2016   History of hypothyroidism 11/22/2016   History of kidney stones 11/22/2016   History of cholecystectomy 11/22/2016   History of anemia 11/22/2016   Hx of bone density study/ normal 2014 this is followed by her Primary Care  11/22/2016   Sicca syndrome (HCC) 11/22/2016   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   Headache 10/27/2013   Leukopenia 07/08/2013   Anemia 10/31/2011   Iron deficiency 10/31/2011   GERD (  gastroesophageal reflux disease) 05/14/2011   Abdominal pain, bilateral upper quadrant 05/14/2011   H/O fibromyalgia 05/14/2011   Constipation 05/14/2011   Hyperlipemia 05/14/2011   Kidney stones 01/11/2011    Past Medical History:  Diagnosis Date   Anemia    Anxiety     Arthritis    Cervical stenosis (uterine cervix)    Closed fracture of left foot    Constipation    Copper deficiency    Depression    Fibromyalgia    GERD (gastroesophageal reflux disease)    High cholesterol    History of kidney stones    Hyperlipidemia    Hypothyroidism    Kidney stones    Lumbar stenosis 11/27/2016   Melanosis coli    Migraines    Nephrolithiasis    Osteopenia 12/2019   Per patient, diagnosed by PCP Majel Homer, MD   Osteoporosis 12/2019   Per patient, diagnosed by PCP Majel Homer, MD   Panic attack    Rosacea    Sliding hiatal hernia    Spondylosis, cervical     Family History  Problem Relation Age of Onset   Diabetes Mother    Hypertension Mother    Anemia Mother    Colon cancer Father    Cancer Father 66       colon cancer   Heart attack Father    Past Surgical History:  Procedure Laterality Date   ABDOMINAL HYSTERECTOMY     BONE MARROW BIOPSY     & aspirate   BREAST SURGERY Right    partial mastectomy   CHOLECYSTECTOMY     COLONOSCOPY N/A 07/22/2012   Procedure: COLONOSCOPY;  Surgeon: Malissa Hippo, MD;  Location: [facility];  Service: Endoscopy;  Laterality: N/A;  730   COLONOSCOPY WITH PROPOFOL N/A 10/25/2017   Procedure: COLONOSCOPY WITH PROPOFOL;  Surgeon: Malissa Hippo, MD;  Location: [facility];  Service: Endoscopy;  Laterality: N/A;  10:30   DILATION AND CURETTAGE OF UTERUS     EYE SURGERY     sty removal and reconstruction of eye lids.   NISSEN FUNDOPLICATION     POLYPECTOMY  10/25/2017   Procedure: POLYPECTOMY;  Surgeon: Malissa Hippo, MD;  Location: [facility];  Service: Endoscopy;;  sigmoid    Social History   Social History Narrative   Patient lives at home with chacha (boxer, therapy dog)   Patient right handed   Patient has a BS degree.   Patient drinks 2 cups of tea daily.    There is no immunization history on file for this patient.   Objective: Vital Signs: BP (!) 145/87 (BP Location: Left  Arm, Patient Position: Sitting, Cuff Size: Normal)   Pulse 67   Resp 13   Ht 5' 4.5" (1.638 m)   Wt 120 lb 3.2 oz (54.5 kg)   BMI 20.31 kg/m    Physical Exam Vitals and nursing note reviewed.  Constitutional:      Appearance: She is well-developed.  HENT:     Head: Normocephalic and atraumatic.  Eyes:     Conjunctiva/sclera: Conjunctivae normal.  Cardiovascular:     Rate and Rhythm: Normal rate and regular rhythm.     Heart sounds: Normal heart sounds.  Pulmonary:     Effort: Pulmonary effort is normal.     Breath sounds: Normal breath sounds.  Abdominal:     General: Bowel sounds are normal.     Palpations: Abdomen is soft.  Musculoskeletal:  Cervical back: Normal range of motion.  Lymphadenopathy:     Cervical: No cervical adenopathy.  Skin:    General: Skin is warm and dry.     Capillary Refill: Capillary refill takes less than 2 seconds.  Neurological:     Mental Status: She is alert and oriented to person, place, and time.  Psychiatric:        Behavior: Behavior normal.      Musculoskeletal Exam: Generalized hyperalgesia and positive tender points. C-spine has limited ROM with lateral rotation.  Trapezius muscle tension and tenderness bilaterally.  Shoulder joints have limited abduction to about 90 degrees.  Limited ROM of the right wrist. Tenderness of both wrists noted, right > left.  No synovitis noted.  Ganglion cyst noted on the radial aspect of both wrist.  Hip joints have slightly limited ROM. Tenderness of bilateral trochanteric bursa.  Knee joints have good ROM with no warmth or effusion.  Ankle joints have good ROM with no tenderness or joint swelling.   CDAI Exam: CDAI Score: -- Patient Global: --; Provider Global: -- Swollen: --; Tender: -- Joint Exam 03/21/2023   No joint exam has been documented for this visit   There is currently no information documented on the homunculus. Go to the Rheumatology activity and complete the homunculus joint  exam.  Investigation: No additional findings.  Imaging: No results found.  Recent Labs: Lab Results  Component Value Date   WBC 2.9 (L) 03/16/2021   HGB 11.3 (L) 03/16/2021   PLT 202 03/16/2021   NA 133 (L) 03/16/2021   K 4.1 03/16/2021   CL 96 (L) 03/16/2021   CO2 32 03/16/2021   GLUCOSE 98 03/16/2021   BUN 5 (L) 03/16/2021   CREATININE 0.68 03/16/2021   BILITOT 0.4 03/16/2021   ALKPHOS 72 10/30/2017   AST 19 03/16/2021   ALT 13 03/16/2021   PROT 5.9 (L) 03/16/2021   ALBUMIN 3.9 10/30/2017   CALCIUM 8.9 03/16/2021   GFRAA >60 10/30/2017    Speciality Comments: [facility] eye exam appt 10/31/2020 normal [facility] f/u 12 months Macular OCT - WNL @ annual eye exam PLQ 08/22-01/23-dizziness, ou  Procedures:  Medium Joint Inj: R intercarpal on 03/21/2023 4:12 PM Indications: pain and joint swelling Details: 27 G 1.5 in needle, ultrasound-guided dorsal approach Medications: 30 mg triamcinolone acetonide 40 MG/ML; 1 mL lidocaine 1 % Aspirate: 0 mL Outcome: tolerated well, no immediate complications Procedure, treatment alternatives, risks and benefits explained, specific risks discussed. Consent was given by the patient. Immediately prior to procedure a time out was called to verify the correct patient, procedure, equipment, support staff and site/side marked as required. Patient was prepped and draped in the usual sterile fashion.     Allergies: Oxycodone, Plaquenil [hydroxychloroquine], Reglan [metoclopramide], Tramadol hcl, Codeine, Cymbalta [duloxetine hcl], Egg-derived products, Gabapentin, Hydrocodone-acetaminophen, Penicillins, Savella [milnacipran hcl], Savella [milnacipran], Statins, Voltaren [diclofenac sodium], Zanaflex [tizanidine hcl], and Lyrica [pregabalin]   Assessment / Plan:     Visit Diagnoses: Seronegative rheumatoid arthritis (HCC) -She is not currently taking immunosuppressive agents.  She had 0% improvement when previously taking Plaquenil  and discontinued due to dizziness.  She has not a good candidate for immunosuppressive therapy due to history of neutropenia.   Patient presents today with increased pain involving the right wrist.  She has had chronic pain involving the right wrist but has had an exacerbation of symptoms over the past 1 month.  She has been having difficulty using her right wrist due to severity  of pain.  She currently rates the pain a 7 out of 10.  She has been using lidocaine 4% patches topically on a daily basis for pain relief.  On examination no synovitis was noted.  Different treatment options were discussed.  Patient requested a right wrist cortisone injection today.  She tolerated procedure well.  Procedure was completed above.  She was advised to notify us if her symptoms persist or worsen.  High risk medication use - Plaquenil was discontinued in January 2023 due to dizziness.  She does not require immunosuppressive therapy at this time.  Sicca syndrome (HCC): Chronic, stable.   Primary osteoarthritis of both hands: PIP and DIP thickening.  Patient presents today with increased pain involving both wrists, right greater than left.  She has been using Voltaren gel topically as needed as well as taking Tylenol for pain relief.  She is also tried applying lidocaine 4% patches to her right wrist.  The right wrist was injected with cortisone under ultrasound guidance today.  She tolerated procedure well and the procedure note was completed above.  Pain in right wrist -Patient presents today with increased pain involving the right wrist.  No recent injury or fall.  Her symptoms have been severe for the past 1 month.  She currently rates her pain a 7 out of 10.  She has had difficulty using her right wrist due to severity of pain.  She has noticed intermittent swelling.  Her symptoms have been most severe in the evenings and in the middle of the night.  She is not experiencing any paresthesias in the right hand at this  time.  She has tenderness over the ulnar aspect of the right wrist today.  Different treatment options were discussed.  She requested an ultrasound-guided right wrist cortisone injection.  She tolerated procedure well.  Procedure note completed above.  Aftercare was discussed.  She was advised to notify us if her symptoms persist or worsen. Plan: US Guided Needle Placement  DDD (degenerative disc disease), cervical - .  X-rays of the C-spine were performed on 10/13/2020: Minimally narrowed at C3-C4, C5-C6, C6-C7.  Posterior disc spur present at C5-C6.  Trapezius muscle spasm: Patient continues to experience trapezius muscle tension tenderness bilaterally.  She has limited range of motion with lateral Tatian of the C-spine.  Chronic pain of both knees: She has good range of motion of both knee joints on examination today.  No warmth or effusion noted.  Fibromyalgia: She has generalized hyperalgesia and positive tender points on exam.  Patient continues to experience frequent and severe fibromyalgia flares.  She has been taking methocarbamol as needed for muscle spasms and Tylenol for pain relief.  Other fatigue: Chronic, stable.  Primary insomnia: She is not experiencing nocturnal pain in her right wrist.  A right wrist cortisone injection was performed today.  Procedure note was completed above.  Age-related osteoporosis without current pathological fracture - DEXA 12/14/19: [facility] spine BMD 0.780 with T-score -2.4. Ordered by PCP.  She tried Fosamax in the past and discontinued due to side effects.declined tx  Vitamin D deficiency  Other medical conditions are listed as follows:   Pedal edema  History of anxiety  History of hypothyroidism  History of kidney stones  History of anemia  History of migraine  Family history of diabetes mellitus    Orders: Orders Placed This Encounter  Procedures   Medium Joint Inj   US Guided Needle Placement   No orders of the defined types were  placed in this encounter.   Follow-Up Instructions: Return in about 6 months (around 09/19/2023) for Rheumatoid arthritis, Fibromyalgia.   Gearldine Bienenstock, PA-C  Note - This record has been created using Dragon software.  Chart creation errors have been sought, but may not always  have been located. Such creation errors do not reflect on  the standard of medical care.

## 2023-03-21 ENCOUNTER — Ambulatory Visit: Payer: Medicare Other

## 2023-03-21 ENCOUNTER — Ambulatory Visit: Payer: Medicare Other | Attending: Physician Assistant | Admitting: Physician Assistant

## 2023-03-21 ENCOUNTER — Encounter: Payer: Self-pay | Admitting: Physician Assistant

## 2023-03-21 VITALS — BP 145/87 | HR 67 | Resp 13 | Ht 64.5 in | Wt 120.2 lb

## 2023-03-21 DIAGNOSIS — Z8669 Personal history of other diseases of the nervous system and sense organs: Secondary | ICD-10-CM | POA: Diagnosis present

## 2023-03-21 DIAGNOSIS — M797 Fibromyalgia: Secondary | ICD-10-CM | POA: Insufficient documentation

## 2023-03-21 DIAGNOSIS — R5383 Other fatigue: Secondary | ICD-10-CM | POA: Insufficient documentation

## 2023-03-21 DIAGNOSIS — M25531 Pain in right wrist: Secondary | ICD-10-CM

## 2023-03-21 DIAGNOSIS — Z79899 Other long term (current) drug therapy: Secondary | ICD-10-CM | POA: Insufficient documentation

## 2023-03-21 DIAGNOSIS — M5030 Other cervical disc degeneration, unspecified cervical region: Secondary | ICD-10-CM | POA: Diagnosis present

## 2023-03-21 DIAGNOSIS — Z8659 Personal history of other mental and behavioral disorders: Secondary | ICD-10-CM | POA: Diagnosis present

## 2023-03-21 DIAGNOSIS — F5101 Primary insomnia: Secondary | ICD-10-CM | POA: Diagnosis present

## 2023-03-21 DIAGNOSIS — R600 Localized edema: Secondary | ICD-10-CM | POA: Diagnosis present

## 2023-03-21 DIAGNOSIS — M19041 Primary osteoarthritis, right hand: Secondary | ICD-10-CM | POA: Diagnosis present

## 2023-03-21 DIAGNOSIS — G8929 Other chronic pain: Secondary | ICD-10-CM | POA: Insufficient documentation

## 2023-03-21 DIAGNOSIS — Z862 Personal history of diseases of the blood and blood-forming organs and certain disorders involving the immune mechanism: Secondary | ICD-10-CM | POA: Diagnosis present

## 2023-03-21 DIAGNOSIS — E559 Vitamin D deficiency, unspecified: Secondary | ICD-10-CM | POA: Diagnosis present

## 2023-03-21 DIAGNOSIS — M19042 Primary osteoarthritis, left hand: Secondary | ICD-10-CM | POA: Diagnosis present

## 2023-03-21 DIAGNOSIS — M3500 Sicca syndrome, unspecified: Secondary | ICD-10-CM | POA: Insufficient documentation

## 2023-03-21 DIAGNOSIS — Z87442 Personal history of urinary calculi: Secondary | ICD-10-CM | POA: Diagnosis present

## 2023-03-21 DIAGNOSIS — M25561 Pain in right knee: Secondary | ICD-10-CM | POA: Diagnosis present

## 2023-03-21 DIAGNOSIS — M25562 Pain in left knee: Secondary | ICD-10-CM | POA: Insufficient documentation

## 2023-03-21 DIAGNOSIS — Z833 Family history of diabetes mellitus: Secondary | ICD-10-CM | POA: Diagnosis present

## 2023-03-21 DIAGNOSIS — M62838 Other muscle spasm: Secondary | ICD-10-CM | POA: Diagnosis present

## 2023-03-21 DIAGNOSIS — M810 Age-related osteoporosis without current pathological fracture: Secondary | ICD-10-CM | POA: Insufficient documentation

## 2023-03-21 DIAGNOSIS — M0600 Rheumatoid arthritis without rheumatoid factor, unspecified site: Secondary | ICD-10-CM | POA: Insufficient documentation

## 2023-03-21 DIAGNOSIS — Z8639 Personal history of other endocrine, nutritional and metabolic disease: Secondary | ICD-10-CM | POA: Diagnosis present

## 2023-03-21 MED ORDER — LIDOCAINE HCL 1 % IJ SOLN
1.0000 mL | INTRAMUSCULAR | Status: AC | PRN
  Administered 2023-03-21: 1 mL

## 2023-03-21 MED ORDER — TRIAMCINOLONE ACETONIDE 40 MG/ML IJ SUSP
30.0000 mg | INTRAMUSCULAR | Status: AC | PRN
  Administered 2023-03-21: 30 mg via INTRA_ARTICULAR

## 2023-09-17 ENCOUNTER — Telehealth: Payer: Self-pay | Admitting: *Deleted

## 2023-09-17 NOTE — Telephone Encounter (Signed)
 I called patient, patient transferred to front desk to schedule appt.

## 2023-09-17 NOTE — Telephone Encounter (Signed)
 Patient is schedule for Right wrist injection on 09/19/2023, patient was bitten by a tick and on antibiotics, patient wants to know if she needs to r/s appt for the injection? Please advise.

## 2023-09-17 NOTE — Telephone Encounter (Signed)
 She may reschedule appointment.

## 2023-09-19 ENCOUNTER — Ambulatory Visit: Payer: Medicare Other | Admitting: Rheumatology

## 2023-09-19 DIAGNOSIS — Z862 Personal history of diseases of the blood and blood-forming organs and certain disorders involving the immune mechanism: Secondary | ICD-10-CM

## 2023-09-19 DIAGNOSIS — R600 Localized edema: Secondary | ICD-10-CM

## 2023-09-19 DIAGNOSIS — Z8639 Personal history of other endocrine, nutritional and metabolic disease: Secondary | ICD-10-CM

## 2023-09-19 DIAGNOSIS — G8929 Other chronic pain: Secondary | ICD-10-CM

## 2023-09-19 DIAGNOSIS — M797 Fibromyalgia: Secondary | ICD-10-CM

## 2023-09-19 DIAGNOSIS — E559 Vitamin D deficiency, unspecified: Secondary | ICD-10-CM

## 2023-09-19 DIAGNOSIS — Z87442 Personal history of urinary calculi: Secondary | ICD-10-CM

## 2023-09-19 DIAGNOSIS — M0600 Rheumatoid arthritis without rheumatoid factor, unspecified site: Secondary | ICD-10-CM

## 2023-09-19 DIAGNOSIS — M810 Age-related osteoporosis without current pathological fracture: Secondary | ICD-10-CM

## 2023-09-19 DIAGNOSIS — Z8669 Personal history of other diseases of the nervous system and sense organs: Secondary | ICD-10-CM

## 2023-09-19 DIAGNOSIS — F5101 Primary insomnia: Secondary | ICD-10-CM

## 2023-09-19 DIAGNOSIS — M19041 Primary osteoarthritis, right hand: Secondary | ICD-10-CM

## 2023-09-19 DIAGNOSIS — M5030 Other cervical disc degeneration, unspecified cervical region: Secondary | ICD-10-CM

## 2023-09-19 DIAGNOSIS — M3500 Sicca syndrome, unspecified: Secondary | ICD-10-CM

## 2023-09-19 DIAGNOSIS — R5383 Other fatigue: Secondary | ICD-10-CM

## 2023-09-19 DIAGNOSIS — Z79899 Other long term (current) drug therapy: Secondary | ICD-10-CM

## 2023-09-19 DIAGNOSIS — Z833 Family history of diabetes mellitus: Secondary | ICD-10-CM

## 2023-09-19 DIAGNOSIS — Z8659 Personal history of other mental and behavioral disorders: Secondary | ICD-10-CM

## 2023-10-02 NOTE — Progress Notes (Signed)
 Office Visit Note  Patient: Danielle Byrd             Date of Birth: February 08, 1956           MRN: [id number]             PCP: Lawrance Handing, MD Referring: Lawrance Handing, MD Visit Date: 10/16/2023 Occupation: @GUAROCC @  Subjective:  Pain in both hands and wrists.  History of Present Illness: NALDA SHACKLEFORD is a 68 y.o. female with seronegative rheumatoid arthritis and fibromyalgia syndrome.  She returns today after her last visit on December 2024.  She states she had good response to right wrist joint injection given at the last visit in December.  She states she continues to have pain and discomfort in the bilateral hands and her bilateral wrist joints.  She has been using braces which does not help much.  She continues to use lidocaine  patches.  She continues to have some discomfort in her right arm.  She has difficulty sleeping on her right side.  She states she has been sleeping on her back which makes her coccyx painful.  She has history of coccyx fracture in the past.  She continues to take Tylenol  and methocarbamol .  She also uses topical Voltaren  gel.    Activities of Daily Living:  Patient reports morning stiffness for 4 hours.   Patient Reports nocturnal pain.  Difficulty dressing/grooming: Reports Difficulty climbing stairs: Denies Difficulty getting out of chair: Reports Difficulty using hands for taps, buttons, cutlery, and/or writing: Reports  Review of Systems  Constitutional:  Positive for fatigue.  HENT:  Positive for mouth dryness. Negative for mouth sores.   Eyes:  Positive for dryness.  Respiratory:  Negative for shortness of breath.   Cardiovascular:  Negative for chest pain and palpitations.  Gastrointestinal:  Positive for constipation. Negative for blood in stool and diarrhea.  Endocrine: Positive for increased urination.  Genitourinary:  Negative for involuntary urination.  Musculoskeletal:  Positive for joint pain, gait problem, joint pain, joint  swelling, myalgias, muscle weakness, morning stiffness, muscle tenderness and myalgias.  Skin:  Positive for color change and hair loss. Negative for rash and sensitivity to sunlight.  Allergic/Immunologic: Negative for susceptible to infections.  Neurological:  Positive for headaches. Negative for dizziness.  Hematological:  Negative for swollen glands.  Psychiatric/Behavioral:  Positive for depressed mood and sleep disturbance. The patient is nervous/anxious.     PMFS History:  Patient Active Problem List   Diagnosis Date Noted   Bilateral hand pain 01/26/2021   Ganglion cyst of volar aspect of right wrist 01/26/2021   Left thigh pain 05/05/2019   Osteoarthritis of lumbar spine 05/05/2019   Hx of colonic polyps 09/05/2017   Family hx of colon cancer 09/05/2017   Migraine without aura and without status migrainosus, not intractable 03/04/2017   Fibromyalgia 11/22/2016   Other fatigue 11/22/2016   DDD (degenerative disc disease), cervical 11/22/2016   Primary osteoarthritis of both hands 11/22/2016   History of migraine 11/22/2016   History of anxiety 11/22/2016   History of hypothyroidism 11/22/2016   History of kidney stones 11/22/2016   History of cholecystectomy 11/22/2016   History of anemia 11/22/2016   Hx of bone density study/ normal 2014 this is followed by her Primary Care  11/22/2016   Sicca syndrome (HCC) 11/22/2016   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   Headache 10/27/2013   Leukopenia 07/08/2013   Anemia 10/31/2011   Iron deficiency 10/31/2011   GERD (gastroesophageal reflux disease) 05/14/2011  Abdominal pain, bilateral upper quadrant 05/14/2011   H/O fibromyalgia 05/14/2011   Constipation 05/14/2011   Hyperlipemia 05/14/2011   Kidney stones 01/11/2011    Past Medical History:  Diagnosis Date   Anemia    Anxiety    Arthritis    Cervical stenosis (uterine cervix)    Closed fracture of left foot    Constipation    Copper  deficiency    Depression     Fibromyalgia    GERD (gastroesophageal reflux disease)    High cholesterol    History of kidney stones    Hyperlipidemia    Hypothyroidism    Kidney stones    Lumbar stenosis 11/27/2016   Melanosis coli    Migraines    Nephrolithiasis    Osteopenia 12/2019   Per patient, diagnosed by PCP [place], MD   Osteoporosis 12/2019   Per patient, diagnosed by PCP [place], MD   Panic attack    Rosacea    Sliding hiatal hernia    Spondylosis, cervical    Vertigo     Family History  Problem Relation Age of Onset   Diabetes Mother    Hypertension Mother    Anemia Mother    Colon cancer Father    Cancer Father 65       colon cancer   Heart attack Father    Past Surgical History:  Procedure Laterality Date   ABDOMINAL HYSTERECTOMY     BONE MARROW BIOPSY     & aspirate   BREAST SURGERY Right    partial mastectomy   CHOLECYSTECTOMY     COLONOSCOPY N/A 07/22/2012   Procedure: COLONOSCOPY;  Surgeon: Claudis RAYMOND Rivet, MD;  Location: [facility];  Service: Endoscopy;  Laterality: N/A;  730   COLONOSCOPY WITH PROPOFOL  N/A 10/25/2017   Procedure: COLONOSCOPY WITH PROPOFOL ;  Surgeon: Rivet Claudis RAYMOND, MD;  Location: [facility];  Service: Endoscopy;  Laterality: N/A;  10:30   DILATION AND CURETTAGE OF UTERUS     EYE SURGERY     sty removal and reconstruction of eye lids.   NISSEN FUNDOPLICATION     POLYPECTOMY  10/25/2017   Procedure: POLYPECTOMY;  Surgeon: Rivet Claudis RAYMOND, MD;  Location: [facility];  Service: Endoscopy;;  sigmoid    Social History   Social History Narrative   Patient lives at home with chacha (boxer, therapy dog)   Patient right handed   Patient has a BS degree.   Patient drinks 2 cups of tea daily.    There is no immunization history on file for this patient.   Objective: Vital Signs: BP (!) 144/77 (BP Location: Left Arm, Patient Position: Sitting, Cuff Size: Small)   Pulse 62   Resp 13   Ht 5' 5.5 (1.664 m)   Wt 117 lb 6.4 oz (53.3 kg)    BMI 19.24 kg/m    Physical Exam Vitals and nursing note reviewed.  Constitutional:      Appearance: She is well-developed.  HENT:     Head: Normocephalic and atraumatic.  Eyes:     Conjunctiva/sclera: Conjunctivae normal.  Cardiovascular:     Rate and Rhythm: Normal rate and regular rhythm.     Heart sounds: Normal heart sounds.  Pulmonary:     Effort: Pulmonary effort is normal.     Breath sounds: Normal breath sounds.  Abdominal:     General: Bowel sounds are normal.     Palpations: Abdomen is soft.  Musculoskeletal:     Cervical back:  Normal range of motion.  Lymphadenopathy:     Cervical: No cervical adenopathy.  Skin:    General: Skin is warm and dry.     Capillary Refill: Capillary refill takes less than 2 seconds.  Neurological:     Mental Status: She is alert and oriented to person, place, and time.  Psychiatric:        Behavior: Behavior normal.      Musculoskeletal Exam: She had slight rotation of the cervical spine without discomfort.  Thoracic kyphosis was noted.  She had no tenderness over thoracic or lumbar spine.  She had bilateral trapezius spasm.  Shoulders, elbows, wrist joints, MCPs PIPs and DIPs with good range of motion with no synovitis.  Ganglion cyst was noted on the volar aspect of her right wrist.  Hips and knee joints with good range of motion without any warmth swelling or effusion.  There was no tenderness over her ankles or MTPs.  CDAI Exam: CDAI Score: -- Patient Global: --; Provider Global: -- Swollen: --; Tender: -- Joint Exam 10/16/2023   No joint exam has been documented for this visit   There is currently no information documented on the homunculus. Go to the Rheumatology activity and complete the homunculus joint exam.  Investigation: No additional findings.  Imaging: No results found.  Recent Labs: Lab Results  Component Value Date   WBC 2.9 (L) 03/16/2021   HGB 11.3 (L) 03/16/2021   PLT 202 03/16/2021   NA 133 (L)  03/16/2021   K 4.1 03/16/2021   CL 96 (L) 03/16/2021   CO2 32 03/16/2021   GLUCOSE 98 03/16/2021   BUN 5 (L) 03/16/2021   CREATININE 0.68 03/16/2021   BILITOT 0.4 03/16/2021   ALKPHOS 72 10/30/2017   AST 19 03/16/2021   ALT 13 03/16/2021   PROT 5.9 (L) 03/16/2021   ALBUMIN 3.9 10/30/2017   CALCIUM 8.9 03/16/2021   GFRAA >60 10/30/2017    Speciality Comments: PL[facility] eye exam appt 10/31/2020 normal [facility] f/u 12 months Macular OCT - WNL @ annual eye exam PLQ 08/22-01/23-dizziness, ou  Procedures:  Trigger Point Inj  Date/Time: 10/16/2023 11:58 AM  Performed by: Dolphus Reiter, MD Authorized by: Dolphus Reiter, MD   Consent Given by:  Patient Site marked: the procedure site was marked   Timeout: prior to procedure the correct patient, procedure, and site was verified   Indications:  Muscle spasm and pain Total # of Trigger Points:  2 Location: neck   Needle Size:  27 G Approach:  Dorsal Medications #1:  0.5 mL lidocaine  1 %; 20 mg triamcinolone  acetonide 40 MG/ML Medications #2:  0.5 mL lidocaine  1 %; 20 mg triamcinolone  acetonide 40 MG/ML Patient tolerance:  Patient tolerated the procedure well with no immediate complications Comments: Risk of infection, tendon injury, nerve injury, hypopigmentation and dermal atrophy were discussed.  Allergies: Oxycodone , Plaquenil  [hydroxychloroquine ], Reglan  [metoclopramide ], Tramadol  hcl, Codeine, Cymbalta [duloxetine hcl], Egg-derived products, Gabapentin, Hydrocodone-acetaminophen , Penicillins, Savella [milnacipran hcl], Savella [milnacipran], Statins, Voltaren  [diclofenac  sodium], Zanaflex [tizanidine hcl], and Lyrica [pregabalin]   Assessment / Plan:     Visit Diagnoses: Seronegative rheumatoid arthritis (HCC) -no synovitis was noted on the examination.  She continues to have discomfort in her wrist and hands.  She had been off hydroxychloroquine  since January 2023.  No need for immunosuppression was discussed  with the patient.  High risk medication use - Plaquenil  discontinued in January 2023 due to dizziness.  Sicca syndrome (HCC)-most likely related to medication use.  Over-the-counter  products were discussed.  Primary osteoarthritis of both hands-she is mild PIP and DIP thickening but no synovitis was noted.  Joint protection muscle strengthening was discussed.  Pain in right wrist -patient requested cortisone injection at the last visit.  Right wrist joint was injected March 21, 2023 under ultrasound guidance.  She noted improvement after the injection.  I do not see any synovitis on the examination.  Chronic pain of both knees-she continues to have some discomfort in her knee joints.  No warmth swelling or effusion was noted.  DDD (degenerative disc disease), cervical - Multilevel spondylosis and spurring was noted.  She had limited range of motion.  Trapezius muscle spasm-she requested bilateral trigger point injections.  After informed consent was obtained bilateral trapezius area were injected with lidocaine  and Kenalog  as described above.  She tolerated the procedure well.  Postprocedure instructions were given.  Fibromyalgia -she continues to have generalized pain and discomfort from fibromyalgia.  Need for regular exercise and stretching was discussed.  She takes methocarbamol  as needed.  Other fatigue-related to fibromyalgia.  Primary insomnia-she has chronic insomnia.  Good sleep hygiene was discussed.  She takes trazodone as needed.  Vitamin D deficiency  Age-related osteoporosis without current pathological fracture - December 14, 2019 DEXA scan: [facility] spine BMD 0.780, T-score -2.4.  Followed by her PCP.  Fosamax was discontinued due to side effects.  Patient plans to get repeat DEXA scan with her PCP.  History of hypothyroidism  History of kidney stones  History of anemia  Pedal edema  History of anxiety  History of migraine  Orders: Orders Placed This Encounter   Procedures   Trigger Point Inj   No orders of the defined types were placed in this encounter.   Face-to-face time spent with patient was over 30 minutes. Greater than 50% of time was spent in counseling and coordination of care.  Follow-Up Instructions: Return in about 6 months (around 04/17/2024) for Rheumatoid arthritis, Osteoarthritis.   Maya Nash, MD  Note - This record has been created using Animal [employer].  Chart creation errors have been sought, but may not always  have been located. Such creation errors do not reflect on  the standard of medical care.

## 2023-10-16 ENCOUNTER — Ambulatory Visit: Attending: Rheumatology | Admitting: Rheumatology

## 2023-10-16 ENCOUNTER — Encounter: Payer: Self-pay | Admitting: Rheumatology

## 2023-10-16 VITALS — BP 126/81 | HR 61 | Resp 13 | Ht 65.5 in | Wt 117.4 lb

## 2023-10-16 DIAGNOSIS — E559 Vitamin D deficiency, unspecified: Secondary | ICD-10-CM | POA: Insufficient documentation

## 2023-10-16 DIAGNOSIS — M25531 Pain in right wrist: Secondary | ICD-10-CM | POA: Insufficient documentation

## 2023-10-16 DIAGNOSIS — M25562 Pain in left knee: Secondary | ICD-10-CM | POA: Insufficient documentation

## 2023-10-16 DIAGNOSIS — M810 Age-related osteoporosis without current pathological fracture: Secondary | ICD-10-CM | POA: Insufficient documentation

## 2023-10-16 DIAGNOSIS — Z8669 Personal history of other diseases of the nervous system and sense organs: Secondary | ICD-10-CM | POA: Insufficient documentation

## 2023-10-16 DIAGNOSIS — M5030 Other cervical disc degeneration, unspecified cervical region: Secondary | ICD-10-CM | POA: Insufficient documentation

## 2023-10-16 DIAGNOSIS — M797 Fibromyalgia: Secondary | ICD-10-CM | POA: Diagnosis not present

## 2023-10-16 DIAGNOSIS — Z862 Personal history of diseases of the blood and blood-forming organs and certain disorders involving the immune mechanism: Secondary | ICD-10-CM | POA: Insufficient documentation

## 2023-10-16 DIAGNOSIS — Z8659 Personal history of other mental and behavioral disorders: Secondary | ICD-10-CM | POA: Insufficient documentation

## 2023-10-16 DIAGNOSIS — F5101 Primary insomnia: Secondary | ICD-10-CM | POA: Diagnosis not present

## 2023-10-16 DIAGNOSIS — M19042 Primary osteoarthritis, left hand: Secondary | ICD-10-CM | POA: Insufficient documentation

## 2023-10-16 DIAGNOSIS — Z8639 Personal history of other endocrine, nutritional and metabolic disease: Secondary | ICD-10-CM | POA: Insufficient documentation

## 2023-10-16 DIAGNOSIS — R5383 Other fatigue: Secondary | ICD-10-CM | POA: Diagnosis not present

## 2023-10-16 DIAGNOSIS — M62838 Other muscle spasm: Secondary | ICD-10-CM | POA: Insufficient documentation

## 2023-10-16 DIAGNOSIS — G8929 Other chronic pain: Secondary | ICD-10-CM | POA: Diagnosis not present

## 2023-10-16 DIAGNOSIS — M0600 Rheumatoid arthritis without rheumatoid factor, unspecified site: Secondary | ICD-10-CM | POA: Insufficient documentation

## 2023-10-16 DIAGNOSIS — M3500 Sicca syndrome, unspecified: Secondary | ICD-10-CM | POA: Diagnosis not present

## 2023-10-16 DIAGNOSIS — Z79899 Other long term (current) drug therapy: Secondary | ICD-10-CM | POA: Diagnosis present

## 2023-10-16 DIAGNOSIS — M25561 Pain in right knee: Secondary | ICD-10-CM | POA: Insufficient documentation

## 2023-10-16 DIAGNOSIS — M7918 Myalgia, other site: Secondary | ICD-10-CM | POA: Diagnosis present

## 2023-10-16 DIAGNOSIS — R600 Localized edema: Secondary | ICD-10-CM | POA: Insufficient documentation

## 2023-10-16 DIAGNOSIS — M19041 Primary osteoarthritis, right hand: Secondary | ICD-10-CM | POA: Diagnosis not present

## 2023-10-16 DIAGNOSIS — Z87442 Personal history of urinary calculi: Secondary | ICD-10-CM | POA: Diagnosis not present

## 2023-10-16 MED ORDER — TRIAMCINOLONE ACETONIDE 40 MG/ML IJ SUSP
20.0000 mg | INTRAMUSCULAR | Status: AC | PRN
  Administered 2023-10-16: 20 mg via INTRAMUSCULAR

## 2023-10-16 MED ORDER — LIDOCAINE HCL 1 % IJ SOLN
0.5000 mL | INTRAMUSCULAR | Status: AC | PRN
  Administered 2023-10-16: .5 mL

## 2023-10-16 NOTE — Patient Instructions (Signed)
 Hand Exercises Hand exercises can be helpful for almost anyone. They can strengthen your hands and improve flexibility and movement. The exercises can also increase blood flow to the hands. These results can make your work and daily tasks easier for you. Hand exercises can be especially helpful for people who have joint pain from arthritis or nerve damage from using their hands over and over. These exercises can also help people who injure a hand. Exercises Most of these hand exercises are gentle stretching and motion exercises. It is usually safe to do them often throughout the day. Warming up your hands before exercise may help reduce stiffness. You can do this with gentle massage or by placing your hands in warm water for 10-15 minutes. It is normal to feel some stretching, pulling, tightness, or mild discomfort when you begin new exercises. In time, this will improve. Remember to always be careful and stop right away if you feel sudden, very bad pain or your pain gets worse. You want to get better and be safe. Ask your health care provider which exercises are safe for you. Do exercises exactly as told by your provider and adjust them as told. Do not begin these exercises until told by your provider. Knuckle bend or "claw" fist  Stand or sit with your arm, hand, and all five fingers pointed straight up. Make sure to keep your wrist straight. Gently bend your fingers down toward your palm until the tips of your fingers are touching your palm. Keep your big knuckle straight and only bend the small knuckles in your fingers. Hold this position for 10 seconds. Straighten your fingers back to your starting position. Repeat this exercise 5-10 times with each hand. Full finger fist  Stand or sit with your arm, hand, and all five fingers pointed straight up. Make sure to keep your wrist straight. Gently bend your fingers into your palm until the tips of your fingers are touching the middle of your  palm. Hold this position for 10 seconds. Extend your fingers back to your starting position, stretching every joint fully. Repeat this exercise 5-10 times with each hand. Straight fist  Stand or sit with your arm, hand, and all five fingers pointed straight up. Make sure to keep your wrist straight. Gently bend your fingers at the big knuckle, where your fingers meet your hand, and at the middle knuckle. Keep the knuckle at the tips of your fingers straight and try to touch the bottom of your palm. Hold this position for 10 seconds. Extend your fingers back to your starting position, stretching every joint fully. Repeat this exercise 5-10 times with each hand. Tabletop  Stand or sit with your arm, hand, and all five fingers pointed straight up. Make sure to keep your wrist straight. Gently bend your fingers at the big knuckle, where your fingers meet your hand, as far down as you can. Keep the small knuckles in your fingers straight. Think of forming a tabletop with your fingers. Hold this position for 10 seconds. Extend your fingers back to your starting position, stretching every joint fully. Repeat this exercise 5-10 times with each hand. Finger spread  Place your hand flat on a table with your palm facing down. Make sure your wrist stays straight. Spread your fingers and thumb apart from each other as far as you can until you feel a gentle stretch. Hold this position for 10 seconds. Bring your fingers and thumb tight together again. Hold this position for 10 seconds. Repeat  this exercise 5-10 times with each hand. Making circles  Stand or sit with your arm, hand, and all five fingers pointed straight up. Make sure to keep your wrist straight. Make a circle by touching the tip of your thumb to the tip of your index finger. Hold for 10 seconds. Then open your hand wide. Repeat this motion with your thumb and each of your fingers. Repeat this exercise 5-10 times with each hand. Thumb  motion  Sit with your forearm resting on a table and your wrist straight. Your thumb should be facing up toward the ceiling. Keep your fingers relaxed as you move your thumb. Lift your thumb up as high as you can toward the ceiling. Hold for 10 seconds. Bend your thumb across your palm as far as you can, reaching the tip of your thumb for the small finger (pinkie) side of your palm. Hold for 10 seconds. Repeat this exercise 5-10 times with each hand. Grip strengthening  Hold a stress ball or other soft ball in the middle of your hand. Slowly increase the pressure, squeezing the ball as much as you can without causing pain. Think of bringing the tips of your fingers into the middle of your palm. All of your finger joints should bend when doing this exercise. Hold your squeeze for 10 seconds, then relax. Repeat this exercise 5-10 times with each hand. Contact a health care provider if: Your hand pain or discomfort gets much worse when you do an exercise. Your hand pain or discomfort does not improve within 2 hours after you exercise. If you have either of these problems, stop doing these exercises right away. Do not do them again unless your provider says that you can. Get help right away if: You develop sudden, severe hand pain or swelling. If this happens, stop doing these exercises right away. Do not do them again unless your provider says that you can. This information is not intended to replace advice given to you by your health care provider. Make sure you discuss any questions you have with your health care provider. Document Revised: 04/03/2022 Document Reviewed: 04/03/2022 Elsevier Patient Education  2024 [employer] Exercises for Chronic Knee Pain Chronic knee pain is pain that lasts longer than 3 months. For most people with chronic knee pain, exercise and weight loss is an important part of treatment. Your health care provider may want you to focus on: Making the muscles that  support your knee stronger. This can take pressure off your knee and reduce pain. Preventing knee stiffness. How far you can move your knee, keeping it there or making it farther. Losing weight (if this applies) to take pressure off your knee, lower your risk for injury, and make it easier for you to exercise. Your provider will help you make an exercise program that fits your needs and physical abilities. Below are simple, low-impact exercises you can do at home. Ask your provider or physical therapist how often you should do your exercise program and how many times to repeat each exercise. General safety tips  Get your provider's approval before doing any exercises. Start slowly and stop any time you feel pain. Do not exercise if your knee pain is flaring up. Warm up first. Stretching a cold muscle can cause an injury. Do 5-10 minutes of easy movement or light stretching before beginning your exercises. Do 5-10 minutes of low-impact activity (like walking or cycling) before starting strengthening exercises. Contact your provider any time you have pain during  or after exercising. Exercise can cause discomfort but should not be painful. It is normal to be a little stiff or sore after exercising. Stretching and range-of-motion exercises Front thigh stretch  Stand up straight and support your body by holding on to a chair or resting one hand on a wall. With your legs straight and close together, bend one knee to lift your heel up toward your butt. Using one hand for support, grab your ankle with your free hand. Pull your foot up closer toward your butt to feel the stretch in front of your thigh. Hold the stretch for 30 seconds. Repeat __________ times. Complete this exercise __________ times a day. Back thigh stretch  Sit on the floor with your back straight and your legs out straight in front of you. Place the palms of your hands on the floor and slide them toward your feet as you bend at the  hip. Try to touch your nose to your knees and feel the stretch in the back of your thighs. Hold for 30 seconds. Repeat __________ times. Complete this exercise __________ times a day. Calf stretch  Stand facing a wall. Place the palms of your hands flat against the wall, arms extended, and lean slightly against the wall. Get into a lunge position with one leg bent at the knee and the other leg stretched out straight behind you. Keep both feet facing the wall and increase the bend in your knee while keeping the heel of the other leg flat on the ground. You should feel the stretch in your calf. Hold for 30 seconds. Repeat __________ times. Complete this exercise __________ times a day. Strengthening exercises Straight leg lift  Lie on your back with one knee bent and the other leg out straight. Slowly lift the straight leg without bending the knee. Lift until your foot is about 12 inches (30 cm) off the floor. Hold for 3-5 seconds and slowly lower your leg. Repeat __________ times. Complete this exercise __________ times a day. Single leg dip  Stand between two chairs and put both hands on the backs of the chairs for support. Extend one leg out straight with your body weight resting on the heel of the standing leg. Slowly bend your standing knee to dip your body to the level that is comfortable for you. Hold for 3-5 seconds. Repeat __________ times. Complete this exercise __________ times a day. Hamstring curls  Stand straight, knees close together, facing the back of a chair. Hold on to the back of a chair with both hands. Keep one leg straight. Bend the other knee while bringing the heel up toward the butt until the knee is bent at a 90-degree angle (right angle). Hold for 3-5 seconds. Repeat __________ times. Complete this exercise __________ times a day. Wall squat  Stand straight with your back, hips, and head against a wall. Step forward one foot at a time with your back  still against the wall. Your feet should be 2 feet (61 cm) from the wall at shoulder width. Keeping your back, hips, and head against the wall, slide down the wall to as close to a sitting position as you can get. Hold for 5-10 seconds, then slowly slide back up. Repeat __________ times. Complete this exercise __________ times a day. Step-ups  Stand in front of a sturdy platform or stool that is about 6 inches (15 cm) high. Slowly step up with your left / right foot, keeping your knee in line with your hip  and foot. Do not let your knee bend so far that you cannot see your toes. Hold on to a chair for balance, but do not use it for support. Slowly unlock your knee and lower yourself to the starting position. Repeat __________ times. Complete this exercise __________ times a day. Contact a health care provider if: Your exercises cause pain. Your pain is worse after you exercise. Your pain prevents you from doing your exercises. This information is not intended to replace advice given to you by your health care provider. Make sure you discuss any questions you have with your health care provider. Document Revised: 04/03/2022 Document Reviewed: 04/03/2022 Elsevier Patient Education  2024 [employer].

## 2024-03-19 DIAGNOSIS — D499 Neoplasm of unspecified behavior of unspecified site: Secondary | ICD-10-CM

## 2024-03-19 HISTORY — DX: Neoplasm of unspecified behavior of unspecified site: D49.9

## 2024-04-06 NOTE — Progress Notes (Signed)
 "  Office Visit Note  Patient: Danielle Byrd             Date of Birth: 1955-07-25           MRN: [id number]             PCP: Lawrance Handing, MD Referring: Lawrance Handing, MD Visit Date: 04/15/2024 Occupation: Data Unavailable  Subjective:  Joint stiffness  History of Present Illness: Danielle Byrd is a 69 y.o. female with seronegative rheumatoid arthritis and fibromyalgia syndrome.  She returns today after her last visit in July 2025.  She continues to have bilateral trapezius spasm.  She states it radiates into her shoulders.  She also has discomfort in her wrist joints.  She continues to have dry mouth and dry eye symptoms.  She denies discomfort in her knee joints.  She continues to have flares of fibromyalgia.  She has intermittent insomnia.  She continues to take trazodone.    Activities of Daily Living:  Patient reports morning stiffness for 45 minutes.   Patient Reports nocturnal pain.  Difficulty dressing/grooming: Reports Difficulty climbing stairs: Reports Difficulty getting out of chair: Reports Difficulty using hands for taps, buttons, cutlery, and/or writing: Reports  Review of Systems  Constitutional:  Positive for fatigue.  HENT:  Positive for mouth dryness. Negative for mouth sores.   Eyes:  Positive for dryness.  Respiratory:  Negative for shortness of breath.   Cardiovascular:  Negative for chest pain and palpitations.  Gastrointestinal:  Positive for constipation. Negative for blood in stool and diarrhea.  Endocrine: Negative for increased urination.  Genitourinary:  Negative for involuntary urination.  Musculoskeletal:  Positive for joint pain, gait problem, joint pain, joint swelling, muscle weakness and morning stiffness. Negative for myalgias, muscle tenderness and myalgias.  Skin:  Negative for color change, rash, hair loss and sensitivity to sunlight.  Allergic/Immunologic: Negative for susceptible to infections.  Neurological:  Positive for  dizziness and headaches.  Hematological:  Negative for swollen glands.  Psychiatric/Behavioral:  Positive for depressed mood and sleep disturbance. The patient is nervous/anxious.     PMFS History:  Patient Active Problem List   Diagnosis Date Noted   Bilateral hand pain 01/26/2021   Ganglion cyst of volar aspect of right wrist 01/26/2021   Left thigh pain 05/05/2019   Osteoarthritis of lumbar spine 05/05/2019   Hx of colonic polyps 09/05/2017   Family hx of colon cancer 09/05/2017   Migraine without aura and without status migrainosus, not intractable 03/04/2017   Fibromyalgia 11/22/2016   Other fatigue 11/22/2016   DDD (degenerative disc disease), cervical 11/22/2016   Primary osteoarthritis of both hands 11/22/2016   History of migraine 11/22/2016   History of anxiety 11/22/2016   History of hypothyroidism 11/22/2016   History of kidney stones 11/22/2016   History of cholecystectomy 11/22/2016   History of anemia 11/22/2016   Hx of bone density study/ normal 2014 this is followed by her Primary Care  11/22/2016   Sicca syndrome 11/22/2016   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   Headache 10/27/2013   Leukopenia 07/08/2013   Anemia 10/31/2011   Iron deficiency 10/31/2011   GERD (gastroesophageal reflux disease) 05/14/2011   Abdominal pain, bilateral upper quadrant 05/14/2011   H/O fibromyalgia 05/14/2011   Constipation 05/14/2011   Hyperlipemia 05/14/2011   Kidney stones 01/11/2011    Past Medical History:  Diagnosis Date   Anemia    Anxiety    Arthritis    Cervical stenosis (uterine cervix)    Closed  fracture of left foot    Constipation    Copper  deficiency    Depression    Fibromyalgia    GERD (gastroesophageal reflux disease)    High cholesterol    History of kidney stones    Hyperlipidemia    Hypothyroidism    Kidney stones    Lumbar stenosis 11/27/2016   Melanosis coli    Migraines    Nephrolithiasis    Osteopenia 12/2019   Per patient, diagnosed by  PCP [place] Reef, MD   Osteoporosis 12/2019   Per patient, diagnosed by PCP [place] Re[place], MD   Panic attack    Rosacea    Sliding hiatal hernia    Spondylosis, cervical    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 and medial nerve nodule per patient   Vertigo     Family History  Problem Relation Age of Onset   Diabetes Mother    Hypertension Mother    Anemia Mother    Colon cancer Father    Cancer Father 84       colon cancer   Heart attack Father    Past Surgical History:  Procedure Laterality Date   ABDOMINAL HYSTERECTOMY     BONE MARROW BIOPSY     & aspirate   BREAST SURGERY Right    partial mastectomy   CHOLECYSTECTOMY     COLONOSCOPY N/A 07/22/2012   Procedure: COLONOSCOPY;  Surgeon: Claudis RAYMOND Rivet, MD;  Location: [facility];  Service: Endoscopy;  Laterality: N/A;  730   COLONOSCOPY WITH PROPOFOL  N/A 10/25/2017   Procedure: COLONOSCOPY WITH PROPOFOL ;  Surgeon: Rivet Claudis RAYMOND, MD;  Location: [facility];  Service: Endoscopy;  Laterality: N/A;  10:30   DILATION AND CURETTAGE OF UTERUS     EYE SURGERY     sty removal and reconstruction of eye lids.   NISSEN FUNDOPLICATION     POLYPECTOMY  10/25/2017   Procedure: POLYPECTOMY;  Surgeon: Rivet Claudis RAYMOND, MD;  Location: [facility];  Service: Endoscopy;;  sigmoid    Social History[1] Social History   Social History Narrative   Patient lives at home with chacha (boxer, therapy dog)   Patient right handed   Patient has a BS degree.   Patient drinks 2 cups of tea daily.      There is no immunization history on file for this patient.   Objective: Vital Signs: BP (!) 167/98   Pulse 76   Temp 97.9 F (36.6 C)   Resp 13   Ht 5' 3 (1.6 m)   Wt 122 lb (55.3 kg)   BMI 21.61 kg/m    Physical Exam Vitals and nursing note reviewed.  Constitutional:      Appearance: She is well-developed.  HENT:     Head: Normocephalic and atraumatic.  Eyes:     Conjunctiva/sclera: Conjunctivae normal.   Cardiovascular:     Rate and Rhythm: Normal rate and regular rhythm.     Heart sounds: Normal heart sounds.  Pulmonary:     Effort: Pulmonary effort is normal.     Breath sounds: Normal breath sounds.  Abdominal:     General: Bowel sounds are normal.     Palpations: Abdomen is soft.  Musculoskeletal:     Cervical back: Normal range of motion.  Lymphadenopathy:     Cervical: No cervical adenopathy.  Skin:    General: Skin is warm and dry.     Capillary Refill: Capillary refill takes less than 2 seconds.  Neurological:     Mental Status: She is alert and oriented to person, place, and time.  Psychiatric:        Behavior: Behavior normal.      Musculoskeletal Exam: She has limited painful rotation of the cervical spine.  She bilateral trapezius spasm.  Thoracic kyphosis was noted.  There was no tenderness over thoracic or lumbar spine.  Shoulders, elbow joints and wrist joints in good range of motion.  She had ganglion cyst over the volar aspect of her right wrist.  She has some tenderness over the left wrist joint.  No MCP PIP or DIP swelling was noted.  Hips and knees were in good range of motion.  There was no tenderness over ankles or MTPs.  CDAI Exam: CDAI Score: 5  Patient Global: 30 / 100; Provider Global: 0 / 100 Swollen: 0 ; Tender: 3  Joint Exam 04/15/2024      Right  Left  Wrist   Tender   Tender  Cervical Spine   Tender        Investigation: No additional findings.  Imaging: No results found.  Recent Labs: Lab Results  Component Value Date   WBC 2.9 (L) 03/16/2021   HGB 11.3 (L) 03/16/2021   PLT 202 03/16/2021   NA 133 (L) 03/16/2021   K 4.1 03/16/2021   CL 96 (L) 03/16/2021   CO2 32 03/16/2021   GLUCOSE 98 03/16/2021   BUN 5 (L) 03/16/2021   CREATININE 0.68 03/16/2021   BILITOT 0.4 03/16/2021   ALKPHOS 72 10/30/2017   AST 19 03/16/2021   ALT 13 03/16/2021   PROT 5.9 (L) 03/16/2021   ALBUMIN 3.9 10/30/2017   CALCIUM 8.9 03/16/2021   GFRAA >60  10/30/2017    Speciality Comments: PL[facility] eye exam appt 10/31/2020 normal [facility] f/u 12 months Macular OCT - WNL @ annual eye exam PLQ 08/22-01/23-dizziness, ou  Procedures:  Trigger Point Inj  Date/Time: 04/15/2024 2:20 PM  Performed by: Dolphus Reiter, MD Authorized by: Dolphus Reiter, MD   Consent Given by:  Patient Site marked: the procedure site was marked   Timeout: prior to procedure the correct patient, procedure, and site was verified   Indications:  Muscle spasm and pain Total # of Trigger Points:  2 Location: neck   Needle Size:  27 G Approach:  Dorsal Medications #1:  0.5 mL lidocaine  1 %; 20 mg triamcinolone  acetonide 40 MG/ML Medications #2:  0.5 mL lidocaine  1 %; 20 mg triamcinolone  acetonide 40 MG/ML Patient tolerance:  Patient tolerated the procedure well with no immediate complications Comments: Risk of infection, tendon injury, nerve injury, dermal atrophy and hypopigmentation were discussed.  Allergies: Oxycodone , Plaquenil  [hydroxychloroquine ], Reglan  [metoclopramide ], Tramadol  hcl, Codeine, Cymbalta [duloxetine hcl], Egg protein-containing drug products, Gabapentin, Hydrocodone-acetaminophen , Penicillins, Savella [milnacipran hcl], Savella [milnacipran], Statins, Voltaren  [diclofenac  sodium], Zanaflex [tizanidine hcl], and Lyrica [pregabalin]   Assessment / Plan:     Visit Diagnoses: Seronegative rheumatoid arthritis (HCC) - She had been off hydroxychloroquine  since January 2023.  She tried Plaquenil  for few months and discontinued due to dizziness and oral ulcers.  She continues to have some joint stiffness and intermittent discomfort.  No synovitis was noted on the examination.  I offered ultrasound to look for synovitis but patient declined.  I also offered labs to look for inflammation.  She wanted me to review recent labs done by her PCP.  Labs from epic everywhere were reviewed.  February 13, 2024 WBC count 3.1, hemoglobin 10.5 with  elevated MCV.  CMP was unremarkable.  Patient states that she had hematology workup in the past including a bone marrow which was unremarkable.  High risk medication use - Plaquenil  discontinued in January 2023 due to dizziness.  Sicca syndrome-she continues to have dry mouth and dry eye symptoms.  Over-the-counter products were discussed.  Primary osteoarthritis of both hands-she complains of pain and discomfort in the bilateral hands.  She has no synovitis on the examination.  She had ganglion cyst over the volar aspect of her right wrist which is unchanged.  Pain in right wrist - Right wrist joint was injected March 21, 2023 under ultrasound guidance.  She noted improvement after the injection.  Chronic pain of both knees-no warmth swelling or effusion was noted.  DDD (degenerative disc disease), cervical - Multilevel spondylosis and spurring was noted.  Trapezius muscle spasm-requested bilateral trapezius injections.  After informed consent was obtained and side effects were discussed bilateral trapezius region was injected as described above.  Fibromyalgia-continues to have generalized pain and discomfort from fibromyalgia.  Need for regular exercise and stretching was discussed.  Other fatiguerelated to fibromyalgia and insomnia.    Primary insomnia - She takes trazodone as needed.  Vitamin D deficiency  Age-related osteoporosis without current pathological fracture - December 14, 2019 DEXA scan: [facility] spine BMD 0.780, T-score -2.4.  Followed by her PCP.  Fosamax was discontinued due to side effects.  Patient had a recent DEXA scan in 2024 but they cannot read the results.  Advised patient to bring the copy with her next visit.  Other medical problems are listed as follows:  History of hypothyroidism  History of kidney stones  History of anemia  Pedal edema  History of migraine  History of anxiety  Orders: No orders of the defined types were placed in this  encounter.  No orders of the defined types were placed in this encounter.    Follow-Up Instructions: Return in about 6 months (around 10/13/2024) for Rheumatoid arthritis, Osteoarthritis.   Maya Nash, MD  Note - This record has been created using Animal nutritionist.  Chart creation errors have been sought, but may not always  have been located. Such creation errors do not reflect on  the standard of medical care.     [1]  Social History Tobacco Use   Smoking status: Never    Passive exposure: Never   Smokeless tobacco: Never  Vaping Use   Vaping status: Never Used  Substance Use Topics   Alcohol use: No    Alcohol/week: 0.0 standard drinks of alcohol   Drug use: No   "

## 2024-04-15 ENCOUNTER — Ambulatory Visit: Attending: Rheumatology | Admitting: Rheumatology

## 2024-04-15 ENCOUNTER — Encounter: Payer: Self-pay | Admitting: Rheumatology

## 2024-04-15 VITALS — BP 167/98 | HR 76 | Temp 97.9°F | Resp 13 | Ht 63.0 in | Wt 122.0 lb

## 2024-04-15 DIAGNOSIS — M25562 Pain in left knee: Secondary | ICD-10-CM | POA: Insufficient documentation

## 2024-04-15 DIAGNOSIS — Z79899 Other long term (current) drug therapy: Secondary | ICD-10-CM | POA: Insufficient documentation

## 2024-04-15 DIAGNOSIS — M19041 Primary osteoarthritis, right hand: Secondary | ICD-10-CM | POA: Diagnosis not present

## 2024-04-15 DIAGNOSIS — Z8659 Personal history of other mental and behavioral disorders: Secondary | ICD-10-CM | POA: Diagnosis not present

## 2024-04-15 DIAGNOSIS — M5030 Other cervical disc degeneration, unspecified cervical region: Secondary | ICD-10-CM | POA: Insufficient documentation

## 2024-04-15 DIAGNOSIS — Z87442 Personal history of urinary calculi: Secondary | ICD-10-CM | POA: Diagnosis not present

## 2024-04-15 DIAGNOSIS — Z8669 Personal history of other diseases of the nervous system and sense organs: Secondary | ICD-10-CM | POA: Diagnosis not present

## 2024-04-15 DIAGNOSIS — M797 Fibromyalgia: Secondary | ICD-10-CM | POA: Diagnosis not present

## 2024-04-15 DIAGNOSIS — R600 Localized edema: Secondary | ICD-10-CM | POA: Insufficient documentation

## 2024-04-15 DIAGNOSIS — Z862 Personal history of diseases of the blood and blood-forming organs and certain disorders involving the immune mechanism: Secondary | ICD-10-CM | POA: Diagnosis not present

## 2024-04-15 DIAGNOSIS — M25561 Pain in right knee: Secondary | ICD-10-CM | POA: Diagnosis not present

## 2024-04-15 DIAGNOSIS — Z8639 Personal history of other endocrine, nutritional and metabolic disease: Secondary | ICD-10-CM | POA: Diagnosis not present

## 2024-04-15 DIAGNOSIS — F5101 Primary insomnia: Secondary | ICD-10-CM | POA: Diagnosis not present

## 2024-04-15 DIAGNOSIS — M62838 Other muscle spasm: Secondary | ICD-10-CM | POA: Insufficient documentation

## 2024-04-15 DIAGNOSIS — E559 Vitamin D deficiency, unspecified: Secondary | ICD-10-CM | POA: Diagnosis not present

## 2024-04-15 DIAGNOSIS — M19042 Primary osteoarthritis, left hand: Secondary | ICD-10-CM | POA: Insufficient documentation

## 2024-04-15 DIAGNOSIS — R5383 Other fatigue: Secondary | ICD-10-CM | POA: Diagnosis not present

## 2024-04-15 DIAGNOSIS — M0600 Rheumatoid arthritis without rheumatoid factor, unspecified site: Secondary | ICD-10-CM | POA: Diagnosis present

## 2024-04-15 DIAGNOSIS — M810 Age-related osteoporosis without current pathological fracture: Secondary | ICD-10-CM | POA: Insufficient documentation

## 2024-04-15 DIAGNOSIS — M25531 Pain in right wrist: Secondary | ICD-10-CM | POA: Insufficient documentation

## 2024-04-15 DIAGNOSIS — G8929 Other chronic pain: Secondary | ICD-10-CM | POA: Diagnosis not present

## 2024-04-15 DIAGNOSIS — M3500 Sicca syndrome, unspecified: Secondary | ICD-10-CM | POA: Insufficient documentation

## 2024-04-15 MED ORDER — TRIAMCINOLONE ACETONIDE 40 MG/ML IJ SUSP
20.0000 mg | INTRAMUSCULAR | Status: AC | PRN
  Administered 2024-04-15: 20 mg via INTRAMUSCULAR

## 2024-04-15 MED ORDER — LIDOCAINE HCL 1 % IJ SOLN
0.5000 mL | INTRAMUSCULAR | Status: AC | PRN
  Administered 2024-04-15: .5 mL

## 2024-10-13 ENCOUNTER — Ambulatory Visit: Admitting: Rheumatology
